# Patient Record
Sex: Female | Born: 1955
Health system: Southern US, Community
[De-identification: ages and names within clinical notes are randomized; demographics above are authoritative.]

## PROBLEM LIST (undated history)

## (undated) DIAGNOSIS — E785 Hyperlipidemia, unspecified: Secondary | ICD-10-CM

## (undated) DIAGNOSIS — I10 Essential (primary) hypertension: Secondary | ICD-10-CM

## (undated) HISTORY — DX: Hyperlipidemia, unspecified: E78.5

---

## 2000-07-03 ENCOUNTER — Emergency Department (HOSPITAL_COMMUNITY): Admission: EM | Admit: 2000-07-03 | Discharge: 2000-07-03 | Payer: Self-pay | Admitting: Emergency Medicine

## 2000-07-03 ENCOUNTER — Encounter: Payer: Self-pay | Admitting: Emergency Medicine

## 2011-08-28 ENCOUNTER — Encounter (HOSPITAL_COMMUNITY): Payer: Self-pay | Admitting: *Deleted

## 2011-08-28 ENCOUNTER — Inpatient Hospital Stay (HOSPITAL_COMMUNITY)
Admission: EM | Admit: 2011-08-28 | Discharge: 2011-08-31 | DRG: 641 | Disposition: A | Discharge status: Home / Self Care | Payer: MEDICAID | Attending: Internal Medicine | Admitting: Internal Medicine

## 2011-08-28 ENCOUNTER — Emergency Department (HOSPITAL_COMMUNITY): Payer: Self-pay

## 2011-08-28 DIAGNOSIS — R64 Cachexia: Secondary | ICD-10-CM | POA: Diagnosis present

## 2011-08-28 DIAGNOSIS — R000 Tachycardia, unspecified: Secondary | ICD-10-CM | POA: Diagnosis present

## 2011-08-28 DIAGNOSIS — F17200 Nicotine dependence, unspecified, uncomplicated: Secondary | ICD-10-CM | POA: Diagnosis present

## 2011-08-28 DIAGNOSIS — Z880 Allergy status to penicillin: Secondary | ICD-10-CM

## 2011-08-28 DIAGNOSIS — R55 Syncope and collapse: Secondary | ICD-10-CM | POA: Diagnosis present

## 2011-08-28 DIAGNOSIS — E876 Hypokalemia: Principal | ICD-10-CM

## 2011-08-28 DIAGNOSIS — I10 Essential (primary) hypertension: Secondary | ICD-10-CM | POA: Diagnosis present

## 2011-08-28 DIAGNOSIS — W19XXXA Unspecified fall, initial encounter: Secondary | ICD-10-CM | POA: Diagnosis present

## 2011-08-28 HISTORY — DX: Essential (primary) hypertension: I10

## 2011-08-28 LAB — CBC WITH DIFFERENTIAL/PLATELET
Basophils Absolute: 0 10*3/uL (ref 0.0–0.1)
Eosinophils Relative: 1 % (ref 0–5)
HCT: 36.6 % (ref 36.0–46.0)
Hemoglobin: 12.5 g/dL (ref 12.0–15.0)
Lymphocytes Relative: 32 % (ref 12–46)
MCV: 85.5 fL (ref 78.0–100.0)
Monocytes Absolute: 0.5 10*3/uL (ref 0.1–1.0)
Monocytes Relative: 8 % (ref 3–12)
RDW: 13.6 % (ref 11.5–15.5)
WBC: 6.4 10*3/uL (ref 4.0–10.5)

## 2011-08-28 LAB — HEPATIC FUNCTION PANEL
ALT: 17 U/L (ref 0–35)
AST: 37 U/L (ref 0–37)
Albumin: 3.4 g/dL — ABNORMAL LOW (ref 3.5–5.2)
Alkaline Phosphatase: 78 U/L (ref 39–117)
Bilirubin, Direct: 0.1 mg/dL (ref 0.0–0.3)
Total Bilirubin: 0.6 mg/dL (ref 0.3–1.2)

## 2011-08-28 LAB — RAPID URINE DRUG SCREEN, HOSP PERFORMED
Benzodiazepines: NOT DETECTED
Cocaine: NOT DETECTED

## 2011-08-28 LAB — URINALYSIS, ROUTINE W REFLEX MICROSCOPIC
Bilirubin Urine: NEGATIVE
Glucose, UA: NEGATIVE mg/dL
Hgb urine dipstick: NEGATIVE
Ketones, ur: NEGATIVE mg/dL
Nitrite: NEGATIVE
pH: 5.5 (ref 5.0–8.0)

## 2011-08-28 LAB — BASIC METABOLIC PANEL
BUN: 23 mg/dL (ref 6–23)
CO2: 23 mEq/L (ref 19–32)
Chloride: 102 mEq/L (ref 96–112)
Glucose, Bld: 84 mg/dL (ref 70–99)
Potassium: 2.8 mEq/L — ABNORMAL LOW (ref 3.5–5.1)
Sodium: 137 mEq/L (ref 135–145)

## 2011-08-28 MED ORDER — SODIUM CHLORIDE 0.9 % IV SOLN
Freq: Once | INTRAVENOUS | Status: AC
  Administered 2011-08-28: via INTRAVENOUS

## 2011-08-28 MED ORDER — POTASSIUM CHLORIDE CRYS ER 20 MEQ PO TBCR
40.0000 meq | EXTENDED_RELEASE_TABLET | Freq: Once | ORAL | Status: DC
  Filled 2011-08-28: qty 2

## 2011-08-28 MED ORDER — SODIUM CHLORIDE 0.9 % IV BOLUS (SEPSIS)
1000.0000 mL | Freq: Once | INTRAVENOUS | Status: AC
  Administered 2011-08-28: 1000 mL via INTRAVENOUS

## 2011-08-28 MED ORDER — POTASSIUM CHLORIDE 10 MEQ/100ML IV SOLN
10.0000 meq | Freq: Once | INTRAVENOUS | Status: AC
  Administered 2011-08-28: 10 meq via INTRAVENOUS
  Filled 2011-08-28: qty 100

## 2011-08-28 NOTE — ED Notes (Signed)
Pt receiving bolus, unable to urinate at this time,  Will in and out cath after bolus received if pt unable to urinate then

## 2011-08-28 NOTE — ED Notes (Signed)
Pt not able to urinate and is very lethargic.

## 2011-08-28 NOTE — ED Notes (Signed)
Report received from [facility],  Pt came in from home,  She was found passed out by her housekeeper,  Pt is alert and oriented x 4,  But very slow speech and unable to keep eyes open,

## 2011-08-28 NOTE — ED Notes (Signed)
Pt reports feeling generally unwell. States she had a syncopal episode. Patient sleeping intermittently and complains of right leg pain. Pt states leg seems to give out on her. Pt denies taking any medications for the pain.

## 2011-08-28 NOTE — ED Notes (Signed)
UJW:[id number]<YN> Expected date:08/28/11<BR> Expected time: 6:52 PM<BR> Means of arrival:<BR> Comments:<BR> Hold for pt in Res A

## 2011-08-28 NOTE — ED Notes (Signed)
MD is aware pt in room and ready to be seen

## 2011-08-28 NOTE — ED Notes (Signed)
Awaiting CT head results before orthostatics attempted,  Pt is very sleepy but arousable

## 2011-08-29 ENCOUNTER — Encounter (HOSPITAL_COMMUNITY): Payer: Self-pay | Admitting: *Deleted

## 2011-08-29 DIAGNOSIS — E876 Hypokalemia: Principal | ICD-10-CM

## 2011-08-29 DIAGNOSIS — R55 Syncope and collapse: Secondary | ICD-10-CM

## 2011-08-29 LAB — CBC
Hemoglobin: 10.7 g/dL — ABNORMAL LOW (ref 12.0–15.0)
MCH: 29.1 pg (ref 26.0–34.0)
MCHC: 33.2 g/dL (ref 30.0–36.0)
RDW: 13.8 % (ref 11.5–15.5)

## 2011-08-29 LAB — TSH: TSH: 0.364 u[IU]/mL (ref 0.350–4.500)

## 2011-08-29 LAB — CARDIAC PANEL(CRET KIN+CKTOT+MB+TROPI)
CK, MB: 7.9 ng/mL (ref 0.3–4.0)
CK, MB: 4.5 ng/mL — ABNORMAL HIGH (ref 0.3–4.0)
Relative Index: 0.9 (ref 0.0–2.5)
Relative Index: 0.9 (ref 0.0–2.5)
Total CK: 831 U/L — ABNORMAL HIGH (ref 7–177)
Total CK: 678 U/L — ABNORMAL HIGH (ref 7–177)
Total CK: 516 U/L — ABNORMAL HIGH (ref 7–177)
Troponin I: <0.3 ng/mL (ref <0.30)

## 2011-08-29 LAB — BASIC METABOLIC PANEL
BUN: 15 mg/dL (ref 6–23)
Creatinine, Ser: 0.77 mg/dL (ref 0.50–1.10)
GFR calc Af Amer: >90 mL/min (ref >90)
GFR calc non Af Amer: >90 mL/min (ref >90)
Glucose, Bld: 106 mg/dL — ABNORMAL HIGH (ref 70–99)
Potassium: 4 mEq/L (ref 3.5–5.1)

## 2011-08-29 LAB — HEMOGLOBIN A1C: Hgb A1c MFr Bld: 5.9 % — ABNORMAL HIGH (ref <5.7)

## 2011-08-29 LAB — PHOSPHORUS: Phosphorus: 3 mg/dL (ref 2.3–4.6)

## 2011-08-29 LAB — GLUCOSE, CAPILLARY: Glucose-Capillary: 96 mg/dL (ref 70–99)

## 2011-08-29 LAB — MAGNESIUM: Magnesium: 2 mg/dL (ref 1.5–2.5)

## 2011-08-29 MED ORDER — SODIUM CHLORIDE 0.9 % IV SOLN: INTRAVENOUS | Status: DC

## 2011-08-29 MED ORDER — SODIUM CHLORIDE 0.9 % IJ SOLN
3.0000 mL | Freq: Two times a day (BID) | INTRAMUSCULAR | Status: DC
  Administered 2011-08-29 – 2011-08-31 (×4): 3 mL via INTRAVENOUS

## 2011-08-29 MED ORDER — POTASSIUM CHLORIDE 20 MEQ/15ML (10%) PO LIQD
60.0000 meq | Freq: Once | ORAL | Status: AC
  Administered 2011-08-29: 60 meq via ORAL
  Filled 2011-08-29: qty 45

## 2011-08-29 MED ORDER — HYDROCODONE-ACETAMINOPHEN 5-325 MG PO TABS: 1.0000 | ORAL_TABLET | ORAL | Status: DC | PRN

## 2011-08-29 MED ORDER — ENOXAPARIN SODIUM 30 MG/0.3ML ~~LOC~~ SOLN
30.0000 mg | SUBCUTANEOUS | Status: DC
  Administered 2011-08-29 – 2011-08-31 (×3): 30 mg via SUBCUTANEOUS
  Filled 2011-08-29 (×3): qty 0.3

## 2011-08-29 MED ORDER — POTASSIUM CHLORIDE CRYS ER 20 MEQ PO TBCR
60.0000 meq | EXTENDED_RELEASE_TABLET | Freq: Once | ORAL | Status: DC
  Filled 2011-08-29: qty 3

## 2011-08-29 MED ORDER — ONDANSETRON HCL 4 MG PO TABS: 4.0000 mg | ORAL_TABLET | Freq: Four times a day (QID) | ORAL | Status: DC | PRN

## 2011-08-29 MED ORDER — MORPHINE SULFATE 2 MG/ML IJ SOLN: 1.0000 mg | INTRAMUSCULAR | Status: DC | PRN

## 2011-08-29 MED ORDER — ONDANSETRON HCL 4 MG/2ML IJ SOLN: 4.0000 mg | Freq: Four times a day (QID) | INTRAMUSCULAR | Status: DC | PRN

## 2011-08-29 NOTE — Progress Notes (Signed)
VASCULAR LAB PRELIMINARY  PRELIMINARY  PRELIMINARY  PRELIMINARY  Carotid Dopplers completed.    Preliminary report:  There is no ICA stenosis.  Vertebral artery flow is antegrade.  Dorothy Polhemus, 08/29/2011, 12:59 PM

## 2011-08-29 NOTE — ED Notes (Signed)
Lab called to report CKMB 7.7

## 2011-08-29 NOTE — ED Provider Notes (Addendum)
History     CSN: [id number]  Arrival date & time 08/28/11  1843   First MD Initiated Contact with Patient 08/28/11 1926      Chief Complaint  Patient presents with  . Loss of Consciousness  . Leg Pain  . Fatigue    (Consider location/radiation/quality/duration/timing/severity/associated sxs/prior treatment) HPI Comments: Patient with no known medical hx comes in with cc of syncope. Pt reports to being generally weak over the past few days. She has no n/v/f/c. She has no hx of thyroid disease. She has no abd pain, diarrhea - but does have loss of appetite. Pt has no chest pain, sob, blood loss. Earlier today, patient had an un witnessed syncopal episode. She was trying to get up, and just passed out. She had no chest pain, palpitations, sob prior to or after the syncope no hx ofd seizures, and there was no incontinence. Pt has no hx of PE, DVt, and no risk factors for the same.  The history is provided by the patient and a relative.    Past Medical History  Diagnosis Date  . Hypertension     History reviewed. No pertinent past surgical history.  No family history on file.  History  Substance Use Topics  . Smoking status: Current Everyday Smoker  . Smokeless tobacco: Not on file  . Alcohol Use: No    OB History    Grav Para Term Preterm Abortions TAB SAB Ect Mult Living                  Review of Systems  Constitutional: Positive for appetite change and fatigue. Negative for fever, diaphoresis, activity change and unexpected weight change.  HENT: Negative for facial swelling and neck pain.   Respiratory: Negative for cough, shortness of breath and wheezing.   Cardiovascular: Negative for chest pain.  Gastrointestinal: Negative for nausea, vomiting, abdominal pain, diarrhea, constipation, blood in stool and abdominal distention.  Genitourinary: Negative for dysuria, hematuria and difficulty urinating.  Musculoskeletal: Negative for arthralgias.  Skin: Negative for  color change.  Neurological: Positive for syncope, weakness and light-headedness. Negative for speech difficulty.  Hematological: Does not bruise/bleed easily.  Psychiatric/Behavioral: Negative for confusion.    Allergies  Penicillins  Home Medications  No current outpatient prescriptions on file.  BP 132/91  Pulse 92  Temp 99.8 F (37.7 C) (Oral)  Resp 19  SpO2 100%  Physical Exam  Constitutional: She is oriented to person, place, and time. She appears well-developed.  HENT:  Head: Normocephalic and atraumatic.  Eyes: Conjunctivae and EOM are normal. Pupils are equal, round, and reactive to light.  Neck: Normal range of motion. Neck supple.  Cardiovascular: Normal rate, regular rhythm and normal heart sounds.   Pulmonary/Chest: Effort normal and breath sounds normal. No respiratory distress.  Abdominal: Soft. Bowel sounds are normal. She exhibits no distension. There is no tenderness. There is no rebound and no guarding.  Neurological: She is alert and oriented to person, place, and time.  Skin: Skin is warm and dry.    ED Course  Procedures (including critical care time)  Labs Reviewed  URINE RAPID DRUG SCREEN (HOSP PERFORMED) - Abnormal; Notable for the following:    Tetrahydrocannabinol POSITIVE (*)     All other components within normal limits  BASIC METABOLIC PANEL - Abnormal; Notable for the following:    Potassium 2.8 (*)     GFR calc non Af Amer 58 (*)     GFR calc Af Amer 67 (*)  All other components within normal limits  HEPATIC FUNCTION PANEL - Abnormal; Notable for the following:    Albumin 3.4 (*)     All other components within normal limits  SALICYLATE LEVEL - Abnormal; Notable for the following:    Salicylate Lvl <2.0 (*)     All other components within normal limits  CBC WITH DIFFERENTIAL  URINALYSIS, ROUTINE W REFLEX MICROSCOPIC  ACETAMINOPHEN LEVEL  TROPONIN I  D-DIMER, QUANTITATIVE   Dg Chest 2 View  08/28/2011  *RADIOLOGY REPORT*   Clinical Data: Chest pain.  CHEST - 2 VIEW  Comparison: None.  Findings: Mild cardiomegaly.  Clear lungs.  Normal vascularity.  No pneumothorax and no pleural effusion.  IMPRESSION: Cardiomegaly without edema.  Original Report Authenticated By: Donavan Burnet, M.D.   Ct Head Wo Contrast  08/28/2011  *RADIOLOGY REPORT*  Clinical Data: 56 year old female with loss of consciousness, syncope and speech disturbance.  CT HEAD WITHOUT CONTRAST  Technique:  Contiguous axial images were obtained from the base of the skull through the vertex without contrast.  Comparison: None  Findings: No intracranial abnormalities are identified, including mass lesion or mass effect, hydrocephalus, extra-axial fluid collection, midline shift, hemorrhage, or acute infarction.  The visualized bony calvarium is unremarkable.  IMPRESSION: Unremarkable noncontrast head CT  Original Report Authenticated By: Rosendo Gros, M.D.     No diagnosis found.    MDM   Date: 08/29/2011  Rate: 95  Rhythm: normal sinus rhythm  QRS Axis: normal  Intervals: normal  ST/T Wave abnormalities: normal  Conduction Disutrbances:none  Narrative Interpretation:   Old EKG Reviewed: none available  Patient with no medical hx comes in with cc of syncope. Pt had a syncopal episode earlier today, un witnessed. EKG shows LBH, and some non specific T wave changes, but nothing acute. Pt appears to be cachectic, and labile. She states that she was doing well until just recently. We will get basic labs. Wewill get d-dimer as well for PE. Will consider admission based on the findings.   1:19 AM Patient's potassium is 2.8. She has no other lab abnormality. EKG - no u waves, but there might be some suttle deflection buried with t waves.... With syncope and hypokalemia, and general cachectic appearance - we will try to admit.         Derwood Kaplan, MD 08/29/11 1610  Derwood Kaplan, MD 08/29/11 9604

## 2011-08-29 NOTE — Progress Notes (Signed)
Attempted to returns sister's call. Permission to speak with her from pt. No answer.

## 2011-08-29 NOTE — ED Notes (Signed)
Patient given Potassium mixed in [place] Juice. Patient took one sip and stated "I can't drank that."  Encouraged patient to try to drink mixture to facilitate potassium replacement. Patient became tearful stating that she just cannot drink it.

## 2011-08-29 NOTE — H&P (Signed)
[facility] History and Physical  Raven Buckley MVH:[id number] DOB: [birth year] DOA: 08/28/2011  Referring physician: ED physician PCP: No primary provider on file.   Chief Complaint: Fall  HPI:  Pt is 56 yo female with no known significant past medical history who presents today to [facility] with main concern of one episode of "passing out" earlier today. She denies similar episodes in the past but family reports she has had similar things happen to her but they are unsure of the details. Pt denies any known provoking symptoms, no chest pain or shortness of breath, no dizziness, no headaches, no focal neurologic weakness. Pt denies recent sicknesses or hospitalizations, no other systemic symptoms of weight loss, night sweats.  Assessment and Plan: Active Problems:  Syncope - unclear etiology at this time - will admit the pt to telemetry floor for further evaluation and management - will obtain CE's, TSH, orthostatic vitals, monitor on tele - provide supportive care with IVF, analgesia as needed for symptom control - monitor vitals per floor protocol - PT evaluation   Hypokalemia - unclear etiology at this time - pt denies any diarrhea and is not taking any medications - will check Mg level - supplemented in ED - BMP in AM  Code Status: Full Family Communication: Pt at bedside Disposition Plan: PT evaluation    Review of Systems:  Constitutional: Negative for fever, chills and malaise/fatigue. Negative for diaphoresis.  HENT: Negative for hearing loss, ear pain, nosebleeds, congestion, sore throat, neck pain, tinnitus and ear discharge.   Eyes: Negative for blurred vision, double vision, photophobia, pain, discharge and redness.  Respiratory: Negative for cough, hemoptysis, sputum production, shortness of breath, wheezing and stridor.   Cardiovascular: Negative for chest pain, palpitations, orthopnea, claudication and leg swelling.  Gastrointestinal: Negative for nausea,  vomiting and abdominal pain. Negative for heartburn, constipation, blood in stool and melena.  Genitourinary: Negative for dysuria, urgency, frequency, hematuria and flank pain.  Musculoskeletal: Negative for myalgias, back pain, joint pain, positive for fall.  Skin: Negative for itching and rash.  Neurological: Negative for dizziness and weakness. Negative for tingling, tremors, sensory change, speech change, focal weakness, loss of consciousness and headaches.  Endo/Heme/Allergies: Negative for environmental allergies and polydipsia. Does not bruise/bleed easily.  Psychiatric/Behavioral: Negative for suicidal ideas. The patient is not nervous/anxious.      Past Medical History  Diagnosis Date  . Hypertension     History reviewed. No pertinent past surgical history.  Social History:  reports that she has been smoking.  She does not have any smokeless tobacco history on file. She reports that she uses illicit drugs (Marijuana). She reports that she does not drink alcohol.  Allergies  Allergen Reactions  . Penicillins Rash    No history of heart or lung problems, no cancers in family   Prior to Admission medications   Not on File    Physical Exam: Filed Vitals:   08/28/11 1853 08/28/11 2131 08/28/11 2132  BP: 125/66 129/83 132/91  Pulse: 103 87 92  Temp: 99.5 F (37.5 C) 99.8 F (37.7 C)   TempSrc: Oral Oral   Resp: 37 19   SpO2: 99% 100%     Physical Exam  Constitutional: Appears cachectic. No distress.  HENT: Normocephalic. External right and left ear normal. Oropharynx is clear and moist.  Eyes: Conjunctivae and EOM are normal. PERRLA, no scleral icterus.  Neck: Normal ROM. Neck supple. No JVD. No tracheal deviation. No thyromegaly.  CVS: Regular rhythm, tachycardic, S1/S2 +, no  murmurs, no gallops, no carotid bruit.  Pulmonary: Effort and breath sounds normal, no stridor, rhonchi, wheezes, rales.  Abdominal: Soft. BS +,  no distension, tenderness, rebound or  guarding.  Musculoskeletal: Normal range of motion. No edema and no tenderness.  Lymphadenopathy: No lymphadenopathy noted, cervical, inguinal. Neuro: Alert. Normal reflexes, muscle tone coordination. No cranial nerve deficit. Skin: Skin is warm and dry. No rash noted. Not diaphoretic. No erythema. No pallor.  Psychiatric: Normal mood and affect. Behavior, judgment, thought content normal.   Labs on Admission:  Basic Metabolic Panel:  Lab 08/28/11 4098  NA 137  K 2.8*  CL 102  CO2 23  GLUCOSE 84  BUN 23  CREATININE 1.06  CALCIUM 8.8  MG --  PHOS --   Liver Function Tests:  Lab 08/28/11 2126  AST 37  ALT 17  ALKPHOS 78  BILITOT 0.6  PROT 6.4  ALBUMIN 3.4*   CBC:  Lab 08/28/11 1857  WBC 6.4  NEUTROABS 3.8  HGB 12.5  HCT 36.6  MCV 85.5  PLT 354   Cardiac Enzymes:  Lab 08/28/11 2126  CKTOTAL --  CKMB --  CKMBINDEX --  TROPONINI <0.30    Radiological Exams on Admission: Dg Chest 2 View  08/28/2011  *RADIOLOGY REPORT*  Clinical Data: Chest pain.  CHEST - 2 VIEW  Comparison: None.  Findings: Mild cardiomegaly.  Clear lungs.  Normal vascularity.  No pneumothorax and no pleural effusion.  IMPRESSION: Cardiomegaly without edema.  Original Report Authenticated By: Donavan Burnet, M.D.   Ct Head Wo Contrast  08/28/2011  *RADIOLOGY REPORT*  Clinical Data: 56 year old female with loss of consciousness, syncope and speech disturbance.  CT HEAD WITHOUT CONTRAST  Technique:  Contiguous axial images were obtained from the base of the skull through the vertex without contrast.  Comparison: None  Findings: No intracranial abnormalities are identified, including mass lesion or mass effect, hydrocephalus, extra-axial fluid collection, midline shift, hemorrhage, or acute infarction.  The visualized bony calvarium is unremarkable.  IMPRESSION: Unremarkable noncontrast head CT  Original Report Authenticated By: Rosendo Gros, M.D.    EKG: Normal sinus rhythm, no ST/T wave  changes  Debbora Presto, MD  [facility] Pager [phone]  If 7PM-7AM, please contact night-coverage [email or web address] Password [email or web address] 08/29/2011, 2:01 AM

## 2011-08-29 NOTE — Progress Notes (Signed)
I have seen and examined patient admitted this a.m. per Dr. Izola Price with no known significant past medical history who presented with complaints of syncopal episode/fall. She denies any dizziness or chest pain at this time. Will follow cardiac enzymes, also obtain carotid Dopplers and 2-D echocardiogram follow and further manage accordingly.  Roanna Epley [facility] [phone]

## 2011-08-30 NOTE — Evaluation (Signed)
Physical Therapy One Time Evaluation and D/C from acute PT Patient Details Name: Raven Buckley MRN: [id number] DOB: 1955/12/19 Today's Date: 08/30/2011 Time: 0950-1007 PT Time Calculation (min): 17 min  PT Assessment / Plan / Recommendation Clinical Impression  Pt admitted for syncope.  Pt denied dizziness and lightheadedness with all transfers and mobility.  Orthostatics taken and entered in doc flowsheets (BP did not drop).  Pt appears to be at baseline and no further PT needs identified at this time.    PT Assessment  Patent does not need any further PT services    Follow Up Recommendations  No PT follow up    Barriers to Discharge        Equipment Recommendations  None recommended by PT    Recommendations for Other Services     Frequency      Precautions / Restrictions Precautions Precautions: Fall (syncope)   Pertinent Vitals/Pain No pain Orthostatics: Supine: 131/72 mmHg HR 80 Sitting: 133/84 mmHg HR 79 Standing: 144/80 mmHg HR 90      Mobility  Bed Mobility Bed Mobility: Sit to Supine;Supine to Sit Supine to Sit: 7: Independent Sit to Supine: 7: Independent Transfers Transfers: Sit to Stand;Stand to Sit Sit to Stand: 6: Modified independent (Device/Increase time) Stand to Sit: 6: Modified independent (Device/Increase time) Ambulation/Gait Ambulation/Gait Assistance: 6: Modified independent (Device/Increase time) Ambulation Distance (Feet): 250 Feet Assistive device: None Ambulation/Gait Assistance Details: pt denies dizziness and SOB, SaO2 100% room air upon returning to room Gait Pattern: Within Functional Limits    Exercises     PT Diagnosis:    PT Problem List:   PT Treatment Interventions:     PT Goals    Visit Information  Last PT Received On: 08/30/11 Assistance Needed: +1    Subjective Data  Subjective: 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   Prior Functioning  Home Living Lives With: Alone Type of Home: House Home Access: Stairs to  enter Secretary/administrator of Steps: 4 Entrance Stairs-Rails: Right Home Layout: One level Home Adaptive Equipment: None Prior Function Level of Independence: Independent Communication Communication: No difficulties    Cognition  Overall Cognitive Status: Appears within functional limits for tasks assessed/performed Arousal/Alertness: Awake/alert Orientation Level: Appears intact for tasks assessed Behavior During Session: [facility] for tasks performed    Extremity/Trunk Assessment Right Upper Extremity Assessment RUE ROM/Strength/Tone: [facility] for tasks assessed Left Upper Extremity Assessment LUE ROM/Strength/Tone: [facility] for tasks assessed Right Lower Extremity Assessment RLE ROM/Strength/Tone: [facility] for tasks assessed Left Lower Extremity Assessment LLE ROM/Strength/Tone: [facility] for tasks assessed   Balance    End of Session PT - End of Session Activity Tolerance: Patient tolerated treatment well Patient left: in bed;with call bell/phone within reach;with bed alarm set  GP     Raven Buckley,Raven Buckley 08/30/2011, 10:20 AM Pager: [phone]

## 2011-08-30 NOTE — Progress Notes (Signed)
Clinical Social Work Department BRIEF PSYCHOSOCIAL ASSESSMENT 08/30/2011  Patient:  Raven Buckley, Raven Buckley     Account Number:  [id number]     Admit date:  08/28/2011  Clinical Social Worker:  Leron Croak, CLINICAL SOCIAL WORKER  Date/Time:  08/30/2011 04:58 PM  Referred by:  Physician  Date Referred:  08/30/2011 Referred for  Other - See comment   Other Referral:   Financial resources   Interview type:  Patient Other interview type:    PSYCHOSOCIAL DATA Living Status:  ALONE Admitted from facility:   Level of care:   Primary support name:  Raven Buckley Primary support relationship to patient:  NONE Degree of support available:    CURRENT CONCERNS Current Concerns  Financial Resources   Other Concerns:    SOCIAL WORK ASSESSMENT / PLAN CSW met with Pt to discuss financial resources. Pt accepted information however did not want to discuss it at the time. F/U may be needed.   Assessment/plan status:  Information/Referral to [facility] Other assessment/ plan:   Information/referral to community resources:   CSW provided IT trainer.    PATIENT'S/FAMILY'S RESPONSE TO PLAN OF CARE: Pt was appreciative for assistance and will address any further concerns with weekday CSW.      Leron Croak, LCSWA [employer] [phone]

## 2011-08-30 NOTE — Progress Notes (Signed)
INITIAL ADULT NUTRITION ASSESSMENT Date: 08/30/2011   Time: 9:47 AM  Reason for Assessment: Nutrition risk, appears malnourished  INTERVENTION: 1. Encourage adequate intake, patient declines supplements at this time.  2. Patient may benefit from social work consult to assist with financial resources after discharge. 3. RD to follow plan of care  ASSESSMENT: Female 56 y.o.  Dx: Syncope  Hx:  Past Medical History  Diagnosis Date  . Hypertension     Related Meds:     . enoxaparin (LOVENOX) injection  30 mg Subcutaneous Q24H  . potassium chloride  40 mEq Oral Once  . sodium chloride  3 mL Intravenous Q12H    Ht: 4\' 11"  (149.9 cm)  Wt: 105 lb 2.6 oz (47.7 kg)  Ideal Wt: 43.2 kg  % Ideal Wt: 110%  Usual Wt: Unknown % Usual Wt: Unknown  Body mass index is 21.24 kg/(m^2).  Food/Nutrition Related Hx: Patient reports a poor appetite PTA for the last few months. She does report weight loss, but is unsure of UBW.   Labs:  CMP     Component Value Date/Time   NA 138 08/29/2011 0639   K 4.0 08/29/2011 0639   CL 108 08/29/2011 0639   CO2 22 08/29/2011 0639   GLUCOSE 106* 08/29/2011 0639   BUN 15 08/29/2011 0639   CREATININE 0.77 08/29/2011 0639   CALCIUM 8.6 08/29/2011 0639   PROT 6.4 08/28/2011 2126   ALBUMIN 3.4* 08/28/2011 2126   AST 37 08/28/2011 2126   ALT 17 08/28/2011 2126   ALKPHOS 78 08/28/2011 2126   BILITOT 0.6 08/28/2011 2126   GFRNONAA >90 08/29/2011 0639   GFRAA >90 08/29/2011 0639    Intake/Output Summary (Last 24 hours) at 08/30/11 0950 Last data filed at 08/29/11 2300  Gross per 24 hour  Intake    393 ml  Output      0 ml  Net    393 ml    Diet Order: Regular, 75% completion  Supplements/Tube Feeding: None  IVF:    sodium chloride Last Rate: Stopped (08/29/11 2030)    Estimated Nutritional Needs:   Kcal: 1300-1400 kcal Protein: 55-70 g Fluid: 1.7 L  Patient reports a poor appetite for several months PTA, with unspecified weight loss. Her  appetite has improved since admission with at least 75% meal completion. She reports being unemployed for the last 6 months, and requires assistance. Suspect some degree of malnutrition due to patient report of poor PO intake and weight loss. She declines supplements at this time.   NUTRITION DIAGNOSIS: -Inadequate oral intake (NI-2.1).  Status: Ongoing  RELATED TO: decreased appetite  AS EVIDENCE BY: unspecified weight loss over 3 months  MONITORING/EVALUATION(Goals): Patient will meet 90-100% of estimated nutrition needs.  Monitor: PO intake, weight, labs  EDUCATION NEEDS: -No education needs identified at this time     DOCUMENTATION CODES Per approved criteria  -Not Applicable    Raven Buckley 08/30/2011, 9:47 AM

## 2011-08-30 NOTE — Progress Notes (Signed)
[facility] PROGRESS NOTE  Raven Buckley NGE:[id number] DOB: 1955/11/19 DOA: 08/28/2011 PCP: No primary provider on file.  Assessment/Plan: Active Problems:  Syncope -pt is not orthostatic, and CEs neg, TSH wnl. -carotid dopplers neg, will await echo   Hypokalemia -resolved   Code Status: full Family Communication:  Disposition Plan: home when medically stable   Brief narrative: Pt is 56 yo female with no known significant past medical history who presents today to [facility] with main concern of one episode of "passing out" earlier today. She denies similar episodes in the past but family reports she has had similar things happen to her but they are unsure of the details. Pt denies any known provoking symptoms, no chest pain or shortness of breath, no dizziness, no headaches, no focal neurologic weakness.She was admitted for further eval. And management   Consultants:  none  Procedures:   Carotid Dopplers.  Preliminary report: There is no ICA stenosis. Vertebral artery flow is antegrade  -Echo-pending  Antibiotics:  none  HPI/Subjective: Pt denies any c/o today, ambulated with PT w/o difficulty  Objective: Filed Vitals:   08/30/11 0532 08/30/11 0950 08/30/11 0951 08/30/11 0952  BP: 116/76 131/72 133/84 144/80  Pulse: 76 80 79 90  Temp: 98.1 F (36.7 C)     TempSrc: Oral     Resp: 18     Height:      Weight:    47.5 kg (104 lb 11.5 oz)  SpO2: 100%   96%    Intake/Output Summary (Last 24 hours) at 08/30/11 1202 Last data filed at 08/29/11 2300  Gross per 24 hour  Intake    393 ml  Output      0 ml  Net    393 ml    Exam:   General:  Looks older than stated age, in NAD  Cardiovascular: RRR, nl S1S2  Respiratory: clear  Abdomen: soft,+BS, NT/ND  EXT: no cyanosis, no edema  Data Reviewed: Basic Metabolic Panel:  Lab 08/29/11 4010 08/29/11 0214 08/28/11 2126  NA 138 -- 137  K 4.0 -- 2.8*  CL 108 -- 102  CO2 22 -- 23  GLUCOSE 106* -- 84    BUN 15 -- 23  CREATININE 0.77 -- 1.06  CALCIUM 8.6 -- 8.8  MG -- 2.0 --  PHOS -- 3.0 --   Liver Function Tests:  Lab 08/28/11 2126  AST 37  ALT 17  ALKPHOS 78  BILITOT 0.6  PROT 6.4  ALBUMIN 3.4*   No results found for this basename: LIPASE:5,AMYLASE:5 in the last 168 hours No results found for this basename: AMMONIA:5 in the last 168 hours CBC:  Lab 08/29/11 0639 08/28/11 1857  WBC 4.6 6.4  NEUTROABS -- 3.8  HGB 10.7* 12.5  HCT 32.2* 36.6  MCV 87.5 85.5  PLT 266 354   Cardiac Enzymes:  Lab 08/29/11 1804 08/29/11 0943 08/29/11 0215 08/28/11 2126  CKTOTAL 516* 678* 831* --  CKMB 4.5* 5.9* 7.9* --  CKMBINDEX -- -- -- --  TROPONINI <0.30 <0.30 <0.30 <0.30   BNP (last 3 results) No results found for this basename: PROBNP:3 in the last 8760 hours CBG:  Lab 08/29/11 0800  GLUCAP 96    No results found for this or any previous visit (from the past 240 hour(s)).   Studies: Dg Chest 2 View  08/28/2011  *RADIOLOGY REPORT*  Clinical Data: Chest pain.  CHEST - 2 VIEW  Comparison: None.  Findings: Mild cardiomegaly.  Clear lungs.  Normal vascularity.  No pneumothorax and no pleural effusion.  IMPRESSION: Cardiomegaly without edema.  Original Report Authenticated By: Donavan Burnet, M.D.   Ct Head Wo Contrast  08/28/2011  *RADIOLOGY REPORT*  Clinical Data: 56 year old female with loss of consciousness, syncope and speech disturbance.  CT HEAD WITHOUT CONTRAST  Technique:  Contiguous axial images were obtained from the base of the skull through the vertex without contrast.  Comparison: None  Findings: No intracranial abnormalities are identified, including mass lesion or mass effect, hydrocephalus, extra-axial fluid collection, midline shift, hemorrhage, or acute infarction.  The visualized bony calvarium is unremarkable.  IMPRESSION: Unremarkable noncontrast head CT  Original Report Authenticated By: Rosendo Gros, M.D.    Scheduled Meds:   . enoxaparin (LOVENOX) injection   30 mg Subcutaneous Q24H  . potassium chloride  40 mEq Oral Once  . sodium chloride  3 mL Intravenous Q12H   Continuous Infusions:   . sodium chloride Stopped (08/29/11 2030)    Active Problems:  Syncope  Hypokalemia    Time spent: 25    [facility] C  [facility] Pager [phone] If 8PM-8AM, please contact night-coverage at [email or web address], password [facility] 08/30/2011, 12:02 PM  LOS: 2 days

## 2011-08-31 LAB — CBC
HCT: 33.3 % — ABNORMAL LOW (ref 36.0–46.0)
Hemoglobin: 11 g/dL — ABNORMAL LOW (ref 12.0–15.0)
MCH: 29.3 pg (ref 26.0–34.0)
MCHC: 33 g/dL (ref 30.0–36.0)
MCV: 88.8 fL (ref 78.0–100.0)
RBC: 3.75 MIL/uL — ABNORMAL LOW (ref 3.87–5.11)

## 2011-08-31 LAB — GLUCOSE, CAPILLARY: Glucose-Capillary: 118 mg/dL — ABNORMAL HIGH (ref 70–99)

## 2011-08-31 MED ORDER — ENOXAPARIN SODIUM 40 MG/0.4ML ~~LOC~~ SOLN: 40.0000 mg | Freq: Every day | SUBCUTANEOUS | Status: DC

## 2011-08-31 NOTE — Progress Notes (Signed)
CSW met with patient. Patient denies need for further CSW services. Patient states that she will talk with financial counselor regarding the bill and has already spoken to her landlord about her housing concerns. Neelah Mannings C. [place] Fant MSW, LCSW [phone]

## 2011-08-31 NOTE — Care Management Note (Unsigned)
    Page 1 of 1   08/31/2011     11:37:13 AM   CARE MANAGEMENT NOTE 08/31/2011  Patient:  Raven Buckley, Raven Buckley   Account Number:  [id number]  Date Initiated:  08/31/2011  Documentation initiated by:  Lanier Clam  Subjective/Objective Assessment:   ADMITTED W/SYNCOPE.     Action/Plan:   FROM HOME ALONE   Anticipated DC Date:  09/04/2011   Anticipated DC Plan:  HOME/SELF CARE  In-house referral  Clinical Social Worker  Artist      DC Planning Services  CM consult      Choice offered to / List presented to:             Status of service:  In process, will continue to follow Medicare Important Message given?   (If response is "NO", the following Medicare IM given date fields will be blank) Date Medicare IM given:   Date Additional Medicare IM given:    Discharge Disposition:    Per UR Regulation:  Reviewed for med. necessity/level of care/duration of stay  If discussed at Long Length of Stay Meetings, dates discussed:    Comments:  08/31/11 Azelie Noguera RN,BSN NCM [phone] PATIENT STATES SHE HAS BEEN UNEMPLOYED OVER PAST 6 MONTHS,& HER UNEMPLOYMENT BENEFITS HAVE ENDED.INFORMED HER TO CONTACT DEPT OF SOCIAL SERVICES.ALSO CONTACTED FINANCIAL COUNSELOR WHO TALK TO HER ABOUT FINANCIAL RESOURCES.PROVIDED HER W/PCP LISTING FOR [facility]-SHE MUST CALL THEM ONCE D/C TO SET UP ELIGIBILITY APPT/NEW PATIENT REFERRAL,FEE$20.PROVIDED HER W/$[employer] MED LIST.QUALIFIES FOR INDIGENT FUNDS.INFORMED OF PHARMACY POLICY 1XUSE/3DAY SUPPLY/NO NARCOTICS.SHE SAYS SHE IS IN NEED OF FOOD STAMPS,& HER HOME IS NOT SAFE.CSW INFORMED.

## 2011-08-31 NOTE — Progress Notes (Signed)
*  PRELIMINARY RESULTS* Echocardiogram 2D Echocardiogram has been performed.  Raven Buckley 08/31/2011, 3:35 PM

## 2011-08-31 NOTE — Discharge Summary (Signed)
Physician Discharge Summary  Raven Buckley ZOX:[id number] DOB: 09-Jul-1955 DOA: 08/28/2011  PCP: No primary provider on file.  Admit date: 08/28/2011 Discharge date: 08/31/2011  Recommendations for Outpatient Follow-up:  Follow-up Information    Please follow up. [facility], call [phone] for appointment upon discharge)         Discharge Diagnoses:  Active Problems:  Syncope  Hypokalemia   Discharge Condition: improved/stable  Diet recommendation: regular  Wt Readings from Last 3 Encounters:  08/30/11 47.5 kg (104 lb 11.5 oz)    History of present illness:  Pt is 56 yo female with no known significant past medical history who presents today to [facility] with main concern of one episode of "passing out" earlier today. She denies similar episodes in the past but family reports she has had similar things happen to her but they are unsure of the details. Pt denies any known provoking symptoms, no chest pain or shortness of breath, no dizziness, no headaches, no focal neurologic weakness.She was admitted for further eval. And management.   Hospital Course by hospital course:  Syncope As discussed above, upon admission the patient had cardiac enzymes cycled and they came back negative. Orthostatic vitals were done and she was not orthostatic. Carotid Doppler ultrasound was done and came back negative, a 2-D echocardiogram was done to further evaluate and showed an ejection fraction of 60-65% with normal wall motion. Patient had also had a CT scan of the head done on admission which was unremarkable-no acute infarction. PT OT was consulted to see patient and she was ambulating without difficulty and old skilled PT needs identified. The impression was that her syncope was possibly vasovagal. She remained asymptomatic during her hospital stay. A TSH was also done and came back within normal limits. She was seen by case management and and given information [facility] to followup for PCP. She was also seen by social worker during this hospital stay, and  indicated that she had sorted out her housing issues with her landlord. Hypokalemia  Her potassium was replaced during this hospital stay    Consultants:  none Procedures:  Carotid Dopplers.  Preliminary report: There is no ICA stenosis. Vertebral artery flow is antegrade  -Echo- Study Conclusions  - Left ventricle: The cavity size was normal. Systolic function was normal. The estimated ejection fraction was in the range of 60% to 65%. Wall motion was normal; there were no regional wall motion abnormalities. - Mitral valve: Systolic bowing without clear prolapse. Mild regurgitation. - Tricuspid valve: Mild prolapse. Transthoracic echocardiography.   Discharge Exam: Filed Vitals:   08/31/11 0509  BP: 137/82  Pulse: 60  Temp: 98.2 F (36.8 C)  Resp: 16   Filed Vitals:   08/30/11 0952 08/30/11 1428 08/30/11 2036 08/31/11 0509  BP: 144/80 122/79 149/76 137/82  Pulse: 90 84 70 60  Temp:  98.8 F (37.1 C) 98.8 F (37.1 C) 98.2 F (36.8 C)  TempSrc:  Oral Oral Oral  Resp:  18 16 16   Height:      Weight: 47.5 kg (104 lb 11.5 oz)     SpO2: 96%  100% 100%   General: Looks older than stated age, in NAD  Cardiovascular: RRR, nl S1S2  Respiratory: clear  Abdomen: soft,+BS, NT/ND  EXT: no cyanosis, no edema  Discharge Instructions  Discharge Orders    Future Orders Please Complete By Expires   Diet general      Increase activity slowly  Medication List    Notice       You have not been prescribed any medications.            Follow-up Information    Please follow up. [facility], call [phone] for appointment upon discharge)           The results of significant diagnostics from this hospitalization (including imaging, microbiology, ancillary and laboratory) are listed below for reference.    Significant  Diagnostic Studies: Dg Chest 2 View  08/28/2011  *RADIOLOGY REPORT*  Clinical Data: Chest pain.  CHEST - 2 VIEW  Comparison: None.  Findings: Mild cardiomegaly.  Clear lungs.  Normal vascularity.  No pneumothorax and no pleural effusion.  IMPRESSION: Cardiomegaly without edema.  Original Report Authenticated By: Raven Buckley, M.D.   Ct Head Wo Contrast  08/28/2011  *RADIOLOGY REPORT*  Clinical Data: 57 year old female with loss of consciousness, syncope and speech disturbance.  CT HEAD WITHOUT CONTRAST  Technique:  Contiguous axial images were obtained from the base of the skull through the vertex without contrast.  Comparison: None  Findings: No intracranial abnormalities are identified, including mass lesion or mass effect, hydrocephalus, extra-axial fluid collection, midline shift, hemorrhage, or acute infarction.  The visualized bony calvarium is unremarkable.  IMPRESSION: Unremarkable noncontrast head CT  Original Report Authenticated By: Raven Buckley, M.D.    Microbiology: No results found for this or any previous visit (from the past 240 hour(s)).   Labs: Basic Metabolic Panel:  Lab 08/29/11 7253 08/29/11 0214 08/28/11 2126  NA 138 -- 137  K 4.0 -- 2.8*  CL 108 -- 102  CO2 22 -- 23  GLUCOSE 106* -- 84  BUN 15 -- 23  CREATININE 0.77 -- 1.06  CALCIUM 8.6 -- 8.8  MG -- 2.0 --  PHOS -- 3.0 --   Liver Function Tests:  Lab 08/28/11 2126  AST 37  ALT 17  ALKPHOS 78  BILITOT 0.6  PROT 6.4  ALBUMIN 3.4*   No results found for this basename: LIPASE:5,AMYLASE:5 in the last 168 hours No results found for this basename: AMMONIA:5 in the last 168 hours CBC:  Lab 08/31/11 0413 08/29/11 0639 08/28/11 1857  WBC 5.0 4.6 6.4  NEUTROABS -- -- 3.8  HGB 11.0* 10.7* 12.5  HCT 33.3* 32.2* 36.6  MCV 88.8 87.5 85.5  PLT 283 266 354   Cardiac Enzymes:  Lab 08/29/11 1804 08/29/11 0943 08/29/11 0215 08/28/11 2126  CKTOTAL 516* 678* 831* --  CKMB 4.5* 5.9* 7.9* --  CKMBINDEX -- -- --  --  TROPONINI <0.30 <0.30 <0.30 <0.30   BNP: BNP (last 3 results) No results found for this basename: PROBNP:3 in the last 8760 hours CBG:  Lab 08/31/11 0857 08/29/11 0800  GLUCAP 118* 96    Time coordinating discharge: <80mins minutes  Signed:  Chandria Rookstool C  [facility] 08/31/2011, 5:31 PM

## 2011-08-31 NOTE — Progress Notes (Signed)
Pt verbalizes understanding of d/c instructions.  Awaiting brother for pick up. dph

## 2011-08-31 NOTE — Progress Notes (Signed)
MD paged re: slight T Wave elevation compared to 0845 monitor strip.  Pt is asymptomatic.  dph

## 2012-09-09 ENCOUNTER — Other Ambulatory Visit (HOSPITAL_COMMUNITY): Payer: Self-pay | Admitting: Nurse Practitioner

## 2012-09-09 DIAGNOSIS — Z1231 Encounter for screening mammogram for malignant neoplasm of breast: Secondary | ICD-10-CM

## 2012-09-12 ENCOUNTER — Ambulatory Visit (HOSPITAL_COMMUNITY): Payer: Self-pay | Attending: Nurse Practitioner

## 2016-01-07 ENCOUNTER — Encounter (HOSPITAL_COMMUNITY): Payer: Self-pay

## 2016-01-07 ENCOUNTER — Emergency Department (HOSPITAL_COMMUNITY): Payer: 59

## 2016-01-07 ENCOUNTER — Emergency Department (HOSPITAL_COMMUNITY)
Admission: EM | Admit: 2016-01-07 | Discharge: 2016-01-07 | Disposition: A | Discharge status: Home / Self Care | Payer: 59 | Attending: Emergency Medicine | Admitting: Emergency Medicine

## 2016-01-07 DIAGNOSIS — R55 Syncope and collapse: Secondary | ICD-10-CM | POA: Insufficient documentation

## 2016-01-07 DIAGNOSIS — F17200 Nicotine dependence, unspecified, uncomplicated: Secondary | ICD-10-CM | POA: Insufficient documentation

## 2016-01-07 DIAGNOSIS — I493 Ventricular premature depolarization: Secondary | ICD-10-CM | POA: Diagnosis not present

## 2016-01-07 DIAGNOSIS — I10 Essential (primary) hypertension: Secondary | ICD-10-CM | POA: Diagnosis not present

## 2016-01-07 DIAGNOSIS — I499 Cardiac arrhythmia, unspecified: Secondary | ICD-10-CM | POA: Diagnosis present

## 2016-01-07 LAB — CBC WITH DIFFERENTIAL/PLATELET
Basophils Absolute: 0 10*3/uL (ref 0.0–0.1)
Basophils Relative: 0 %
Eosinophils Absolute: 0.1 10*3/uL (ref 0.0–0.7)
Eosinophils Relative: 1 %
HEMATOCRIT: 41.1 % (ref 36.0–46.0)
Hemoglobin: 13.5 g/dL (ref 12.0–15.0)
LYMPHS PCT: 27 %
Lymphs Abs: 2.1 10*3/uL (ref 0.7–4.0)
MCH: 29.1 pg (ref 26.0–34.0)
MCHC: 32.8 g/dL (ref 30.0–36.0)
MCV: 88.6 fL (ref 78.0–100.0)
MONO ABS: 0.5 10*3/uL (ref 0.1–1.0)
MONOS PCT: 6 %
NEUTROS ABS: 5.1 10*3/uL (ref 1.7–7.7)
Neutrophils Relative %: 66 %
Platelets: 385 10*3/uL (ref 150–400)
RBC: 4.64 MIL/uL (ref 3.87–5.11)
RDW: 13.4 % (ref 11.5–15.5)
WBC: 7.8 10*3/uL (ref 4.0–10.5)

## 2016-01-07 LAB — BASIC METABOLIC PANEL
ANION GAP: 12 (ref 5–15)
BUN: 8 mg/dL (ref 6–20)
CALCIUM: 9.7 mg/dL (ref 8.9–10.3)
CHLORIDE: 105 mmol/L (ref 101–111)
CO2: 25 mmol/L (ref 22–32)
Creatinine, Ser: 0.74 mg/dL (ref 0.44–1.00)
GFR calc Af Amer: >60 mL/min (ref >60)
GFR calc non Af Amer: >60 mL/min (ref >60)
GLUCOSE: 95 mg/dL (ref 65–99)
Potassium: 3.5 mmol/L (ref 3.5–5.1)
Sodium: 142 mmol/L (ref 135–145)

## 2016-01-07 LAB — CBG MONITORING, ED: Glucose-Capillary: 98 mg/dL (ref 65–99)

## 2016-01-07 MED ORDER — SODIUM CHLORIDE 0.9 % IV BOLUS (SEPSIS)
1000.0000 mL | Freq: Once | INTRAVENOUS | Status: AC
  Administered 2016-01-07: 1000 mL via INTRAVENOUS

## 2016-01-07 MED ORDER — SODIUM CHLORIDE 0.9 % IV BOLUS (SEPSIS): 1000.0000 mL | Freq: Once | INTRAVENOUS | Status: DC

## 2016-01-07 MED ORDER — SODIUM CHLORIDE 0.9 % IV SOLN: INTRAVENOUS | Status: DC

## 2016-01-07 NOTE — Discharge Instructions (Signed)
Follow-up with a primary care doctor to have your blood pressure rechecked next week, return as needed for worsening symptoms and episodes

## 2016-01-07 NOTE — ED Provider Notes (Signed)
[facility]-EMERGENCY DEPT Provider Note   CSN: [id number] Arrival date & time: 01/07/16  1659     History   Chief Complaint Chief Complaint  Patient presents with  . Dizziness  . Irregular Heart Beat    HPI Raven Buckley is a 60 y.o. female.  HPI Pt was on the bus on her way to work.  She started feeling hot and lightheaded.  She had a brief syncopal episode.  She does not think she completely lost consciousness.  That felling lasted for 10-15 minutes.  EMS was called.  No headache.  NO focal numbness or weakness.  EMS was called and she was sent to the ED.   She is feeling a little better now.  Pt had a similar episode years ago.  She was told it was related to her not eating well and her potassium being low.  She denies any medical problems but has not seen a doctor in a while.  Past Medical History:  Diagnosis Date  . Hypertension     Patient Active Problem List   Diagnosis Date Noted  . Syncope 08/29/2011  . Hypokalemia 08/29/2011    History reviewed. No pertinent surgical history.  OB History    No data available       Home Medications    Prior to Admission medications   Not on File    Family History History reviewed. No pertinent family history.  Social History Social History  Substance Use Topics  . Smoking status: Current Every Day Smoker    Packs/day: 0.50  . Smokeless tobacco: Never Used  . Alcohol use No     Allergies   Penicillins   Review of Systems Review of Systems  All other systems reviewed and are negative.    Physical Exam Updated Vital Signs BP 168/91 (BP Location: Left Arm)   Pulse 88   Resp 18   Ht 4\' 11"  (1.499 m)   Wt 47.2 kg   SpO2 99%   BMI 21.01 kg/m   Physical Exam  Constitutional: She appears well-developed and well-nourished. No distress.  HENT:  Head: Normocephalic and atraumatic.  Right Ear: External ear normal.  Left Ear: External ear normal.  Eyes: Conjunctivae are normal. Right eye exhibits no  discharge. Left eye exhibits no discharge. No scleral icterus.  Neck: Neck supple. No tracheal deviation present.  Cardiovascular: Normal rate, regular rhythm and intact distal pulses.   Pulmonary/Chest: Effort normal and breath sounds normal. No stridor. No respiratory distress. She has no wheezes. She has no rales.  Abdominal: Soft. Bowel sounds are normal. She exhibits no distension. There is no tenderness. There is no rebound and no guarding.  Musculoskeletal: She exhibits no edema or tenderness.  Neurological: She is alert. She has normal strength. No cranial nerve deficit (no facial droop, extraocular movements intact, no slurred speech) or sensory deficit. She exhibits normal muscle tone. She displays no seizure activity. Coordination normal.  Skin: Skin is warm and dry. No rash noted.  Psychiatric: She has a normal mood and affect.  Nursing note and vitals reviewed.    ED Treatments / Results  Labs (all labs ordered are listed, but only abnormal results are displayed) Labs Reviewed  CBC WITH DIFFERENTIAL/PLATELET  BASIC METABOLIC PANEL  POCT CBG (FASTING - GLUCOSE)-MANUAL ENTRY  CBG MONITORING, ED    EKG  EKG Interpretation  Date/Time:  Tuesday January 07 2016 17:15:34 EST Ventricular Rate:  75 PR Interval:    QRS Duration: 93 QT Interval:  447 QTC Calculation: 500 R Axis:   25 Text Interpretation:  Sinus rhythm Multiple ventricular premature complexes Right atrial enlargement Left ventricular hypertrophy Borderline prolonged QT interval No significant change since last tracing  except PVCs are new Confirmed by Emari Demmer  MD-J, Calea Hribar ([id number]) on 01/07/2016 5:28:16 PM       Radiology Dg Chest 2 View  Result Date: 01/07/2016 CLINICAL DATA:  Syncope with dry cough for 1 week. EXAM: CHEST  2 VIEW COMPARISON:  08/28/2011 FINDINGS: The cardio pericardial silhouette is enlarged. The lungs are clear wiithout focal pneumonia, edema, pneumothorax or pleural effusion. The visualized  bony structures of the thorax are intact. IMPRESSION: Enlargement of the cardiopericardial silhouette, stable. No acute cardiopulmonary findings. Electronically Signed   By: Kennith CenterEric  Mansell M.D.   On: 01/07/2016 18:23    Procedures Procedures (including critical care time)  Medications Ordered in ED Medications  sodium chloride 0.9 % bolus 1,000 mL (1,000 mLs Intravenous New Bag/Given 01/07/16 1740)    And  sodium chloride 0.9 % bolus 1,000 mL (not administered)    And  0.9 %  sodium chloride infusion (not administered)     Initial Impression / Assessment and Plan / ED Course  I have reviewed the triage vital signs and the nursing notes.  Pertinent labs & imaging results that were available during my care of the patient were reviewed by me and considered in my medical decision making (see chart for details).  Clinical Course    Pt presented to the ED with near syncope.  EKG showed PVCs.  Pt does have hypertension but I doubt that is causing her any symptoms.  Stable for dc.  Dc home with follow up with a PCP regarding her HTN  Final Clinical Impressions(s) / ED Diagnoses   Final diagnoses:  PVC's (premature ventricular contractions)  Essential hypertension    New Prescriptions New Prescriptions   No medications on file     Linwood DibblesJon Bralyn Folkert, MD 01/07/16 2011

## 2016-01-07 NOTE — ED Triage Notes (Signed)
Pt arrived via [facility] c/o dizziness while riding the bus.  EMS captured bigeminy and trigeminy on EKG.  Denies any pain.  CBG 144.

## 2016-01-07 NOTE — ED Notes (Signed)
cbg was 98 

## 2016-01-14 ENCOUNTER — Inpatient Hospital Stay: Payer: 59

## 2016-01-16 ENCOUNTER — Ambulatory Visit: Payer: 59 | Attending: Internal Medicine | Admitting: Physician Assistant

## 2016-01-16 VITALS — BP 180/105 | HR 101 | Temp 98.3°F | Resp 16 | Wt 121.6 lb

## 2016-01-16 DIAGNOSIS — R55 Syncope and collapse: Secondary | ICD-10-CM | POA: Insufficient documentation

## 2016-01-16 DIAGNOSIS — Z880 Allergy status to penicillin: Secondary | ICD-10-CM | POA: Insufficient documentation

## 2016-01-16 DIAGNOSIS — F17200 Nicotine dependence, unspecified, uncomplicated: Secondary | ICD-10-CM

## 2016-01-16 DIAGNOSIS — Z720 Tobacco use: Secondary | ICD-10-CM | POA: Insufficient documentation

## 2016-01-16 DIAGNOSIS — I10 Essential (primary) hypertension: Secondary | ICD-10-CM

## 2016-01-16 MED ORDER — HYDROCHLOROTHIAZIDE 25 MG PO TABS: 25.0000 mg | ORAL_TABLET | Freq: Every day | ORAL | 3 refills | Status: DC

## 2016-01-16 MED FILL — HYDROCHLOROTHIAZIDE 25 MG T: 25 | 30 days supply | Qty: 30 | Fill #0

## 2016-01-16 NOTE — Patient Instructions (Signed)
Start Blood pressure medication.  Stop smoking.  Take a baby aspirin(81mg ) daily.  Check Blood pressure at least 2 times each week and record the numbers.  Bring this to your next visit.

## 2016-01-16 NOTE — Progress Notes (Signed)
Raven Buckley, is a 60 y.o. female  WUJ:[id number]  NFA:[id number]  DOB - 07/29/1955  Subjective:  Chief Complaint and HPI: Raven Buckley is a 60 y.o. female here today to establish care and for a follow up visit after being seen and treated in the ED after a syncopal episode.  EKG showed PVC.  Patient was hydrated, stabilized and discharged.  She denies h/o htn but has not had regular health car in a long time.  She denies HA or CP.  She had a prior syncopal episode in 2013 and was hospitalized.  At that time, her K+ was 2.8 and the episode was believed to be vasovagal. Cardiac work-up was negative.   Today she has no complaints.  She is feeling good.Marland Kitchen.    ED/Hospital notes reviewed.     ROS:   Constitutional:  No f/c, No night sweats, No unexplained weight loss. EENT:  No vision changes, No blurry vision, No hearing changes. No mouth, throat, or ear problems.  Respiratory: No cough, No SOB Cardiac: No CP, no palpitations GI:  No abd pain, No N/V/D. GU: No Urinary s/sx Musculoskeletal: No joint pain Neuro: No headache, no dizziness, no motor weakness.  Skin: No rash Endocrine:  No polydipsia. No polyuria.  Psych: Denies SI/HI  Problem  Hypokalemia (Resolved)    ALLERGIES: Allergies  Allergen Reactions  . Penicillins Rash    PAST MEDICAL HISTORY: Past Medical History:  Diagnosis Date  . Hypertension     MEDICATIONS AT HOME: Prior to Admission medications   Medication Sig Start Date End Date Taking? Authorizing Provider  hydrochlorothiazide (HYDRODIURIL) 25 MG tablet Take 1 tablet (25 mg total) by mouth daily. 01/16/16   Anders SimmondsAngela M Aarna Mihalko, PA-C     Objective:  EXAM:   Vitals:   01/16/16 1027  BP: (!) 180/105  Pulse: (!) 101  Resp: 16  Temp: 98.3 F (36.8 C)  TempSrc: Oral  SpO2: 98%  Weight: 121 lb 9.6 oz (55.2 kg)    General appearance : A&OX3. NAD. Non-toxic-appearing HEENT: Atraumatic and Normocephalic.  PERRLA. EOM intact.  TM clear B. Mouth-MMM,  post pharynx WNL w/o erythema, No PND. Neck: supple, no JVD. No cervical lymphadenopathy. No thyromegaly Chest/Lungs:  Breathing-non-labored, Good air entry bilaterally, breath sounds normal without rales, rhonchi, or wheezing  CVS: S1 S2 regular, no murmurs, gallops, rubs.  No ectopic beats Abdomen: Bowel sounds present, Non tender and not distended with no gaurding, rigidity or rebound. Extremities: Bilateral Lower Ext shows no edema, both legs are warm to touch with = pulse throughout Neurology:  CN II-XII grossly intact, Non focal.   Psych:  TP linear. J/I WNL. Normal speech. Appropriate eye contact and affect.  Skin:  No Rash  Data Review Lab Results  Component Value Date   HGBA1C 5.9 (H) 08/29/2011     Assessment & Plan   1. Essential hypertension - hydrochlorothiazide (HYDRODIURIL) 25 MG tablet; Take 1 tablet (25 mg total) by mouth daily.  Dispense: 90 tablet; Refill: 3 Start baby aspirin daily  2. Smoker Smoking cessation advised  3. Resolved syncopal episode She previously had an extensive cardiac work-up when this happened a few years ago that was overall unremarkable.  At this time she is feeling good and wishes to defer any further work-up.  We can defer further work-up for now.  Patient have been counseled extensively about nutrition and exercise  Return in about 3 weeks (around 02/06/2016) for establish care and f/up htn. and labs  The patient  was given clear instructions to go to ER or return to medical center if symptoms don't improve, worsen or new problems develop. The patient verbalized understanding. The patient was told to call to get lab results if they haven't heard anything in the next week.   Raven CoAngela Leidi Astle, PA-C [facility] [place], [place] [phone]   01/16/2016, 10:49 AMPatient ID: Raven Buckley, female   DOB: 1955/04/21, 60 y.o.   MRN: [id number]

## 2016-02-17 DIAGNOSIS — H6122 Impacted cerumen, left ear: Secondary | ICD-10-CM | POA: Diagnosis not present

## 2016-02-17 DIAGNOSIS — H903 Sensorineural hearing loss, bilateral: Secondary | ICD-10-CM | POA: Diagnosis not present

## 2016-02-17 DIAGNOSIS — H6063 Unspecified chronic otitis externa, bilateral: Secondary | ICD-10-CM | POA: Diagnosis not present

## 2016-02-17 MED FILL — NEO/POLYMYXIN/HC EAR SUSP: 3.5-10000-1 | 17 days supply | Qty: 10 | Fill #0

## 2016-02-24 ENCOUNTER — Ambulatory Visit: Payer: 59 | Admitting: Family Medicine

## 2016-03-13 ENCOUNTER — Ambulatory Visit: Payer: 59 | Attending: Family Medicine | Admitting: Family Medicine

## 2016-03-13 ENCOUNTER — Encounter: Payer: Self-pay | Admitting: Family Medicine

## 2016-03-13 VITALS — BP 134/70 | HR 79 | Temp 98.2°F | Resp 18 | Ht 59.0 in | Wt 115.2 lb

## 2016-03-13 DIAGNOSIS — Z0000 Encounter for general adult medical examination without abnormal findings: Secondary | ICD-10-CM | POA: Diagnosis not present

## 2016-03-13 DIAGNOSIS — Z79899 Other long term (current) drug therapy: Secondary | ICD-10-CM | POA: Insufficient documentation

## 2016-03-13 DIAGNOSIS — I10 Essential (primary) hypertension: Secondary | ICD-10-CM | POA: Insufficient documentation

## 2016-03-13 MED ORDER — HYDROCHLOROTHIAZIDE 50 MG PO TABS: 25.0000 mg | ORAL_TABLET | Freq: Every day | ORAL | 2 refills | Status: DC

## 2016-03-13 MED FILL — HYDROCHLOROTHIAZIDE 25 MG T: 25 | 30 days supply | Qty: 30 | Fill #1

## 2016-03-13 NOTE — Patient Instructions (Addendum)
Start keeping a log of your blood pressures. Check BP 2 to 3 times per week. Schedule appointment for physical.    Hypertension Hypertension is another name for high blood pressure. High blood pressure forces your heart to work harder to pump blood. A blood pressure reading has two numbers, which includes a higher number over a lower number (example: 110/72). Follow these instructions at home:  Have your blood pressure rechecked by your doctor.  Only take medicine as told by your doctor. Follow the directions carefully. The medicine does not work as well if you skip doses. Skipping doses also puts you at risk for problems.  Do not smoke.  Monitor your blood pressure at home as told by your doctor. Contact a doctor if:  You think you are having a reaction to the medicine you are taking.  You have repeat headaches or feel dizzy.  You have puffiness (swelling) in your ankles.  You have trouble with your vision. Get help right away if:  You get a very bad headache and are confused.  You feel weak, numb, or faint.  You get chest or belly (abdominal) pain.  You throw up (vomit).  You cannot breathe very well. This information is not intended to replace advice given to you by your health care provider. Make sure you discuss any questions you have with your health care provider. Document Released: 07/08/2007 Document Revised: 06/27/2015 Document Reviewed: 11/11/2012 Elsevier Interactive Patient Education  2017 [employer]

## 2016-03-13 NOTE — Progress Notes (Signed)
Patient is here for HTN  Patient denies pain today  Patient declined the flu shot today  Patient ran out of meds like 2 days ago   Patient already went to the pharmacy for a refill somebody from pharmacy told her that it will be ready on Monday

## 2016-03-13 NOTE — Progress Notes (Signed)
   Subjective:  Patient ID: Raven Buckley, female    DOB: 19-Aug-1955  Age: 61 y.o. MRN: [id number]  CC: Hypertension   HPI Raven Buckley presents for hypertension follow-up. She denies any chest pain, shortness of breath, palpitations or swelling of the bilateral lower extremities. She did not have a blood pressure log to bring to this visit.    Outpatient Medications Prior to Visit  Medication Sig Dispense Refill  . hydrochlorothiazide (HYDRODIURIL) 25 MG tablet Take 1 tablet (25 mg total) by mouth daily. 90 tablet 3   No facility-administered medications prior to visit.     ROS Review of Systems  Eyes: Negative.   Respiratory: Negative.   Cardiovascular: Negative.   Gastrointestinal: Negative.   Skin: Negative.   Neurological: Negative.      Objective:  BP 134/70 (BP Location: Left Arm, Patient Position: Sitting, Cuff Size: Normal)   Pulse 79   Temp 98.2 F (36.8 C) (Oral)   Resp 18   Ht 4\' 11"  (1.499 m)   Wt 115 lb 3.2 oz (52.3 kg)   SpO2 99%   BMI 23.27 kg/m   BP/Weight 03/13/2016 01/16/2016 01/07/2016  Systolic BP 134 180 179  Diastolic BP 70 105 76  Wt. (Lbs) 115.2 121.6 104  BMI 23.27 24.56 21.01     Physical Exam  Neck: No JVD present.  Cardiovascular: Normal rate, regular rhythm, normal heart sounds and intact distal pulses.   Pulmonary/Chest: Effort normal and breath sounds normal.  Abdominal: Soft. Bowel sounds are normal.  Skin: Skin is warm and dry.  Nursing note and vitals reviewed.    Assessment & Plan:   Problem List Items Addressed This Visit      Cardiovascular and Mediastinum   Hypertension - Primary   Relevant Medications   hydrochlorothiazide (HYDRODIURIL) 50 MG tablet   Other Relevant Orders   Microalbumin/Creatinine Ratio, Urine    Other Visit Diagnoses    Healthcare maintenance       Relevant Orders   MM DIGITAL SCREENING BILATERAL      Meds ordered this encounter  Medications  . hydrochlorothiazide (HYDRODIURIL) 50 MG  tablet    Sig: Take 0.5 tablets (25 mg total) by mouth daily.    Dispense:  30 tablet    Refill:  2    Order Specific Question:   Supervising Provider    Answer:   Quentin AngstJEGEDE, OLUGBEMIGA E [[id number]]    Follow-up: Return for Schedule appointment for physical. .   Raven BarkMandesia R Gerhard Rappaport FNP

## 2019-03-18 ENCOUNTER — Other Ambulatory Visit: Payer: Self-pay

## 2019-03-18 ENCOUNTER — Encounter (HOSPITAL_COMMUNITY): Payer: Self-pay | Admitting: Internal Medicine

## 2019-03-18 ENCOUNTER — Emergency Department (HOSPITAL_COMMUNITY): Payer: 59

## 2019-03-18 ENCOUNTER — Observation Stay (HOSPITAL_COMMUNITY)
Admission: EM | Admit: 2019-03-18 | Discharge: 2019-03-19 | Disposition: A | Discharge status: Home / Self Care | Payer: 59 | Attending: Internal Medicine | Admitting: Internal Medicine

## 2019-03-18 DIAGNOSIS — N3001 Acute cystitis with hematuria: Secondary | ICD-10-CM

## 2019-03-18 DIAGNOSIS — R55 Syncope and collapse: Principal | ICD-10-CM | POA: Insufficient documentation

## 2019-03-18 DIAGNOSIS — I10 Essential (primary) hypertension: Secondary | ICD-10-CM

## 2019-03-18 DIAGNOSIS — Z8249 Family history of ischemic heart disease and other diseases of the circulatory system: Secondary | ICD-10-CM | POA: Insufficient documentation

## 2019-03-18 DIAGNOSIS — I119 Hypertensive heart disease without heart failure: Secondary | ICD-10-CM | POA: Insufficient documentation

## 2019-03-18 DIAGNOSIS — Z20822 Contact with and (suspected) exposure to covid-19: Secondary | ICD-10-CM | POA: Diagnosis not present

## 2019-03-18 DIAGNOSIS — R9431 Abnormal electrocardiogram [ECG] [EKG]: Secondary | ICD-10-CM

## 2019-03-18 DIAGNOSIS — N390 Urinary tract infection, site not specified: Secondary | ICD-10-CM

## 2019-03-18 DIAGNOSIS — F419 Anxiety disorder, unspecified: Secondary | ICD-10-CM | POA: Diagnosis not present

## 2019-03-18 DIAGNOSIS — F17210 Nicotine dependence, cigarettes, uncomplicated: Secondary | ICD-10-CM | POA: Insufficient documentation

## 2019-03-18 DIAGNOSIS — Z720 Tobacco use: Secondary | ICD-10-CM

## 2019-03-18 LAB — URINALYSIS, ROUTINE W REFLEX MICROSCOPIC
Bilirubin Urine: NEGATIVE
Glucose, UA: NEGATIVE mg/dL
Hgb urine dipstick: NEGATIVE
Ketones, ur: 5 mg/dL — AB
Nitrite: NEGATIVE
Protein, ur: 30 mg/dL — AB
Specific Gravity, Urine: 1.018 (ref 1.005–1.030)
pH: 6 (ref 5.0–8.0)

## 2019-03-18 LAB — CBC
HCT: 46 % (ref 36.0–46.0)
HCT: 38 % (ref 36.0–46.0)
Hemoglobin: 14.7 g/dL (ref 12.0–15.0)
Hemoglobin: 12.2 g/dL (ref 12.0–15.0)
MCH: 29.8 pg (ref 26.0–34.0)
MCH: 29.6 pg (ref 26.0–34.0)
MCHC: 32 g/dL (ref 30.0–36.0)
MCHC: 32.1 g/dL (ref 30.0–36.0)
MCV: 93.1 fL (ref 80.0–100.0)
MCV: 92.2 fL (ref 80.0–100.0)
Platelets: 370 10*3/uL (ref 150–400)
Platelets: 341 10*3/uL (ref 150–400)
RBC: 4.94 MIL/uL (ref 3.87–5.11)
RBC: 4.12 MIL/uL (ref 3.87–5.11)
RDW: 13.4 % (ref 11.5–15.5)
RDW: 13.2 % (ref 11.5–15.5)
WBC: 8.7 10*3/uL (ref 4.0–10.5)
WBC: 6.6 10*3/uL (ref 4.0–10.5)
nRBC: 0 % (ref 0.0–0.2)
nRBC: 0 % (ref 0.0–0.2)

## 2019-03-18 LAB — LIPID PANEL
Cholesterol: 200 mg/dL (ref 0–200)
HDL: 47 mg/dL (ref >40)
LDL Cholesterol: 141 mg/dL — ABNORMAL HIGH (ref 0–99)
Total CHOL/HDL Ratio: 4.3 RATIO
Triglycerides: 60 mg/dL (ref <150)
VLDL: 12 mg/dL (ref 0–40)

## 2019-03-18 LAB — BASIC METABOLIC PANEL
Anion gap: 12 (ref 5–15)
BUN: 10 mg/dL (ref 8–23)
CO2: 22 mmol/L (ref 22–32)
Calcium: 9.6 mg/dL (ref 8.9–10.3)
Chloride: 107 mmol/L (ref 98–111)
Creatinine, Ser: 0.65 mg/dL (ref 0.44–1.00)
GFR calc Af Amer: >60 mL/min (ref >60)
GFR calc non Af Amer: >60 mL/min (ref >60)
Glucose, Bld: 94 mg/dL (ref 70–99)
Potassium: 3.8 mmol/L (ref 3.5–5.1)
Sodium: 141 mmol/L (ref 135–145)

## 2019-03-18 LAB — RAPID URINE DRUG SCREEN, HOSP PERFORMED
Amphetamines: NOT DETECTED
Barbiturates: NOT DETECTED
Benzodiazepines: NOT DETECTED
Cocaine: NOT DETECTED
Opiates: NOT DETECTED
Tetrahydrocannabinol: POSITIVE — AB

## 2019-03-18 LAB — SARS CORONAVIRUS 2 (TAT 6-24 HRS): SARS Coronavirus 2: NEGATIVE

## 2019-03-18 LAB — CREATININE, SERUM
Creatinine, Ser: 0.63 mg/dL (ref 0.44–1.00)
GFR calc Af Amer: >60 mL/min (ref >60)
GFR calc non Af Amer: >60 mL/min (ref >60)

## 2019-03-18 LAB — TROPONIN I (HIGH SENSITIVITY)
Troponin I (High Sensitivity): 22 ng/L — ABNORMAL HIGH (ref <18)
Troponin I (High Sensitivity): 20 ng/L — ABNORMAL HIGH (ref <18)

## 2019-03-18 LAB — MAGNESIUM: Magnesium: 2.1 mg/dL (ref 1.7–2.4)

## 2019-03-18 LAB — CBG MONITORING, ED: Glucose-Capillary: 138 mg/dL — ABNORMAL HIGH (ref 70–99)

## 2019-03-18 MED ORDER — ACETAMINOPHEN 325 MG PO TABS: 650.0000 mg | ORAL_TABLET | ORAL | Status: DC | PRN

## 2019-03-18 MED ORDER — SODIUM CHLORIDE 0.9 % IV BOLUS
1000.0000 mL | Freq: Once | INTRAVENOUS | Status: AC
  Administered 2019-03-18: 1000 mL via INTRAVENOUS

## 2019-03-18 MED ORDER — ENOXAPARIN SODIUM 40 MG/0.4ML ~~LOC~~ SOLN
40.0000 mg | SUBCUTANEOUS | Status: DC
  Administered 2019-03-18: 40 mg via SUBCUTANEOUS
  Filled 2019-03-18: qty 0.4

## 2019-03-18 MED ORDER — FOSFOMYCIN TROMETHAMINE 3 G PO PACK
3.0000 g | PACK | Freq: Once | ORAL | Status: AC
  Administered 2019-03-18: 15:00:00 3 g via ORAL
  Filled 2019-03-18: qty 3

## 2019-03-18 MED ORDER — ONDANSETRON HCL 4 MG/2ML IJ SOLN: 4.0000 mg | Freq: Four times a day (QID) | INTRAMUSCULAR | Status: DC | PRN

## 2019-03-18 MED ORDER — CARVEDILOL 6.25 MG PO TABS
6.2500 mg | ORAL_TABLET | Freq: Two times a day (BID) | ORAL | Status: DC
  Administered 2019-03-19: 6.25 mg via ORAL
  Filled 2019-03-18: qty 1

## 2019-03-18 MED ORDER — SODIUM CHLORIDE 0.9 % IV SOLN: 1.0000 g | Freq: Once | INTRAVENOUS | Status: DC

## 2019-03-18 NOTE — ED Notes (Signed)
Two unsuccessful attempts to draw blood for repeat troponin.  Margo PA aware.  Hospital phlebotomy called to come and attempt.

## 2019-03-18 NOTE — ED Provider Notes (Signed)
[facility]-EMERGENCY DEPT Provider Note   CSN: [id number] Arrival date & time: 03/18/19  0813     History Chief Complaint  Patient presents with  . Near Syncope    Raven Buckley is a 64 y.o. female with PMHx HTN who presents to the ED today via EMS for near syncopal episode that [place] the [employer] [place] earlier this morning. Pt reports she was on her way to work at [employer] - she felt "a mild headache" that lasted a couple of seconds with some blurry vision bilaterally. She reports after that she felt like she could pass out. Pt states she is back to her baseline now and feels improved. She reports similar episode occurring years ago however cannot recall what the cause was. Pt denies any other precipitating symptoms including chest pain, SOB, dizziness/lightheadedness, unilateral weakness or numbness, neck pain. She does state she has had some rhinorrhea as of late however denies any other symptoms including cough, SOB, fevers, chills, sore throat. No recent COVID 19 positive exposure.   The history is provided by the patient and the EMS personnel.       Past Medical History:  Diagnosis Date  . Hypertension     Patient Active Problem List   Diagnosis Date Noted  . Hypertension 03/13/2016  . Syncope 08/29/2011    No past surgical history on file.   OB History   No obstetric history on file.     No family history on file.  Social History   Tobacco Use  . Smoking status: Current Every Day Smoker    Packs/day: 0.50  . Smokeless tobacco: Never Used  Substance Use Topics  . Alcohol use: No  . Drug use: Yes    Types: Marijuana    Home Medications Prior to Admission medications   Medication Sig Start Date End Date Taking? Authorizing Provider  hydrochlorothiazide (HYDRODIURIL) 50 MG tablet Take 0.5 tablets (25 mg total) by mouth daily. Patient not taking: Reported on 03/18/2019 03/13/16   Lizbeth Bark, FNP    Allergies      Penicillins  Review of Systems   Review of Systems  Constitutional: Negative for chills and fever.  HENT: Positive for rhinorrhea. Negative for congestion and sore throat.   Eyes: Positive for visual disturbance (blurry vision, resolved).  Respiratory: Negative for shortness of breath.   Cardiovascular: Negative for chest pain.  Gastrointestinal: Negative for abdominal pain, diarrhea, nausea and vomiting.  Musculoskeletal: Negative for myalgias.  Neurological: Positive for headaches (resolved). Negative for dizziness, syncope, speech difficulty, weakness, light-headedness and numbness.  All other systems reviewed and are negative.   Physical Exam Updated Vital Signs BP (!) 154/73 (BP Location: Right Arm)   Pulse 61   Temp 97.6 F (36.4 C) (Oral)   Resp (!) 25   SpO2 100%   Physical Exam Vitals and nursing note reviewed.  Constitutional:      Appearance: She is not ill-appearing or diaphoretic.  HENT:     Head: Normocephalic and atraumatic.  Eyes:     Conjunctiva/sclera: Conjunctivae normal.  Cardiovascular:     Rate and Rhythm: Normal rate and regular rhythm.     Pulses: Normal pulses.  Pulmonary:     Effort: Pulmonary effort is normal.     Breath sounds: Normal breath sounds. No wheezing, rhonchi or rales.  Abdominal:     Palpations: Abdomen is soft.     Tenderness: There is no abdominal tenderness. There is no guarding or rebound.  Musculoskeletal:     Cervical back: Neck supple.     Right lower leg: No edema.     Left lower leg: No edema.  Skin:    General: Skin is warm and dry.  Neurological:     Mental Status: She is alert.     Comments: CN 3-12 grossly intact A&O x4 GCS 15 Sensation and strength intact Gait nonataxic including with tandem walking Coordination with finger-to-nose WNL Neg romberg, neg pronator drift     ED Results / Procedures / Treatments   Labs (all labs ordered are listed, but only abnormal results are displayed) Labs Reviewed   URINALYSIS, ROUTINE W REFLEX MICROSCOPIC - Abnormal; Notable for the following components:      Result Value   APPearance HAZY (*)    Ketones, ur 5 (*)    Protein, ur 30 (*)    Leukocytes,Ua MODERATE (*)    Bacteria, UA RARE (*)    All other components within normal limits  RAPID URINE DRUG SCREEN, HOSP PERFORMED - Abnormal; Notable for the following components:   Tetrahydrocannabinol POSITIVE (*)    All other components within normal limits  CBG MONITORING, ED - Abnormal; Notable for the following components:   Glucose-Capillary 138 (*)    All other components within normal limits  TROPONIN I (HIGH SENSITIVITY) - Abnormal; Notable for the following components:   Troponin I (High Sensitivity) 22 (*)    All other components within normal limits  TROPONIN I (HIGH SENSITIVITY) - Abnormal; Notable for the following components:   Troponin I (High Sensitivity) 20 (*)    All other components within normal limits  URINE CULTURE  SARS CORONAVIRUS 2 (TAT 6-24 HRS)  BASIC METABOLIC PANEL  CBC  MAGNESIUM    EKG EKG Interpretation  Date/Time:  Saturday March 18 2019 08:48:23 EST Ventricular Rate:  61 PR Interval:    QRS Duration: 90 QT Interval:  524 QTC Calculation: 528 R Axis:   30 Text Interpretation: Sinus rhythm Left ventricular hypertrophy Abnormal T, consider ischemia, diffuse leads Prolonged QT interval No STEMI Confirmed by Nanda Quinton [phone]) on 03/18/2019 8:53:05 AM   Radiology CT Head Wo Contrast  Result Date: 03/18/2019 CLINICAL DATA:  Near syncopal episode. EXAM: CT HEAD WITHOUT CONTRAST TECHNIQUE: Contiguous axial images were obtained from the base of the skull through the vertex without intravenous contrast. COMPARISON:  August 28, 2011 FINDINGS: Brain: No evidence of acute infarction, hemorrhage, hydrocephalus, extra-axial collection or mass lesion/mass effect. Vascular: No hyperdense vessel is noted. Skull: Normal. Negative for fracture or focal lesion.  Sinuses/Orbits: No acute finding. Other: None. IMPRESSION: No focal acute intracranial abnormality identified. Electronically Signed   By: Abelardo Diesel M.D.   On: 03/18/2019 09:54    Procedures Procedures (including critical care time)  Medications Ordered in ED Medications  fosfomycin (MONUROL) packet 3 g (has no administration in time range)  sodium chloride 0.9 % bolus 1,000 mL (0 mLs Intravenous Stopped 03/18/19 1447)    ED Course  I have reviewed the triage vital signs and the nursing notes.  Pertinent labs & imaging results that were available during my care of the patient were reviewed by me and considered in my medical decision making (see chart for details).  Clinical Course as of Mar 17 1456  Sat Mar 18, 2019  1214 Tetrahydrocannabinol(!): POSITIVE [MV]    Clinical Course User Index [MV] Eustaquio Maize, [place]   64 year old female who presents to the ED today via EMS for presyncopal episode that occurred  while on the bus today going to work.  On arrival to the ED patient is afebrile, nontachycardic and nontachypneic.  She states that she feels back to her baseline now.  Patient is however poor historian and does not give much information. She states she had a mild headache earlier but has now resolved.  She has no focal neuro deficits on exam today.  She is alert and oriented x3.  Work-up for presyncope at this time, CT head also added given complaint of headache prior to presyncopal feeling however again no focal neuro deficits. Orthostatics also ordered.   EKG with signs of ischemia with abnormal T wave inversions diffusely.  Denies any chest pain or shortness of breath however again poor historian, will add on troponins.   CT Head negative.  CBC without leukocytosis. Hgb stable.  BMP without electrolyte abnormalities. Creatinine stable at 0.65.  Mag within normal limits 2.1.  Initial troponin 22; will repeat  UDS positive for THC U/A with moderate leuks, 6-10 RBCs per  HPF, 6-10 WBCs per HPF. Pt does report frequent urination - will treat with abx. Will send for culture.   Repeat trop of 20. Given EKG changes will consult cardiology at this time for further reccs. Pts HEART score of 3 - recommends admission.   Discussed case with Dr. Eden Emms with cardiology who recommends medicine admission given EKG changes.   Discussed case with Dr. Dairl Ponder with [facility] who agrees to accept patient for admission for cardiac work up  MDM Rules/Calculators/A&P                       Final Clinical Impression(s) / ED Diagnoses Final diagnoses:  Near syncope  EKG abnormalities  Acute cystitis with hematuria    Rx / DC Orders ED Discharge Orders    None       Tanda Rockers, PA-C 03/18/19 1458    Long, Arlyss Repress, MD 03/19/19 [phone]

## 2019-03-18 NOTE — ED Notes (Signed)
Pt ambulated to the restroom with a steady gait.

## 2019-03-18 NOTE — ED Triage Notes (Signed)
Pt presents via [facility] and states that she had a near syncopal [place] [employer] [place]. Alert and oriented. 20g L hand via EMS.

## 2019-03-18 NOTE — H&P (Addendum)
History and Physical        Hospital Admission Note Date: 03/18/2019  Patient name: Raven Buckley Medical record number: [id number] Date of birth: 07/06/1955 Age: 64 y.o. Gender: female  PCP: Lizbeth Bark, FNP  Patient coming from: Home Lives with: Alone At baseline, ambulates: Independently  Chief Complaint    Near Syncope   HPI:   This is a 64 year old female with a history of hypertension, tobacco use who presented to the ED on 03/18/19 via EMS for presyncopal episode that occurred while patient was on the bus this morning while seated. Initially had mild headache for a few seconds then with some bilateral blurry vision. Felt like she could pass out at the time.  This only lasted a few seconds to minutes.  She is back to baseline in ED however in ED patient was found to have prolonged QTc (528 ms) as well as new T wave inversions in V3-V6 and II, III, aVF. Cardiology was notified by ED PA who recommended medicine admission and cardio eval.   Has a family history of a brother who died in May 29, 2006 of cardiac issues.  Mother and father passed away as well due to cancer.  No other known family history.  Also reportedly had urinary frequency and mildly positive UA.  Notable ED labs: CBC and BMP unremarkable, HS Troponin 22->20, Mg 2.1, UDS +THC, UA hazy with rare bacteria and 6-10 WBC. ED treatments: NS bolus, Fosfomycin x 1 ED imaging: CT head without contrast: No acute findings ED vitals:  Vitals:   03/18/19 1030 03/18/19 1300  BP: 124/75 134/67  Pulse: 67 69  Resp: 20 (!) 23  Temp:    SpO2: 98% 99%     Review of Systems:  Review of Systems  Constitutional: Negative for chills and fever.  HENT: Negative for congestion.   Eyes: Negative for blurred vision and double vision.  Respiratory: Negative for cough and shortness of breath.   Cardiovascular: Negative for  chest pain, palpitations and orthopnea.  Gastrointestinal: Negative for nausea and vomiting.  Genitourinary: Positive for frequency. Negative for flank pain and hematuria.  Musculoskeletal: Negative for falls and myalgias.  Neurological: Negative.  Negative for weakness.  Psychiatric/Behavioral:       Denies substance use  All other systems reviewed and are negative.   Medical/Social/Family History   Past Medical History: Past Medical History:  Diagnosis Date  . Hypertension     No past surgical history on file.  Medications: Prior to Admission medications   Medication Sig Start Date End Date Taking? Authorizing Provider  hydrochlorothiazide (HYDRODIURIL) 50 MG tablet Take 0.5 tablets (25 mg total) by mouth daily. Patient not taking: Reported on 03/18/2019 03/13/16   Lizbeth Bark, FNP    Allergies:   Allergies  Allergen Reactions  . Penicillins Other (See Comments)    pt stated: "I don't know it just bothers me but I did not have a rash or swelling"      Social History:  reports that she has been smoking. She has been smoking about 0.50 packs per day. She has never used smokeless tobacco. She reports current drug use. Drug: Marijuana. She reports that she does not drink  alcohol.  Family History: No family history on file.   Objective   Physical Exam: Blood pressure 134/67, pulse 69, temperature 97.6 F (36.4 C), temperature source Oral, resp. rate (!) 23, SpO2 99 %.  Physical Exam Vitals and nursing note reviewed.  Constitutional:      Appearance: Normal appearance.  HENT:     Head: Normocephalic and atraumatic.  Eyes:     Conjunctiva/sclera: Conjunctivae normal.  Cardiovascular:     Rate and Rhythm: Normal rate and regular rhythm.     Comments: Hypertensive at bedside Pulmonary:     Effort: Pulmonary effort is normal.     Breath sounds: Normal breath sounds.  Abdominal:     General: Abdomen is flat.     Palpations: Abdomen is soft.    Musculoskeletal:        General: No swelling or tenderness.  Skin:    Coloration: Skin is not jaundiced or pale.  Neurological:     Mental Status: She is alert. Mental status is at baseline.  Psychiatric:        Mood and Affect: Mood normal.        Behavior: Behavior normal.     LABS on Admission: I have personally reviewed all the labs and imaging below    Basic Metabolic Panel: Recent Labs  Lab 03/18/19 0823 03/18/19 0853  NA 141  --   K 3.8  --   CL 107  --   CO2 22  --   GLUCOSE 94  --   BUN 10  --   CREATININE 0.65  --   CALCIUM 9.6  --   MG  --  2.1   Liver Function Tests: No results for input(s): AST, ALT, ALKPHOS, BILITOT, PROT, ALBUMIN in the last 168 hours. No results for input(s): LIPASE, AMYLASE in the last 168 hours. No results for input(s): AMMONIA in the last 168 hours. CBC: Recent Labs  Lab 03/18/19 0823  WBC 8.7  HGB 14.7  HCT 46.0  MCV 93.1  PLT 370   Cardiac Enzymes: No results for input(s): CKTOTAL, CKMB, CKMBINDEX, TROPONINI in the last 168 hours. BNP: Invalid input(s): POCBNP CBG: Recent Labs  Lab 03/18/19 0851  GLUCAP 138*    Radiological Exams on Admission:  CT Head Wo Contrast  Result Date: 03/18/2019 CLINICAL DATA:  Near syncopal episode. EXAM: CT HEAD WITHOUT CONTRAST TECHNIQUE: Contiguous axial images were obtained from the base of the skull through the vertex without intravenous contrast. COMPARISON:  August 28, 2011 FINDINGS: Brain: No evidence of acute infarction, hemorrhage, hydrocephalus, extra-axial collection or mass lesion/mass effect. Vascular: No hyperdense vessel is noted. Skull: Normal. Negative for fracture or focal lesion. Sinuses/Orbits: No acute finding. Other: None. IMPRESSION: No focal acute intracranial abnormality identified. Electronically Signed   By: Abelardo Diesel M.D.   On: 03/18/2019 09:54      EKG: Independently reviewed.  Normal axis, normal sinus rhythm, T wave inversions in V3 to V6 and II, III,  aVF   A & P   Active Problems:   * No active hospital problems. *   1. Presyncopal episode, concern for cardiac etiology a. EKG: QTc prolongation, T wave inversions b. Check orthostatic vital signs c. Telemetry d. Echo e. Lipid panel f. HA1C 2. Abnormal EKG a. Avoid QT prolonging agents b. Telemetry c. Echo d. Beta-blocker e. Cardiology eval (consulted by ED physician) 3. Hypertension a. Change HCTZ to carvedilol 4. Symptomatic UTI a. Urinary frequency on presentation b. Received fosfomycin x1 in ED  5. Tobacco use a. Reported she smokes 5 cigarettes a day b. Advised on cessation and stated that she would cut back 6. Positive UDS for THC a. Advised on cessation of illicit drugs   DVT prophylaxis: SCDs   Code Status: Prior  Diet: Heart healthy Family Communication: Admission, patients condition and plan of care including tests being ordered have been discussed with the patient who indicates understanding and agrees with the plan and Code Status. Patient reported she would update family Disposition Plan: The appropriate patient status for this patient is OBSERVATION. Observation status is judged to be reasonable and necessary in order to provide the required intensity of service to ensure the patient's safety. The patient's presenting symptoms, physical exam findings, and initial radiographic and laboratory data in the context of their medical condition is felt to place them at decreased risk for further clinical deterioration. Furthermore, it is anticipated that the patient will be medically stable for discharge from the hospital within 2 midnights of admission. The following factors support the patient status of observation.   " The patient's presenting symptoms include presyncope. " The physical exam findings include AAOx3, comfortable, telemetry with T wave inversions and abnormal EKG, hypertension. " The initial radiographic and laboratory data are unremarkable.       Consultants  . Cardiology  Procedures  . None  Time Spent on Admission: 55 minutes    Jae Dire, DO [facility] Pager [phone] 03/18/2019, 2:50 PM

## 2019-03-18 NOTE — Plan of Care (Signed)
Plan of care discussed.   

## 2019-03-19 ENCOUNTER — Observation Stay (HOSPITAL_BASED_OUTPATIENT_CLINIC_OR_DEPARTMENT_OTHER): Payer: 59

## 2019-03-19 DIAGNOSIS — Z20822 Contact with and (suspected) exposure to covid-19: Secondary | ICD-10-CM | POA: Diagnosis not present

## 2019-03-19 DIAGNOSIS — R9431 Abnormal electrocardiogram [ECG] [EKG]: Secondary | ICD-10-CM | POA: Diagnosis not present

## 2019-03-19 DIAGNOSIS — I361 Nonrheumatic tricuspid (valve) insufficiency: Secondary | ICD-10-CM

## 2019-03-19 DIAGNOSIS — R55 Syncope and collapse: Secondary | ICD-10-CM | POA: Diagnosis not present

## 2019-03-19 DIAGNOSIS — I119 Hypertensive heart disease without heart failure: Secondary | ICD-10-CM | POA: Diagnosis not present

## 2019-03-19 DIAGNOSIS — N3001 Acute cystitis with hematuria: Secondary | ICD-10-CM | POA: Diagnosis not present

## 2019-03-19 LAB — URINE CULTURE

## 2019-03-19 LAB — COMPREHENSIVE METABOLIC PANEL
ALT: 12 U/L (ref 0–44)
AST: 14 U/L — ABNORMAL LOW (ref 15–41)
Albumin: 3.7 g/dL (ref 3.5–5.0)
Alkaline Phosphatase: 77 U/L (ref 38–126)
Anion gap: 11 (ref 5–15)
BUN: 13 mg/dL (ref 8–23)
CO2: 22 mmol/L (ref 22–32)
Calcium: 9 mg/dL (ref 8.9–10.3)
Chloride: 107 mmol/L (ref 98–111)
Creatinine, Ser: 0.67 mg/dL (ref 0.44–1.00)
GFR calc Af Amer: >60 mL/min (ref >60)
GFR calc non Af Amer: >60 mL/min (ref >60)
Glucose, Bld: 100 mg/dL — ABNORMAL HIGH (ref 70–99)
Potassium: 3.9 mmol/L (ref 3.5–5.1)
Sodium: 140 mmol/L (ref 135–145)
Total Bilirubin: 0.9 mg/dL (ref 0.3–1.2)
Total Protein: 6.9 g/dL (ref 6.5–8.1)

## 2019-03-19 LAB — CBC
HCT: 39.2 % (ref 36.0–46.0)
Hemoglobin: 12.4 g/dL (ref 12.0–15.0)
MCH: 29.7 pg (ref 26.0–34.0)
MCHC: 31.6 g/dL (ref 30.0–36.0)
MCV: 94 fL (ref 80.0–100.0)
Platelets: 340 10*3/uL (ref 150–400)
RBC: 4.17 MIL/uL (ref 3.87–5.11)
RDW: 13.4 % (ref 11.5–15.5)
WBC: 5.4 10*3/uL (ref 4.0–10.5)
nRBC: 0 % (ref 0.0–0.2)

## 2019-03-19 LAB — HEMOGLOBIN A1C
Hgb A1c MFr Bld: 6 % — ABNORMAL HIGH (ref 4.8–5.6)
Mean Plasma Glucose: 125.5 mg/dL

## 2019-03-19 LAB — ECHOCARDIOGRAM COMPLETE
Height: 59 in
Weight: 1780.8 oz

## 2019-03-19 LAB — HIV ANTIBODY (ROUTINE TESTING W REFLEX): HIV Screen 4th Generation wRfx: NONREACTIVE

## 2019-03-19 MED ORDER — CARVEDILOL 6.25 MG PO TABS: 6.2500 mg | ORAL_TABLET | Freq: Two times a day (BID) | ORAL | 0 refills | Status: DC

## 2019-03-19 MED ORDER — NICOTINE 14 MG/24HR TD PT24: 14.0000 mg | MEDICATED_PATCH | Freq: Every day | TRANSDERMAL | 0 refills | Status: DC

## 2019-03-19 MED ORDER — NICOTINE 14 MG/24HR TD PT24
14.0000 mg | MEDICATED_PATCH | Freq: Every day | TRANSDERMAL | Status: DC
  Administered 2019-03-19: 14 mg via TRANSDERMAL
  Filled 2019-03-19: qty 1

## 2019-03-19 MED ORDER — LISINOPRIL 5 MG PO TABS: 5.0000 mg | ORAL_TABLET | Freq: Every day | ORAL | 0 refills | Status: DC

## 2019-03-19 MED ORDER — LISINOPRIL 5 MG PO TABS
5.0000 mg | ORAL_TABLET | Freq: Every day | ORAL | Status: DC
  Administered 2019-03-19: 12:00:00 5 mg via ORAL
  Filled 2019-03-19: qty 1

## 2019-03-19 MED ORDER — ATORVASTATIN CALCIUM 40 MG PO TABS: 40.0000 mg | ORAL_TABLET | Freq: Every day | ORAL | 0 refills | Status: DC

## 2019-03-19 NOTE — Plan of Care (Signed)
  Problem: Education: Goal: Knowledge of General Education information will improve Description: Including pain rating scale, medication(s)/side effects and non-pharmacologic comfort measures 03/19/2019 1355 by Elbert Ewings, RN Outcome: Adequate for Discharge 03/19/2019 1116 by Elbert Ewings, RN Outcome: Progressing   Problem: Health Behavior/Discharge Planning: Goal: Ability to manage health-related needs will improve 03/19/2019 1355 by Elbert Ewings, RN Outcome: Adequate for Discharge 03/19/2019 1116 by Elbert Ewings, RN Outcome: Progressing   Problem: Clinical Measurements: Goal: Ability to maintain clinical measurements within normal limits will improve 03/19/2019 1355 by Elbert Ewings, RN Outcome: Adequate for Discharge 03/19/2019 1116 by Elbert Ewings, RN Outcome: Progressing Goal: Will remain free from infection 03/19/2019 1355 by Elbert Ewings, RN Outcome: Adequate for Discharge 03/19/2019 1116 by Elbert Ewings, RN Outcome: Progressing Goal: Diagnostic test results will improve 03/19/2019 1355 by Elbert Ewings, RN Outcome: Adequate for Discharge 03/19/2019 1116 by Elbert Ewings, RN Outcome: Progressing Goal: Respiratory complications will improve 03/19/2019 1355 by Elbert Ewings, RN Outcome: Adequate for Discharge 03/19/2019 1116 by Elbert Ewings, RN Outcome: Progressing Goal: Cardiovascular complication will be avoided 03/19/2019 1355 by Elbert Ewings, RN Outcome: Adequate for Discharge 03/19/2019 1116 by Elbert Ewings, RN Outcome: Progressing   Problem: Activity: Goal: Risk for activity intolerance will decrease 03/19/2019 1355 by Elbert Ewings, RN Outcome: Adequate for Discharge 03/19/2019 1116 by Elbert Ewings, RN Outcome: Progressing   Problem: Nutrition: Goal: Adequate nutrition will be maintained 03/19/2019 1355 by Elbert Ewings, RN Outcome: Adequate for Discharge 03/19/2019 1116 by Elbert Ewings, RN Outcome:  Progressing   Problem: Coping: Goal: Level of anxiety will decrease 03/19/2019 1355 by Elbert Ewings, RN Outcome: Adequate for Discharge 03/19/2019 1116 by Elbert Ewings, RN Outcome: Progressing   Problem: Elimination: Goal: Will not experience complications related to bowel motility 03/19/2019 1355 by Elbert Ewings, RN Outcome: Adequate for Discharge 03/19/2019 1116 by Elbert Ewings, RN Outcome: Progressing Goal: Will not experience complications related to urinary retention 03/19/2019 1355 by Elbert Ewings, RN Outcome: Adequate for Discharge 03/19/2019 1116 by Elbert Ewings, RN Outcome: Progressing   Problem: Pain Managment: Goal: General experience of comfort will improve 03/19/2019 1355 by Elbert Ewings, RN Outcome: Adequate for Discharge 03/19/2019 1116 by Elbert Ewings, RN Outcome: Progressing   Problem: Safety: Goal: Ability to remain free from injury will improve 03/19/2019 1355 by Elbert Ewings, RN Outcome: Adequate for Discharge 03/19/2019 1116 by Elbert Ewings, RN Outcome: Progressing   Problem: Skin Integrity: Goal: Risk for impaired skin integrity will decrease 03/19/2019 1355 by Elbert Ewings, RN Outcome: Adequate for Discharge 03/19/2019 1116 by Elbert Ewings, RN Outcome: Progressing

## 2019-03-19 NOTE — Progress Notes (Signed)
  Echocardiogram 2D Echocardiogram has been performed.  Janalyn Harder 03/19/2019, 9:50 AM

## 2019-03-19 NOTE — Discharge Summary (Signed)
Physician Discharge Summary  SHERISA GILVIN ZOX:[id number] DOB: 1956/01/23 DOA: 03/18/2019  PCP: Lizbeth Bark, FNP  Admit date: 03/18/2019 Discharge date: 03/19/2019  Admitted From: Home Disposition:  Home  Recommendations for Outpatient Follow-up:  1. Follow up with PCP in 1-2 weeks  Discharge Condition:Stable CODE STATUS:Full Diet recommendation: Heart healthy   Brief/Interim Summary: 64 year old female with a history of hypertension, tobacco use who presented to the ED on 03/18/19 via EMS for presyncopal episode that occurred while patient was on the bus this morning while seated. Initially had mild headache for a few seconds then with some bilateral blurry vision. Felt like she could pass out at the time.  This only lasted a few seconds to minutes.  She is back to baseline in ED however in ED patient was found to have prolonged QTc (528 ms) as well as new T wave inversions in V3-V6 and II, III, aVF. Cardiology was notified by ED PA who recommended medicine admission and cardio eval.   Has a family history of a brother who died in 06-13-2006 of cardiac issues.  Mother and father passed away as well due to cancer.  No other known family history.  Also reportedly had urinary frequency and mildly positive UA.  Discharge Diagnoses:  Active Problems:   Near syncope   1. Presyncopal episode with severe LVH a. EKG: QTc prolongation, T wave inversions noted on EKG b. Orthostatic vitals are neg c. Echo reviewed, findings notable for severe LVH, normal LVEF with no wall motion d. Lipid panel notable for LDL of 141 e. HA1C 6.0 f. Discussed with Cardiology. Findings are likely reflective of severe LVH in setting of untreated HTN g. On coreg. Started low dose lisinopril. Pt made aware of chance of allergic reaction and to notify provider if signs or symptoms of angioedema related to ACEI h. Pt has remained stable 2. Abnormal EKG a. Beta-blocker started b. QTc improved c. See above.  Findings likely reflection of severe LVH. Started Lisinopril 3. Hypertension a. Now on coreg and lisinopril 4. Symptomatic UTI a. Urinary frequency on presentation b. Received fosfomycin x1 in ED 5. Tobacco use a. Reported she smokes 5 cigarettes a day b. Advised on cessation and stated that she would cut back c. Prescribed nioctine patch per pt's wishes 6. Positive UDS for THC a. Advised on cessation of illicit drugs 7. Anxiety a. Pt clearly anxious and at times tearful, acknowledging much stress in her life b. Pt denies suicidal ideations c. Given elevated QTc, would hold off on SSRI for now d. Pt advised to seek counseling after discharge  Discharge Instructions   Allergies as of 03/19/2019      Reactions   Penicillins Other (See Comments)   pt stated: "I don't know it just bothers me but I did not have a rash or swelling"      Medication List    STOP taking these medications   hydrochlorothiazide 50 MG tablet Commonly known as: HYDRODIURIL     TAKE these medications   carvedilol 6.25 MG tablet Commonly known as: COREG Take 1 tablet (6.25 mg total) by mouth 2 (two) times daily with a meal.   lisinopril 5 MG tablet Commonly known as: ZESTRIL Take 1 tablet (5 mg total) by mouth daily. Start taking on: March 20, 2019   nicotine 14 mg/24hr patch Commonly known as: NICODERM CQ - dosed in mg/24 hours Place 1 patch (14 mg total) onto the skin daily. Start taking on: March 20, 2019  Follow-up Information    Follow up with PCP in1-2 weeks. Schedule an appointment as soon as possible for a visit.          Allergies  Allergen Reactions  . Penicillins Other (See Comments)    pt stated: "I don't know it just bothers me but I did not have a rash or swelling"      Consultations:  Discussed case with Cardiology  Procedures/Studies: CT Head Wo Contrast  Result Date: 03/18/2019 CLINICAL DATA:  Near syncopal episode. EXAM: CT HEAD WITHOUT CONTRAST  TECHNIQUE: Contiguous axial images were obtained from the base of the skull through the vertex without intravenous contrast. COMPARISON:  August 28, 2011 FINDINGS: Brain: No evidence of acute infarction, hemorrhage, hydrocephalus, extra-axial collection or mass lesion/mass effect. Vascular: No hyperdense vessel is noted. Skull: Normal. Negative for fracture or focal lesion. Sinuses/Orbits: No acute finding. Other: None. IMPRESSION: No focal acute intracranial abnormality identified. Electronically Signed   By: Sherian Rein M.D.   On: 03/18/2019 09:54   ECHOCARDIOGRAM COMPLETE  Result Date: 03/19/2019    ECHOCARDIOGRAM REPORT   Patient Name:   DANETRA COLLVER Date of Exam: 03/19/2019 Medical Rec #:  [id number]     Height:       59.0 in Accession #:    [id number]    Weight:       111.3 lb Date of Birth:  09-12-1955    BSA:          1.44 m Patient Age:    63 years      BP:           157/68 mmHg Patient Gender: F             HR:           61 bpm. Exam Location:  Inpatient Procedure: 2D Echo, Cardiac Doppler and Color Doppler Indications:    R94.31 Abnormal EKG  History:        Patient has prior history of Echocardiogram examinations, most                 recent 08/31/2011. Abnormal ECG, Signs/Symptoms:Syncope; Risk                 Factors:Hypertension.  Sonographer:    Sheralyn Boatman RDCS Referring Phys: [id number] JARED E SEGAL IMPRESSIONS  1. Left ventricular ejection fraction, by estimation, is 60 to 65%. The left ventricle has normal function. The left ventricle has no regional wall motion abnormalities. The left ventricular internal cavity size was mildly dilated. There is severely increased left ventricular hypertrophy. Left ventricular diastolic parameters were normal.  2. Right ventricular systolic function is normal. The right ventricular size is normal. There is normal pulmonary artery systolic pressure.  3. Not well visualized cannot r/o PFO.  4. The mitral valve is normal in structure and function. Trivial mitral  valve regurgitation. No evidence of mitral stenosis.  5. The aortic valve is tricuspid. Aortic valve regurgitation is not visualized. No aortic stenosis is present.  6. The inferior vena cava is normal in size with greater than 50% respiratory variability, suggesting right atrial pressure of 3 mmHg. FINDINGS  Left Ventricle: Left ventricular ejection fraction, by estimation, is 60 to 65%. The left ventricle has normal function. The left ventricle has no regional wall motion abnormalities. The left ventricular internal cavity size was mildly dilated. There is  severely increased left ventricular hypertrophy. Left ventricular diastolic parameters were normal. Right Ventricle: The right ventricular size is normal. No increase  in right ventricular wall thickness. Right ventricular systolic function is normal. There is normal pulmonary artery systolic pressure. The tricuspid regurgitant velocity is 2.43 m/s, and  with an assumed right atrial pressure of 3 mmHg, the estimated right ventricular systolic pressure is 26.6 mmHg. Left Atrium: Left atrial size was normal in size. Right Atrium: Right atrial size was normal in size. Pericardium: There is no evidence of pericardial effusion. Mitral Valve: The mitral valve is normal in structure and function. There is mild thickening of the mitral valve leaflet(s). Normal mobility of the mitral valve leaflets. Trivial mitral valve regurgitation. No evidence of mitral valve stenosis. Tricuspid Valve: The tricuspid valve is normal in structure. Tricuspid valve regurgitation is mild . No evidence of tricuspid stenosis. Aortic Valve: The aortic valve is tricuspid. Aortic valve regurgitation is not visualized. No aortic stenosis is present. Pulmonic Valve: The pulmonic valve was normal in structure. Pulmonic valve regurgitation is not visualized. No evidence of pulmonic stenosis. Aorta: The aortic root is normal in size and structure. Venous: The inferior vena cava is normal in size  with greater than 50% respiratory variability, suggesting right atrial pressure of 3 mmHg. IAS/Shunts: The interatrial septum was not well visualized.  LEFT VENTRICLE PLAX 2D LVIDd:         3.35 cm     Diastology LVIDs:         2.30 cm     LV e' lateral:   8.38 cm/s LV PW:         1.72 cm     LV E/e' lateral: 6.9 LV IVS:        1.98 cm     LV e' medial:    5.87 cm/s LVOT diam:     2.10 cm     LV E/e' medial:  9.9 LV SV:         65.46 ml LV SV Index:   19.09 LVOT Area:     3.46 cm  LV Volumes (MOD) LV vol d, MOD A2C: 54.9 ml LV vol d, MOD A4C: 82.0 ml LV vol s, MOD A2C: 20.0 ml LV vol s, MOD A4C: 37.5 ml LV SV MOD A2C:     34.9 ml LV SV MOD A4C:     82.0 ml LV SV MOD BP:      37.7 ml RIGHT VENTRICLE RV S prime:     12.70 cm/s TAPSE (M-mode): 1.6 cm LEFT ATRIUM             Index       RIGHT ATRIUM           Index LA diam:        2.40 cm 1.67 cm/m  RA Area:     13.70 cm LA Vol (A2C):   31.9 ml 22.19 ml/m RA Volume:   36.90 ml  25.67 ml/m LA Vol (A4C):   21.6 ml 15.02 ml/m LA Biplane Vol: 26.9 ml 18.71 ml/m  AORTIC VALVE             PULMONIC VALVE LVOT Vmax:   112.00 cm/s PR End Diast Vel: 1.27 msec LVOT Vmean:  78.000 cm/s LVOT VTI:    0.189 m  AORTA Ao Root diam: 3.20 cm Ao Asc diam:  3.00 cm MITRAL VALVE               TRICUSPID VALVE MV Area (PHT): 3.60 cm    TR Peak grad:   23.6 mmHg MV Decel Time: 211 msec    TR  Vmax:        243.00 cm/s MV E velocity: 58.20 cm/s MV A velocity: 60.60 cm/s  SHUNTS MV E/A ratio:  0.96        Systemic VTI:  0.19 m                            Systemic Diam: 2.10 cm Jenkins Rouge MD Electronically signed by Jenkins Rouge MD Signature Date/Time: 03/19/2019/10:58:22 AM    Final      Subjective: Reports much stress in her life, primarily with her job, working at [employer] daily for the past 6 years and never missing a day  Discharge Exam: Vitals:   03/19/19 0955 03/19/19 1207  BP: 138/64 (!) 138/95  Pulse: 71 67  Resp:    Temp:    SpO2:     Vitals:   03/19/19  0435 03/19/19 0549 03/19/19 0955 03/19/19 1207  BP: (!) 154/67  138/64 (!) 138/95  Pulse: 78  71 67  Resp: 20     Temp: 98.9 F (37.2 C)     TempSrc: Oral     SpO2: 100%     Weight:  50.5 kg    Height:        General: Pt is alert, awake, not in acute distress Cardiovascular: RRR, S1/S2 +, no rubs, no gallops Respiratory: CTA bilaterally, no wheezing, no rhonchi Abdominal: Soft, NT, ND, bowel sounds + Extremities: no edema, no cyanosis   The results of significant diagnostics from this hospitalization (including imaging, microbiology, ancillary and laboratory) are listed below for reference.     Microbiology: Recent Results (from the past 240 hour(s))  SARS CORONAVIRUS 2 (TAT 6-24 HRS) Nasopharyngeal Nasopharyngeal Swab     Status: None   Collection Time: 03/18/19  2:00 PM   Specimen: Nasopharyngeal Swab  Result Value Ref Range Status   SARS Coronavirus 2 NEGATIVE NEGATIVE Final    Comment: (NOTE) SARS-CoV-2 target nucleic acids are NOT DETECTED. The SARS-CoV-2 RNA is generally detectable in upper and lower respiratory specimens during the acute phase of infection. Negative results do not preclude SARS-CoV-2 infection, do not rule out co-infections with other pathogens, and should not be used as the sole basis for treatment or other patient management decisions. Negative results must be combined with clinical observations, patient history, and epidemiological information. The expected result is Negative. Fact Sheet for Patients: SugarRoll.be Fact Sheet for Healthcare Providers: https://www.woods-mathews.com/ This test is not yet approved or cleared by the [place] FDA and  has been authorized for detection and/or diagnosis of SARS-CoV-2 by FDA under an Emergency Use Authorization (EUA). This EUA will remain  in effect (meaning this test can be used) for the duration of the COVID-19 declaration under Section 56 4(b)(1) of the  Act, 21 U.S.C. section 360bbb-3(b)(1), unless the authorization is terminated or revoked sooner. Performed at [facility], [place] [address], [place], [place] [address]      Labs: BNP (last 3 results) No results for input(s): BNP in the last 8760 hours. Basic Metabolic Panel: Recent Labs  Lab 03/18/19 0823 03/18/19 0853 03/18/19 1857 03/19/19 0503  NA 141  --   --  140  K 3.8  --   --  3.9  CL 107  --   --  107  CO2 22  --   --  22  GLUCOSE 94  --   --  100*  BUN 10  --   --  13  CREATININE 0.65  --  0.63 0.67  CALCIUM 9.6  --   --  9.0  MG  --  2.1  --   --    Liver Function Tests: Recent Labs  Lab 03/19/19 0503  AST 14*  ALT 12  ALKPHOS 77  BILITOT 0.9  PROT 6.9  ALBUMIN 3.7   No results for input(s): LIPASE, AMYLASE in the last 168 hours. No results for input(s): AMMONIA in the last 168 hours. CBC: Recent Labs  Lab 03/18/19 0823 03/18/19 1857 03/19/19 0503  WBC 8.7 6.6 5.4  HGB 14.7 12.2 12.4  HCT 46.0 38.0 39.2  MCV 93.1 92.2 94.0  PLT 370 341 340   Cardiac Enzymes: No results for input(s): CKTOTAL, CKMB, CKMBINDEX, TROPONINI in the last 168 hours. BNP: Invalid input(s): POCBNP CBG: Recent Labs  Lab 03/18/19 0851  GLUCAP 138*   D-Dimer No results for input(s): DDIMER in the last 72 hours. Hgb A1c Recent Labs    03/18/19 1857  HGBA1C 6.0*   Lipid Profile Recent Labs    03/18/19 1857  CHOL 200  HDL 47  LDLCALC 141*  TRIG 60  CHOLHDL 4.3   Thyroid function studies No results for input(s): TSH, T4TOTAL, T3FREE, THYROIDAB in the last 72 hours.  Invalid input(s): FREET3 Anemia work up No results for input(s): VITAMINB12, FOLATE, FERRITIN, TIBC, IRON, RETICCTPCT in the last 72 hours. Urinalysis    Component Value Date/Time   COLORURINE YELLOW 03/18/2019 1138   APPEARANCEUR HAZY (A) 03/18/2019 1138   LABSPEC 1.018 03/18/2019 1138   PHURINE 6.0 03/18/2019 1138   GLUCOSEU NEGATIVE 03/18/2019 1138   HGBUR NEGATIVE 03/18/2019  1138   BILIRUBINUR NEGATIVE 03/18/2019 1138   KETONESUR 5 (A) 03/18/2019 1138   PROTEINUR 30 (A) 03/18/2019 1138   UROBILINOGEN 0.2 08/28/2011 2143   NITRITE NEGATIVE 03/18/2019 1138   LEUKOCYTESUR MODERATE (A) 03/18/2019 1138   Sepsis Labs Invalid input(s): PROCALCITONIN,  WBC,  LACTICIDVEN Microbiology Recent Results (from the past 240 hour(s))  SARS CORONAVIRUS 2 (TAT 6-24 HRS) Nasopharyngeal Nasopharyngeal Swab     Status: None   Collection Time: 03/18/19  2:00 PM   Specimen: Nasopharyngeal Swab  Result Value Ref Range Status   SARS Coronavirus 2 NEGATIVE NEGATIVE Final    Comment: (NOTE) SARS-CoV-2 target nucleic acids are NOT DETECTED. The SARS-CoV-2 RNA is generally detectable in upper and lower respiratory specimens during the acute phase of infection. Negative results do not preclude SARS-CoV-2 infection, do not rule out co-infections with other pathogens, and should not be used as the sole basis for treatment or other patient management decisions. Negative results must be combined with clinical observations, patient history, and epidemiological information. The expected result is Negative. Fact Sheet for Patients: HairSlick.no Fact Sheet for Healthcare Providers: quierodirigir.com This test is not yet approved or cleared by the [place] FDA and  has been authorized for detection and/or diagnosis of SARS-CoV-2 by FDA under an Emergency Use Authorization (EUA). This EUA will remain  in effect (meaning this test can be used) for the duration of the COVID-19 declaration under Section 56 4(b)(1) of the Act, 21 U.S.C. section 360bbb-3(b)(1), unless the authorization is terminated or revoked sooner. Performed at [facility], [address] [place], [place] [address]    Time spent: 30 min  SIGNED:   Rickey Barbara, MD  [facility] 03/19/2019, 1:32 PM  If 7PM-7AM, please contact  night-coverage

## 2019-03-19 NOTE — Plan of Care (Signed)

## 2019-03-19 NOTE — TOC Initial Note (Signed)
Transition of Care [facility]) - Initial/Assessment Note    Patient Details  Name: Raven Buckley MRN: [id number] Date of Birth: 10/25/1955  Transition of Care [facility]) CM/SW Contact:    Armanda Heritage, RN Phone Number: 03/19/2019, 2:30 PM  Clinical Narrative:      CM spoke with patient who reports she does not have a pcp but has used the [facility] in the past.  CM discussed that patient can continue to see a provider at [facility] because they provide pcp services or patient can call her insurance provider for a list of in-network providers and establish care with one of them.  CM also spoke with patient regarding her prescriptions and explained that patient will have to provider her insurance card to the pharmacy so they can bill her insurance for the medication and that will reduce her out of pocket costs.  Should patient decide not to provide her insurance card, she can have her prescriptions transferred to the [facility] pharmacy, where the majority of her prescriptions will cost 4$.              Expected Discharge Plan: Home/Self Care Barriers to Discharge: No Barriers Identified   Patient Goals and CMS Choice Patient states their goals for this hospitalization and ongoing recovery are:: to go home      Expected Discharge Plan and Services Expected Discharge Plan: Home/Self Care   Discharge Planning Services: CM Consult   Living arrangements for the past 2 months: Apartment Expected Discharge Date: 03/19/19               DME Arranged: N/A DME Agency: NA       HH Arranged: NA HH Agency: NA        Prior Living Arrangements/Services Living arrangements for the past 2 months: Apartment   Patient language and need for interpreter reviewed:: Yes Do you feel safe going back to the place where you live?: Yes      Need for Family Participation in Patient Care: Yes (Comment) Care giver support system in place?: Yes (comment)   Criminal Activity/Legal Involvement Pertinent to Current  Situation/Hospitalization: No - Comment as needed  Activities of Daily Living Home Assistive Devices/Equipment: None ADL Screening (condition at time of admission) Patient's cognitive ability adequate to safely complete daily activities?: Yes Is the patient deaf or have difficulty hearing?: No Does the patient have difficulty seeing, even when wearing glasses/contacts?: No Does the patient have difficulty concentrating, remembering, or making decisions?: No Patient able to express need for assistance with ADLs?: Yes Does the patient have difficulty dressing or bathing?: No Independently performs ADLs?: Yes (appropriate for developmental age) Does the patient have difficulty walking or climbing stairs?: No Weakness of Legs: None Weakness of Arms/Hands: None  Permission Sought/Granted                  Emotional Assessment   Attitude/Demeanor/Rapport: Engaged Affect (typically observed): Accepting Orientation: : Oriented to Self, Oriented to Place, Oriented to  Time, Oriented to Situation   Psych Involvement: No (comment)  Admission diagnosis:  EKG abnormalities [R94.31] Acute cystitis with hematuria [N30.01] Near syncope [R55] Patient Active Problem List   Diagnosis Date Noted  . Near syncope 03/18/2019  . Hypertension 03/13/2016  . Syncope 08/29/2011   PCP:  Lizbeth Bark, FNP Pharmacy:   [facility] [phone], [place] - [phone] [facility] [address] [facility] [address] [place] [address] Phone: [phone] Fax: [phone]  [facility]  [place], [address] [place] [address] Phone: [phone] Fax: [phone]     Social Determinants of Health (SDOH) Interventions    Readmission Risk Interventions No flowsheet data found.

## 2019-04-17 ENCOUNTER — Encounter: Payer: Self-pay | Admitting: Family Medicine

## 2019-04-17 ENCOUNTER — Ambulatory Visit: Payer: 59 | Attending: Family Medicine | Admitting: Family Medicine

## 2019-04-17 ENCOUNTER — Other Ambulatory Visit: Payer: Self-pay

## 2019-04-17 VITALS — BP 192/98 | HR 74 | Ht 59.0 in | Wt 112.6 lb

## 2019-04-17 DIAGNOSIS — I10 Essential (primary) hypertension: Secondary | ICD-10-CM

## 2019-04-17 DIAGNOSIS — Z720 Tobacco use: Secondary | ICD-10-CM

## 2019-04-17 DIAGNOSIS — F17210 Nicotine dependence, cigarettes, uncomplicated: Secondary | ICD-10-CM | POA: Diagnosis not present

## 2019-04-17 DIAGNOSIS — R9431 Abnormal electrocardiogram [ECG] [EKG]: Secondary | ICD-10-CM | POA: Diagnosis not present

## 2019-04-17 DIAGNOSIS — R55 Syncope and collapse: Secondary | ICD-10-CM | POA: Diagnosis not present

## 2019-04-17 MED ORDER — LISINOPRIL 5 MG PO TABS: 5.0000 mg | ORAL_TABLET | Freq: Every day | ORAL | 3 refills | Status: DC

## 2019-04-17 MED ORDER — ATORVASTATIN CALCIUM 40 MG PO TABS: 40.0000 mg | ORAL_TABLET | Freq: Every day | ORAL | 3 refills | Status: DC

## 2019-04-17 MED ORDER — CLONIDINE HCL 0.1 MG PO TABS
0.1000 mg | ORAL_TABLET | Freq: Once | ORAL | Status: AC
  Administered 2019-04-17: 0.1 mg via ORAL

## 2019-04-17 MED ORDER — BUPROPION HCL ER (SR) 150 MG PO TB12: 150.0000 mg | ORAL_TABLET | Freq: Two times a day (BID) | ORAL | 3 refills | Status: DC

## 2019-04-17 MED ORDER — CARVEDILOL 12.5 MG PO TABS: 12.5000 mg | ORAL_TABLET | Freq: Two times a day (BID) | ORAL | 3 refills | Status: DC

## 2019-04-17 MED FILL — ATORVASTATIN CALCIUM 40 MG: 40 | 30 days supply | Qty: 30 | Fill #0

## 2019-04-17 MED FILL — BUPROPION SR 150 MG TABLET: 150 | 30 days supply | Qty: 60 | Fill #0

## 2019-04-17 MED FILL — LISINOPRIL 5 MG TABLET: 5 | 30 days supply | Qty: 30 | Fill #0

## 2019-04-17 MED FILL — CARVEDILOL 12.5 MG TABLET: 12.5 | 30 days supply | Qty: 60 | Fill #0

## 2019-04-17 NOTE — Progress Notes (Signed)
Subjective:  Patient ID: Raven Buckley, female    DOB: 09/23/1955  Age: 64 y.o. MRN: [id number]  CC: Hypertension   HPI Raven Buckley is a 64 year old female with a history of hypertension, tobacco abuse who presents today to establish care after hospitalization at [facility] from 03/18/2019 through 03/19/2019 for near syncope. Prolonged QTC of 528 ms noted on EKG along with LVH and strain pattern.  Urine drug screen was positive for cannabis.  Evaluated by cardiology and findings thought to be reflective of severe left ventricular hypertrophy in the setting of untreated hypertension. Echocardiogram revealed: IMPRESSIONS    1. Left ventricular ejection fraction, by estimation, is 60 to 65%. The  left ventricle has normal function. The left ventricle has no regional  wall motion abnormalities. The left ventricular internal cavity size was  mildly dilated. There is severely  increased left ventricular hypertrophy. Left ventricular diastolic  parameters were normal.  2. Right ventricular systolic function is normal. The right ventricular  size is normal. There is normal pulmonary artery systolic pressure.  3. Not well visualized cannot r/o PFO.  4. The mitral valve is normal in structure and function. Trivial mitral  valve regurgitation. No evidence of mitral stenosis.  5. The aortic valve is tricuspid. Aortic valve regurgitation is not  visualized. No aortic stenosis is present.  6. The inferior vena cava is normal in size with greater than 50%  respiratory variability, suggesting right atrial pressure of 3 mmHg.   She was commenced on antihypertensive and subsequently discharged.  She presents today accompanied by her brother and has been out of lisinopril but endorses taking carvedilol and atorvastatin. She has no additional concerns today. Past Medical History:  Diagnosis Date  . Hypertension     History reviewed. No pertinent surgical history.  History  reviewed. No pertinent family history.  Allergies  Allergen Reactions  . Penicillins Other (See Comments)    pt stated: "I don't know it just bothers me but I did not have a rash or swelling"      Outpatient Medications Prior to Visit  Medication Sig Dispense Refill  . nicotine (NICODERM CQ - DOSED IN MG/24 HOURS) 14 mg/24hr patch Place 1 patch (14 mg total) onto the skin daily. 28 patch 0  . atorvastatin (LIPITOR) 40 MG tablet Take 1 tablet (40 mg total) by mouth daily. 30 tablet 0  . carvedilol (COREG) 6.25 MG tablet Take 1 tablet (6.25 mg total) by mouth 2 (two) times daily with a meal. 60 tablet 0  . lisinopril (ZESTRIL) 5 MG tablet Take 1 tablet (5 mg total) by mouth daily. 30 tablet 0   No facility-administered medications prior to visit.     ROS Review of Systems  Constitutional: Negative for activity change, appetite change and fatigue.  HENT: Negative for congestion, sinus pressure and sore throat.   Eyes: Negative for visual disturbance.  Respiratory: Negative for cough, chest tightness, shortness of breath and wheezing.   Cardiovascular: Negative for chest pain and palpitations.  Gastrointestinal: Negative for abdominal distention, abdominal pain and constipation.  Endocrine: Negative for polydipsia.  Genitourinary: Negative for dysuria and frequency.  Musculoskeletal: Negative for arthralgias and back pain.  Skin: Negative for rash.  Neurological: Negative for tremors, light-headedness and numbness.  Hematological: Does not bruise/bleed easily.  Psychiatric/Behavioral: Negative for agitation and behavioral problems.    Objective:  BP (!) 214/92   Pulse 74   Ht 4\' 11"  (1.499 m)   Wt 112 lb 9.6  oz (51.1 kg)   SpO2 100%   BMI 22.74 kg/m   BP/Weight 04/17/2019 03/19/2019 03/13/2016  Systolic BP 214 138 134  Diastolic BP 92 95 70  Wt. (Lbs) 112.6 111.3 115.2  BMI 22.74 22.48 23.27      Physical Exam Constitutional:      Appearance: She is well-developed.   Neck:     Vascular: No JVD.  Cardiovascular:     Rate and Rhythm: Normal rate.     Heart sounds: Normal heart sounds. No murmur.  Pulmonary:     Effort: Pulmonary effort is normal.     Breath sounds: Normal breath sounds. No wheezing or rales.  Chest:     Chest wall: No tenderness.  Abdominal:     General: Bowel sounds are normal. There is no distension.     Palpations: Abdomen is soft. There is no mass.     Tenderness: There is no abdominal tenderness.  Musculoskeletal:        General: Normal range of motion.     Right lower leg: No edema.     Left lower leg: No edema.  Neurological:     Mental Status: She is alert and oriented to person, place, and time.  Psychiatric:        Mood and Affect: Mood normal.     CMP Latest Ref Rng & Units 03/19/2019 03/18/2019 03/18/2019  Glucose 70 - 99 mg/dL 841(L) - 94  BUN 8 - 23 mg/dL 13 - 10  Creatinine 2.44 - 1.00 mg/dL 0.10 2.72 5.36  Sodium 135 - 145 mmol/L 140 - 141  Potassium 3.5 - 5.1 mmol/L 3.9 - 3.8  Chloride 98 - 111 mmol/L 107 - 107  CO2 22 - 32 mmol/L 22 - 22  Calcium 8.9 - 10.3 mg/dL 9.0 - 9.6  Total Protein 6.5 - 8.1 g/dL 6.9 - -  Total Bilirubin 0.3 - 1.2 mg/dL 0.9 - -  Alkaline Phos 38 - 126 U/L 77 - -  AST 15 - 41 U/L 14(L) - -  ALT 0 - 44 U/L 12 - -    Lipid Panel     Component Value Date/Time   CHOL 200 03/18/2019 1857   TRIG 60 03/18/2019 1857   HDL 47 03/18/2019 1857   CHOLHDL 4.3 03/18/2019 1857   VLDL 12 03/18/2019 1857   LDLCALC 141 (H) 03/18/2019 1857    CBC    Component Value Date/Time   WBC 5.4 03/19/2019 0503   RBC 4.17 03/19/2019 0503   HGB 12.4 03/19/2019 0503   HCT 39.2 03/19/2019 0503   PLT 340 03/19/2019 0503   MCV 94.0 03/19/2019 0503   MCH 29.7 03/19/2019 0503   MCHC 31.6 03/19/2019 0503   RDW 13.4 03/19/2019 0503   LYMPHSABS 2.1 01/07/2016 1716   MONOABS 0.5 01/07/2016 1716   EOSABS 0.1 01/07/2016 1716   BASOSABS 0.0 01/07/2016 1716    Lab Results  Component Value Date    HGBA1C 6.0 (H) 03/18/2019    Assessment & Plan:  1. Accelerated hypertension Accelerated hypertension with high risk of progression to endorgan damage. Clonidine 0.2 mg administered in the clinic and patient observed for 30 minutes prior to repeating blood pressure Increased dose of carvedilol and lisinopril refill has been sent to the pharmacy Would love to place on calcium channel blocker however she has had challenges with regards to comprehension and I am having to repeat instructions over and over again to both the patient and her brother We will hold  off until next visit prior to initiating amlodipine if blood pressure is still not at goal Counseled on blood pressure goal of less than 130/80, low-sodium, DASH diet, medication compliance, 150 minutes of moderate intensity exercise per week. Discussed medication compliance, adverse effects. - carvedilol (COREG) 12.5 MG tablet; Take 1 tablet (12.5 mg total) by mouth 2 (two) times daily with a meal.  Dispense: 60 tablet; Refill: 3 - lisinopril (ZESTRIL) 5 MG tablet; Take 1 tablet (5 mg total) by mouth daily.  Dispense: 30 tablet; Refill: 3  2. Tobacco abuse Spent 3 minutes counseling on smoking cessation and she is willing to work on quitting - buPROPion (WELLBUTRIN SR) 150 MG 12 hr tablet; Take 1 tablet (150 mg total) by mouth 2 (two) times daily. For smoking cessation  Dispense: 60 tablet; Refill: 3  3. Near syncope Asymptomatic  4.  Prolonged QT Noticed on EKG from the ED Unknown inciting agent We will avoid QT prolonging agents    Meds ordered this encounter  Medications  . carvedilol (COREG) 12.5 MG tablet    Sig: Take 1 tablet (12.5 mg total) by mouth 2 (two) times daily with a meal.    Dispense:  60 tablet    Refill:  3    Dose change  . lisinopril (ZESTRIL) 5 MG tablet    Sig: Take 1 tablet (5 mg total) by mouth daily.    Dispense:  30 tablet    Refill:  3  . atorvastatin (LIPITOR) 40 MG tablet    Sig: Take 1 tablet  (40 mg total) by mouth daily.    Dispense:  30 tablet    Refill:  3  . buPROPion (WELLBUTRIN SR) 150 MG 12 hr tablet    Sig: Take 1 tablet (150 mg total) by mouth 2 (two) times daily. For smoking cessation    Dispense:  60 tablet    Refill:  3    Follow-up: Return in about 1 month (around 05/18/2019) for Hypertension.       Charlott Rakes, MD, FAAFP. [facility] and [facility], [place]   04/17/2019, 3:08 PM

## 2019-04-17 NOTE — Patient Instructions (Addendum)
Increase carvedilol to 12.5 mg twice daily Continue lisinopril 5 mg daily Continue atorvastatin 40 mg daily Start Wellbutrin for smoking cessation   Hypertension, Adult High blood pressure (hypertension) is when the force of blood pumping through the arteries is too strong. The arteries are the blood vessels that carry blood from the heart throughout the body. Hypertension forces the heart to work harder to pump blood and may cause arteries to become narrow or stiff. Untreated or uncontrolled hypertension can cause a heart attack, heart failure, a stroke, kidney disease, and other problems. A blood pressure reading consists of a higher number over a lower number. Ideally, your blood pressure should be below 120/80. The first ("top") number is called the systolic pressure. It is a measure of the pressure in your arteries as your heart beats. The second ("bottom") number is called the diastolic pressure. It is a measure of the pressure in your arteries as the heart relaxes. What are the causes? The exact cause of this condition is not known. There are some conditions that result in or are related to high blood pressure. What increases the risk? Some risk factors for high blood pressure are under your control. The following factors may make you more likely to develop this condition:  Smoking.  Having type 2 diabetes mellitus, high cholesterol, or both.  Not getting enough exercise or physical activity.  Being overweight.  Having too much fat, sugar, calories, or salt (sodium) in your diet.  Drinking too much alcohol. Some risk factors for high blood pressure may be difficult or impossible to change. Some of these factors include:  Having chronic kidney disease.  Having a family history of high blood pressure.  Age. Risk increases with age.  Race. You may be at higher risk if you are African American.  Gender. Men are at higher risk than women before age 82. After age 91, women are at  higher risk than men.  Having obstructive sleep apnea.  Stress. What are the signs or symptoms? High blood pressure may not cause symptoms. Very high blood pressure (hypertensive crisis) may cause:  Headache.  Anxiety.  Shortness of breath.  Nosebleed.  Nausea and vomiting.  Vision changes.  Severe chest pain.  Seizures. How is this diagnosed? This condition is diagnosed by measuring your blood pressure while you are seated, with your arm resting on a flat surface, your legs uncrossed, and your feet flat on the floor. The cuff of the blood pressure monitor will be placed directly against the skin of your upper arm at the level of your heart. It should be measured at least twice using the same arm. Certain conditions can cause a difference in blood pressure between your right and left arms. Certain factors can cause blood pressure readings to be lower or higher than normal for a short period of time:  When your blood pressure is higher when you are in a health care provider's office than when you are at home, this is called white coat hypertension. Most people with this condition do not need medicines.  When your blood pressure is higher at home than when you are in a health care provider's office, this is called masked hypertension. Most people with this condition may need medicines to control blood pressure. If you have a high blood pressure reading during one visit or you have normal blood pressure with other risk factors, you may be asked to:  Return on a different day to have your blood pressure checked again.  Monitor your blood pressure at home for 1 week or longer. If you are diagnosed with hypertension, you may have other blood or imaging tests to help your health care provider understand your overall risk for other conditions. How is this treated? This condition is treated by making healthy lifestyle changes, such as eating healthy foods, exercising more, and reducing  your alcohol intake. Your health care provider may prescribe medicine if lifestyle changes are not enough to get your blood pressure under control, and if:  Your systolic blood pressure is above 130.  Your diastolic blood pressure is above 80. Your personal target blood pressure may vary depending on your medical conditions, your age, and other factors. Follow these instructions at home: Eating and drinking   Eat a diet that is high in fiber and potassium, and low in sodium, added sugar, and fat. An example eating plan is called the DASH (Dietary Approaches to Stop Hypertension) diet. To eat this way: ? Eat plenty of fresh fruits and vegetables. Try to fill one half of your plate at each meal with fruits and vegetables. ? Eat whole grains, such as whole-wheat pasta, brown rice, or whole-grain bread. Fill about one fourth of your plate with whole grains. ? Eat or drink low-fat dairy products, such as skim milk or low-fat yogurt. ? Avoid fatty cuts of meat, processed or cured meats, and poultry with skin. Fill about one fourth of your plate with lean proteins, such as fish, chicken without skin, beans, eggs, or tofu. ? Avoid pre-made and processed foods. These tend to be higher in sodium, added sugar, and fat.  Reduce your daily sodium intake. Most people with hypertension should eat less than 1,500 mg of sodium a day.  Do not drink alcohol if: ? Your health care provider tells you not to drink. ? You are pregnant, may be pregnant, or are planning to become pregnant.  If you drink alcohol: ? Limit how much you use to:  0-1 drink a day for women.  0-2 drinks a day for men. ? Be aware of how much alcohol is in your drink. In the [place] one drink equals one 12 oz bottle of beer (355 mL), one 5 oz glass of wine (148 mL), or one 1 oz glass of hard liquor (44 mL). Lifestyle   Work with your health care provider to maintain a healthy body weight or to lose weight. Ask what an ideal weight is  for you.  Get at least 30 minutes of exercise most days of the week. Activities may include walking, swimming, or biking.  Include exercise to strengthen your muscles (resistance exercise), such as Pilates or lifting weights, as part of your weekly exercise routine. Try to do these types of exercises for 30 minutes at least 3 days a week.  Do not use any products that contain nicotine or tobacco, such as cigarettes, e-cigarettes, and chewing tobacco. If you need help quitting, ask your health care provider.  Monitor your blood pressure at home as told by your health care provider.  Keep all follow-up visits as told by your health care provider. This is important. Medicines  Take over-the-counter and prescription medicines only as told by your health care provider. Follow directions carefully. Blood pressure medicines must be taken as prescribed.  Do not skip doses of blood pressure medicine. Doing this puts you at risk for problems and can make the medicine less effective.  Ask your health care provider about side effects or reactions to medicines  that you should watch for. Contact a health care provider if you:  Think you are having a reaction to a medicine you are taking.  Have headaches that keep coming back (recurring).  Feel dizzy.  Have swelling in your ankles.  Have trouble with your vision. Get help right away if you:  Develop a severe headache or confusion.  Have unusual weakness or numbness.  Feel faint.  Have severe pain in your chest or abdomen.  Vomit repeatedly.  Have trouble breathing. Summary  Hypertension is when the force of blood pumping through your arteries is too strong. If this condition is not controlled, it may put you at risk for serious complications.  Your personal target blood pressure may vary depending on your medical conditions, your age, and other factors. For most people, a normal blood pressure is less than 120/80.  Hypertension is  treated with lifestyle changes, medicines, or a combination of both. Lifestyle changes include losing weight, eating a healthy, low-sodium diet, exercising more, and limiting alcohol. This information is not intended to replace advice given to you by your health care provider. Make sure you discuss any questions you have with your health care provider. Document Revised: 09/29/2017 Document Reviewed: 09/29/2017 Elsevier Patient Education  2020 [employer].

## 2019-05-22 ENCOUNTER — Ambulatory Visit: Payer: 59 | Attending: Family Medicine | Admitting: Family Medicine

## 2019-05-22 ENCOUNTER — Encounter: Payer: Self-pay | Admitting: Family Medicine

## 2019-05-22 ENCOUNTER — Other Ambulatory Visit: Payer: Self-pay

## 2019-05-22 VITALS — BP 220/98 | HR 63 | Ht 59.0 in | Wt 112.0 lb

## 2019-05-22 DIAGNOSIS — Z9114 Patient's other noncompliance with medication regimen: Secondary | ICD-10-CM

## 2019-05-22 DIAGNOSIS — I10 Essential (primary) hypertension: Secondary | ICD-10-CM

## 2019-05-22 MED FILL — BUPROPION SR 150 MG TABLET: 150 | 30 days supply | Qty: 60 | Fill #1

## 2019-05-22 MED FILL — CARVEDILOL 12.5 MG TABLET: 12.5 | 30 days supply | Qty: 60 | Fill #1

## 2019-05-22 MED FILL — ATORVASTATIN CALCIUM 40 MG: 40 | 30 days supply | Qty: 30 | Fill #1

## 2019-05-22 MED FILL — LISINOPRIL 5 MG TABLET: 5 | 30 days supply | Qty: 30 | Fill #1

## 2019-05-22 NOTE — Patient Instructions (Signed)

## 2019-05-22 NOTE — Progress Notes (Signed)
Needs refill on all medications. 

## 2019-05-22 NOTE — Progress Notes (Signed)
Subjective:  Patient ID: Raven Buckley, female    DOB: 1956-01-04  Age: 64 y.o. MRN: [id number]  CC: Hypertension   HPI Raven Buckley is a 64 year old female with a history of hypertension, tobacco abuse here today for follow-up visit. At her last visit she had presented with accelerated hypertension which was treated with clonidine 0.2 mg and her medications were refilled.  She presents today with an elevated blood pressure of 220/98 and heart rate of 63. She has her medication bottles with her lisinopril (which is empty) but she does have carvedilol tablets which she did not take today and has no reason for not complying with her medications. She does not have her Lipitor. She informs me she quit smoking on 04/12/2019 and is using nicotine patches but does not need Wellbutrin. She has no additional concerns today.  Past Medical History:  Diagnosis Date  . Hypertension     No past surgical history on file.  No family history on file.  Allergies  Allergen Reactions  . Penicillins Other (See Comments)    pt stated: "I don't know it just bothers me but I did not have a rash or swelling"      Outpatient Medications Prior to Visit  Medication Sig Dispense Refill  . atorvastatin (LIPITOR) 40 MG tablet Take 1 tablet (40 mg total) by mouth daily. 30 tablet 3  . buPROPion (WELLBUTRIN SR) 150 MG 12 hr tablet Take 1 tablet (150 mg total) by mouth 2 (two) times daily. For smoking cessation (Patient not taking: Reported on 05/22/2019) 60 tablet 3  . carvedilol (COREG) 12.5 MG tablet Take 1 tablet (12.5 mg total) by mouth 2 (two) times daily with a meal. 60 tablet 3  . lisinopril (ZESTRIL) 5 MG tablet Take 1 tablet (5 mg total) by mouth daily. 30 tablet 3  . nicotine (NICODERM CQ - DOSED IN MG/24 HOURS) 14 mg/24hr patch Place 1 patch (14 mg total) onto the skin daily. (Patient not taking: Reported on 05/22/2019) 28 patch 0   No facility-administered medications prior to visit.      ROS Review of Systems  Constitutional: Negative for activity change, appetite change and fatigue.  HENT: Negative for congestion, sinus pressure and sore throat.   Eyes: Negative for visual disturbance.  Respiratory: Negative for cough, chest tightness, shortness of breath and wheezing.   Cardiovascular: Negative for chest pain and palpitations.  Gastrointestinal: Negative for abdominal distention, abdominal pain and constipation.  Endocrine: Negative for polydipsia.  Genitourinary: Negative for dysuria and frequency.  Musculoskeletal: Negative for arthralgias and back pain.  Skin: Negative for rash.  Neurological: Negative for tremors, light-headedness and numbness.  Hematological: Does not bruise/bleed easily.  Psychiatric/Behavioral: Negative for agitation and behavioral problems.    Objective:  BP (!) 220/98   Pulse 63   Ht 4\' 11"  (1.499 m)   Wt 112 lb (50.8 kg)   SpO2 99%   BMI 22.62 kg/m   BP/Weight 05/22/2019 04/17/2019 03/19/2019  Systolic BP 220 192 138  Diastolic BP 98 98 95  Wt. (Lbs) 112 112.6 111.3  BMI 22.62 22.74 22.48      Physical Exam Constitutional:      Appearance: She is well-developed.  Neck:     Vascular: No JVD.  Cardiovascular:     Rate and Rhythm: Normal rate.     Heart sounds: Normal heart sounds. No murmur.  Pulmonary:     Effort: Pulmonary effort is normal.     Breath sounds: Normal  breath sounds. No wheezing or rales.  Chest:     Chest wall: No tenderness.  Abdominal:     General: Bowel sounds are normal. There is no distension.     Palpations: Abdomen is soft. There is no mass.     Tenderness: There is no abdominal tenderness.  Musculoskeletal:        General: Normal range of motion.     Right lower leg: No edema.     Left lower leg: No edema.  Neurological:     Mental Status: She is alert and oriented to person, place, and time.  Psychiatric:        Mood and Affect: Mood normal.     CMP Latest Ref Rng & Units  03/19/2019 03/18/2019 03/18/2019  Glucose 70 - 99 mg/dL 100(H) - 94  BUN 8 - 23 mg/dL 13 - 10  Creatinine 0.44 - 1.00 mg/dL 0.67 0.63 0.65  Sodium 135 - 145 mmol/L 140 - 141  Potassium 3.5 - 5.1 mmol/L 3.9 - 3.8  Chloride 98 - 111 mmol/L 107 - 107  CO2 22 - 32 mmol/L 22 - 22  Calcium 8.9 - 10.3 mg/dL 9.0 - 9.6  Total Protein 6.5 - 8.1 g/dL 6.9 - -  Total Bilirubin 0.3 - 1.2 mg/dL 0.9 - -  Alkaline Phos 38 - 126 U/L 77 - -  AST 15 - 41 U/L 14(L) - -  ALT 0 - 44 U/L 12 - -    Lipid Panel     Component Value Date/Time   CHOL 200 03/18/2019 1857   TRIG 60 03/18/2019 1857   HDL 47 03/18/2019 1857   CHOLHDL 4.3 03/18/2019 1857   VLDL 12 03/18/2019 1857   LDLCALC 141 (H) 03/18/2019 1857    CBC    Component Value Date/Time   WBC 5.4 03/19/2019 0503   RBC 4.17 03/19/2019 0503   HGB 12.4 03/19/2019 0503   HCT 39.2 03/19/2019 0503   PLT 340 03/19/2019 0503   MCV 94.0 03/19/2019 0503   MCH 29.7 03/19/2019 0503   MCHC 31.6 03/19/2019 0503   RDW 13.4 03/19/2019 0503   LYMPHSABS 2.1 01/07/2016 1716   MONOABS 0.5 01/07/2016 1716   EOSABS 0.1 01/07/2016 1716   BASOSABS 0.0 01/07/2016 1716    Lab Results  Component Value Date   HGBA1C 6.0 (H) 03/18/2019    Assessment & Plan:  1. Accelerated hypertension Uncontrolled due to noncompliance Unable to administer clonidine in the clinic due to low heart rate She has been directed to the pharmacy to pick up her medications Comprehension seems to be an issue. She will have a visit with the clinical pharmacist at which time if blood pressure remains elevated consider increasing lisinopril dose Counseled on blood pressure goal of less than 130/80, low-sodium, DASH diet, medication compliance, 150 minutes of moderate intensity exercise per week. Discussed medication compliance, adverse effects.  2. Non compliance w medication regimen Stressed importance of compliance Discussed complications uncontrolled hypertension  Return in about 2  weeks (around 06/05/2019) for Hypertension.      Charlott Rakes, MD, FAAFP. [facility] and [facility], [place]   05/22/2019, 11:51 AM

## 2019-06-05 ENCOUNTER — Other Ambulatory Visit: Payer: Self-pay

## 2019-06-05 ENCOUNTER — Ambulatory Visit: Payer: 59 | Attending: Family Medicine | Admitting: Pharmacist

## 2019-06-05 ENCOUNTER — Encounter: Payer: Self-pay | Admitting: Pharmacist

## 2019-06-05 VITALS — BP 176/89 | HR 67

## 2019-06-05 DIAGNOSIS — I10 Essential (primary) hypertension: Secondary | ICD-10-CM | POA: Diagnosis not present

## 2019-06-05 MED ORDER — LISINOPRIL 10 MG PO TABS: 10.0000 mg | ORAL_TABLET | Freq: Every day | ORAL | 2 refills | Status: DC

## 2019-06-05 MED FILL — LISINOPRIL 10 MG TABS: 10 | 30 days supply | Qty: 30 | Fill #0

## 2019-06-05 NOTE — Progress Notes (Signed)
   S:    PCP: Dr. Alvis Lemmings   Patient arrives in good spirits. Presents to the clinic for BP check.  Patient was referred and last seen by Primary Care Provider on 05/22/2019.    Medication adherence: confirms. Brings bottles of atorvastatin, lisinopril, and carvedilol.   Denies chest pain, dyspnea, HA or blurred vision. No LE edema.   Current BP Medications include:  Carvedilol 12.5 mg BID, lisinopril 5 mg daily   Dietary habits include: non-compliant with salt restriction; denies drinking caffeine  Exercise habits include: none  Family / Social history:  - Fhx: no pertinent positives listed - Tobacco: current 0.5 PPD smoker  - Alcohol: denies use  O:  Vitals:   06/05/19 1106  BP: (!) 176/89  Pulse: 67   Home BP readings: none   Last 3 Office BP readings: BP Readings from Last 3 Encounters:  06/05/19 (!) 176/89  05/22/19 (!) 220/98  04/17/19 (!) 192/98   BMET    Component Value Date/Time   NA 140 03/19/2019 0503   K 3.9 03/19/2019 0503   CL 107 03/19/2019 0503   CO2 22 03/19/2019 0503   GLUCOSE 100 (H) 03/19/2019 0503   BUN 13 03/19/2019 0503   CREATININE 0.67 03/19/2019 0503   CALCIUM 9.0 03/19/2019 0503   GFRNONAA >60 03/19/2019 0503   GFRAA >60 03/19/2019 0503    Renal function: CrCl cannot be calculated (Patient's most recent lab result is older than the maximum 21 days allowed.).  Clinical ASCVD: No  The 10-year ASCVD risk score Denman George DC Jr., et al., 2013) is: 34.2%   Values used to calculate the score:     Age: 89 years     Sex: Female     Is Non-Hispanic African American: Yes     Diabetic: No     Tobacco smoker: Yes     Systolic Blood Pressure: 176 mmHg     Is BP treated: Yes     HDL Cholesterol: 47 mg/dL     Total Cholesterol: 200 mg/dL  A/P: Hypertension longstanding currently uncontrolled on current medications. BP in clinic has improved with improved medication adherence. BP Goal = < 130/80 mmHg. Medication adherence: reports no missed doses  since last PCP visit.    I reviewed her chart and her PCP has expressed desire to add CCB but there are known challenges regarding adherence. I would like to add CCB today, but it was noted at patient's last office visit that we could consider increasing lisinopril dose vs adding another medication to minimize confusion. Pt would definitely benefit from amlodipine, but will increase lisinopril dose and hold off on adding amlodipine until she is seen by her PCP next month.   Of note, I accompanied patient to the pharmacy today. I made sure she picked up her higher dose of lisinopril. I have discarded the previous bottle to minimize confusion.   -Increased dose of lisinopril to 10 mg daily. -Continue carvedilol at current dose.   -Counseled on lifestyle modifications for blood pressure control including reduced dietary sodium, increased exercise, adequate sleep.  Results reviewed and written information provided.   Total time in face-to-face counseling 15 minutes.   F/U Clinic Visit next month with PCP.   Butch Penny, PharmD, CPP Clinical Pharmacist [facility] [phone]

## 2019-07-17 ENCOUNTER — Other Ambulatory Visit: Payer: Self-pay

## 2019-07-17 ENCOUNTER — Ambulatory Visit: Payer: 59 | Attending: Family Medicine | Admitting: Family Medicine

## 2019-07-17 ENCOUNTER — Encounter: Payer: Self-pay | Admitting: Family Medicine

## 2019-07-17 VITALS — BP 190/110 | HR 90 | Ht 59.0 in | Wt 109.4 lb

## 2019-07-17 DIAGNOSIS — Z1159 Encounter for screening for other viral diseases: Secondary | ICD-10-CM | POA: Diagnosis not present

## 2019-07-17 DIAGNOSIS — I10 Essential (primary) hypertension: Secondary | ICD-10-CM

## 2019-07-17 DIAGNOSIS — H6123 Impacted cerumen, bilateral: Secondary | ICD-10-CM | POA: Diagnosis not present

## 2019-07-17 MED ORDER — CARVEDILOL 12.5 MG PO TABS: 12.5000 mg | ORAL_TABLET | Freq: Two times a day (BID) | ORAL | 3 refills | Status: DC

## 2019-07-17 MED ORDER — CLONIDINE HCL 0.1 MG PO TABS
0.1000 mg | ORAL_TABLET | Freq: Once | ORAL | Status: AC
  Administered 2019-07-17: 0.1 mg via ORAL

## 2019-07-17 MED ORDER — ATORVASTATIN CALCIUM 40 MG PO TABS: 40.0000 mg | ORAL_TABLET | Freq: Every day | ORAL | 3 refills | Status: DC

## 2019-07-17 MED ORDER — LISINOPRIL 10 MG PO TABS: 10.0000 mg | ORAL_TABLET | Freq: Every day | ORAL | 3 refills | Status: DC

## 2019-07-17 MED ORDER — AMLODIPINE BESYLATE 10 MG PO TABS: 10.0000 mg | ORAL_TABLET | Freq: Every day | ORAL | 3 refills | Status: DC

## 2019-07-17 MED FILL — ATORVASTATIN CALCIUM 40 MG: 40 | 30 days supply | Qty: 30 | Fill #0

## 2019-07-17 MED FILL — LISINOPRIL 10 MG TABS: 10 | 30 days supply | Qty: 30 | Fill #0

## 2019-07-17 MED FILL — CARVEDILOL 12.5 MG TABLET: 12.5 | 30 days supply | Qty: 60 | Fill #0

## 2019-07-17 MED FILL — AMLODIPINE BESYLATE 10 MG T: 10 | 30 days supply | Qty: 30 | Fill #0

## 2019-07-17 NOTE — Progress Notes (Signed)
Subjective:  Patient ID: Raven Buckley, female    DOB: 1955-12-12  Age: 64 y.o. MRN: [id number]  CC: Hypertension   HPI Raven Buckley is a 64 year old female with a history of hypertension, tobacco abuse here today for follow-up visit. Her blood pressure is significantly elevated and she endorses compliance with her antihypertensive.  At her last visit with the clinical pharmacist last month her dose of lisinopril was increased from 5 mg to 10 mg.  She informs me she took her last dose of her antihypertensive this morning. She complains of hearing loss bilaterally and is wondering if her ears can be checked.  She denies presence of ear pain, upper respiratory symptoms.  Past Medical History:  Diagnosis Date  . Hypertension     History reviewed. No pertinent surgical history.  History reviewed. No pertinent family history.  Allergies  Allergen Reactions  . Penicillins Other (See Comments)    pt stated: "I don't know it just bothers me but I did not have a rash or swelling"      Outpatient Medications Prior to Visit  Medication Sig Dispense Refill  . lisinopril (ZESTRIL) 10 MG tablet Take 1 tablet (10 mg total) by mouth daily. 30 tablet 2  . buPROPion (WELLBUTRIN SR) 150 MG 12 hr tablet Take 1 tablet (150 mg total) by mouth 2 (two) times daily. For smoking cessation (Patient not taking: Reported on 05/22/2019) 60 tablet 3  . nicotine (NICODERM CQ - DOSED IN MG/24 HOURS) 14 mg/24hr patch Place 1 patch (14 mg total) onto the skin daily. (Patient not taking: Reported on 05/22/2019) 28 patch 0  . atorvastatin (LIPITOR) 40 MG tablet Take 1 tablet (40 mg total) by mouth daily. 30 tablet 3  . carvedilol (COREG) 12.5 MG tablet Take 1 tablet (12.5 mg total) by mouth 2 (two) times daily with a meal. 60 tablet 3   No facility-administered medications prior to visit.     ROS Review of Systems  Constitutional: Negative for activity change, appetite change and fatigue.  HENT: Negative  for congestion, sinus pressure and sore throat.   Eyes: Negative for visual disturbance.  Respiratory: Negative for cough, chest tightness, shortness of breath and wheezing.   Cardiovascular: Negative for chest pain and palpitations.  Gastrointestinal: Negative for abdominal distention, abdominal pain and constipation.  Endocrine: Negative for polydipsia.  Genitourinary: Negative for dysuria and frequency.  Musculoskeletal: Negative for arthralgias and back pain.  Skin: Negative for rash.  Neurological: Negative for tremors, light-headedness and numbness.  Hematological: Does not bruise/bleed easily.  Psychiatric/Behavioral: Negative for agitation and behavioral problems.    Objective:  BP (!) 190/110   Pulse 90   Ht 4\' 11"  (1.499 m)   Wt 109 lb 6.4 oz (49.6 kg)   SpO2 100%   BMI 22.10 kg/m   BP/Weight 07/17/2019 06/05/2019 05/22/2019  Systolic BP 190 176 220  Diastolic BP 110 89 98  Wt. (Lbs) 109.4 - 112  BMI 22.1 - 22.62      Physical Exam Constitutional:      Appearance: She is well-developed.  HENT:     Ears:     Comments: Cerumen obscuring b/l TM Neck:     Vascular: No JVD.  Cardiovascular:     Rate and Rhythm: Normal rate.     Heart sounds: Normal heart sounds. No murmur heard.   Pulmonary:     Effort: Pulmonary effort is normal.     Breath sounds: Normal breath sounds. No wheezing or rales.  Chest:     Chest wall: No tenderness.  Abdominal:     General: Bowel sounds are normal. There is no distension.     Palpations: Abdomen is soft. There is no mass.     Tenderness: There is no abdominal tenderness.  Musculoskeletal:        General: Normal range of motion.     Right lower leg: No edema.     Left lower leg: No edema.  Neurological:     Mental Status: She is alert and oriented to person, place, and time.  Psychiatric:        Mood and Affect: Mood normal.     CMP Latest Ref Rng & Units 03/19/2019 03/18/2019 03/18/2019  Glucose 70 - 99 mg/dL 122(Q) - 94    BUN 8 - 23 mg/dL 13 - 10  Creatinine 8.25 - 1.00 mg/dL 0.03 7.04 8.88  Sodium 135 - 145 mmol/L 140 - 141  Potassium 3.5 - 5.1 mmol/L 3.9 - 3.8  Chloride 98 - 111 mmol/L 107 - 107  CO2 22 - 32 mmol/L 22 - 22  Calcium 8.9 - 10.3 mg/dL 9.0 - 9.6  Total Protein 6.5 - 8.1 g/dL 6.9 - -  Total Bilirubin 0.3 - 1.2 mg/dL 0.9 - -  Alkaline Phos 38 - 126 U/L 77 - -  AST 15 - 41 U/L 14(L) - -  ALT 0 - 44 U/L 12 - -    Lipid Panel     Component Value Date/Time   CHOL 200 03/18/2019 1857   TRIG 60 03/18/2019 1857   HDL 47 03/18/2019 1857   CHOLHDL 4.3 03/18/2019 1857   VLDL 12 03/18/2019 1857   LDLCALC 141 (H) 03/18/2019 1857    CBC    Component Value Date/Time   WBC 5.4 03/19/2019 0503   RBC 4.17 03/19/2019 0503   HGB 12.4 03/19/2019 0503   HCT 39.2 03/19/2019 0503   PLT 340 03/19/2019 0503   MCV 94.0 03/19/2019 0503   MCH 29.7 03/19/2019 0503   MCHC 31.6 03/19/2019 0503   RDW 13.4 03/19/2019 0503   LYMPHSABS 2.1 01/07/2016 1716   MONOABS 0.5 01/07/2016 1716   EOSABS 0.1 01/07/2016 1716   BASOSABS 0.0 01/07/2016 1716    Lab Results  Component Value Date   HGBA1C 6.0 (H) 03/18/2019    Assessment & Plan:  1. Accelerated hypertension Clonidine 0.1 mg administered in the clinic We will add on amlodipine to her regimen - carvedilol (COREG) 12.5 MG tablet; Take 1 tablet (12.5 mg total) by mouth 2 (two) times daily with a meal.  Dispense: 60 tablet; Refill: 3 - lisinopril (ZESTRIL) 10 MG tablet; Take 1 tablet (10 mg total) by mouth daily.  Dispense: 30 tablet; Refill: 3 - Basic Metabolic Panel  2. Need for hepatitis C screening test - HCV RNA quant rflx ultra or genotyp([facility])  3. Bilateral impacted cerumen Bilateral ear irrigation performed   Meds ordered this encounter  Medications  . amLODipine (NORVASC) 10 MG tablet    Sig: Take 1 tablet (10 mg total) by mouth daily.    Dispense:  30 tablet    Refill:  3  . atorvastatin (LIPITOR) 40 MG tablet    Sig:  Take 1 tablet (40 mg total) by mouth daily.    Dispense:  30 tablet    Refill:  3  . carvedilol (COREG) 12.5 MG tablet    Sig: Take 1 tablet (12.5 mg total) by mouth 2 (two) times daily with a meal.  Dispense:  60 tablet    Refill:  3    Dose change  . lisinopril (ZESTRIL) 10 MG tablet    Sig: Take 1 tablet (10 mg total) by mouth daily.    Dispense:  30 tablet    Refill:  3    Follow-up: Return in about 1 month (around 08/16/2019) for Hypertension.       Charlott Rakes, MD, FAAFP. [facility] and [place], [place]   07/17/2019, 11:49 AM

## 2019-07-17 NOTE — Patient Instructions (Signed)
Earwax Buildup, Adult The ears produce a substance called earwax that helps keep bacteria out of the ear and protects the skin in the ear canal. Occasionally, earwax can build up in the ear and cause discomfort or hearing loss. What increases the risk? This condition is more likely to develop in people who:  Are female.  Are elderly.  Naturally produce more earwax.  Clean their ears often with cotton swabs.  Use earplugs often.  Use in-ear headphones often.  Wear hearing aids.  Have narrow ear canals.  Have earwax that is overly thick or sticky.  Have eczema.  Are dehydrated.  Have excess hair in the ear canal. What are the signs or symptoms? Symptoms of this condition include:  Reduced or muffled hearing.  A feeling of fullness in the ear or feeling that the ear is plugged.  Fluid coming from the ear.  Ear pain.  Ear itch.  Ringing in the ear.  Coughing.  An obvious piece of earwax that can be seen inside the ear canal. How is this diagnosed? This condition may be diagnosed based on:  Your symptoms.  Your medical history.  An ear exam. During the exam, your health care provider will look into your ear with an instrument called an otoscope. You may have tests, including a hearing test. How is this treated? This condition may be treated by:  Using ear drops to soften the earwax.  Having the earwax removed by a health care provider. The health care provider may: ? Flush the ear with water. ? Use an instrument that has a loop on the end (curette). ? Use a suction device.  Surgery to remove the wax buildup. This may be done in severe cases. Follow these instructions at home:   Take over-the-counter and prescription medicines only as told by your health care provider.  Do not put any objects, including cotton swabs, into your ear. You can clean the opening of your ear canal with a washcloth or facial tissue.  Follow instructions from your health care  provider about cleaning your ears. Do not over-clean your ears.  Drink enough fluid to keep your urine clear or pale yellow. This will help to thin the earwax.  Keep all follow-up visits as told by your health care provider. If earwax builds up in your ears often or if you use hearing aids, consider seeing your health care provider for routine, preventive ear cleanings. Ask your health care provider how often you should schedule your cleanings.  If you have hearing aids, clean them according to instructions from the manufacturer and your health care provider. Contact a health care provider if:  You have ear pain.  You develop a fever.  You have blood, pus, or other fluid coming from your ear.  You have hearing loss.  You have ringing in your ears that does not go away.  Your symptoms do not improve with treatment.  You feel like the room is spinning (vertigo). Summary  Earwax can build up in the ear and cause discomfort or hearing loss.  The most common symptoms of this condition include reduced or muffled hearing and a feeling of fullness in the ear or feeling that the ear is plugged.  This condition may be diagnosed based on your symptoms, your medical history, and an ear exam.  This condition may be treated by using ear drops to soften the earwax or by having the earwax removed by a health care provider.  Do not put any   objects, including cotton swabs, into your ear. You can clean the opening of your ear canal with a washcloth or facial tissue. This information is not intended to replace advice given to you by your health care provider. Make sure you discuss any questions you have with your health care provider. Document Revised: 01/01/2017 Document Reviewed: 04/01/2016 Elsevier Patient Education  2020 Elsevier Inc.  

## 2019-07-18 LAB — HCV RNA QUANT RFLX ULTRA OR GENOTYP: HCV Quant Baseline: NOT DETECTED IU/mL

## 2019-07-18 LAB — BASIC METABOLIC PANEL
BUN/Creatinine Ratio: 11 — ABNORMAL LOW (ref 12–28)
BUN: 7 mg/dL — ABNORMAL LOW (ref 8–27)
CO2: 25 mmol/L (ref 20–29)
Calcium: 9.7 mg/dL (ref 8.7–10.3)
Chloride: 104 mmol/L (ref 96–106)
Creatinine, Ser: 0.61 mg/dL (ref 0.57–1.00)
GFR calc Af Amer: 112 mL/min/{1.73_m2} (ref >59)
GFR calc non Af Amer: 97 mL/min/{1.73_m2} (ref >59)
Glucose: 96 mg/dL (ref 65–99)
Potassium: 4.1 mmol/L (ref 3.5–5.2)
Sodium: 142 mmol/L (ref 134–144)

## 2019-07-19 ENCOUNTER — Telehealth: Payer: Self-pay

## 2019-07-19 NOTE — Telephone Encounter (Signed)
Patient was called and a voicemail was left informing patient to return phone call for lab results.  A letter has been mailed out to patient to contact office. 

## 2019-07-19 NOTE — Telephone Encounter (Signed)
-----   Message from Hoy Register, MD sent at 07/19/2019 12:39 PM EDT ----- Please inform the patient that labs are normal. Thank you.

## 2019-08-21 ENCOUNTER — Encounter: Payer: Self-pay | Admitting: Family Medicine

## 2019-08-21 ENCOUNTER — Ambulatory Visit: Payer: 59 | Attending: Family Medicine | Admitting: Family Medicine

## 2019-08-21 ENCOUNTER — Other Ambulatory Visit: Payer: Self-pay

## 2019-08-21 DIAGNOSIS — I10 Essential (primary) hypertension: Secondary | ICD-10-CM | POA: Diagnosis not present

## 2019-08-21 MED ORDER — LISINOPRIL 20 MG PO TABS: 20.0000 mg | ORAL_TABLET | Freq: Every day | ORAL | 3 refills | Status: DC

## 2019-08-21 MED FILL — LISINOPRIL 20 MG TABLET: 20 | 30 days supply | Qty: 30 | Fill #0

## 2019-08-21 NOTE — Patient Instructions (Signed)
DASH Eating Plan DASH stands for "Dietary Approaches to Stop Hypertension." The DASH eating plan is a healthy eating plan that has been shown to reduce high blood pressure (hypertension). It may also reduce your risk for type 2 diabetes, heart disease, and stroke. The DASH eating plan may also help with weight loss. What are tips for following this plan?  General guidelines  Avoid eating more than 2,300 mg (milligrams) of salt (sodium) a day. If you have hypertension, you may need to reduce your sodium intake to 1,500 mg a day.  Limit alcohol intake to no more than 1 drink a day for nonpregnant women and 2 drinks a day for men. One drink equals 12 oz of beer, 5 oz of wine, or 1 oz of hard liquor.  Work with your health care provider to maintain a healthy body weight or to lose weight. Ask what an ideal weight is for you.  Get at least 30 minutes of exercise that causes your heart to beat faster (aerobic exercise) most days of the week. Activities may include walking, swimming, or biking.  Work with your health care provider or diet and nutrition specialist (dietitian) to adjust your eating plan to your individual calorie needs. Reading food labels   Check food labels for the amount of sodium per serving. Choose foods with less than 5 percent of the Daily Value of sodium. Generally, foods with less than 300 mg of sodium per serving fit into this eating plan.  To find whole grains, look for the word "whole" as the first word in the ingredient list. Shopping  Buy products labeled as "low-sodium" or "no salt added."  Buy fresh foods. Avoid canned foods and premade or frozen meals. Cooking  Avoid adding salt when cooking. Use salt-free seasonings or herbs instead of table salt or sea salt. Check with your health care provider or pharmacist before using salt substitutes.  Do not fry foods. Cook foods using healthy methods such as baking, boiling, grilling, and broiling instead.  Cook with  heart-healthy oils, such as olive, canola, soybean, or sunflower oil. Meal planning  Eat a balanced diet that includes: ? 5 or more servings of fruits and vegetables each day. At each meal, try to fill half of your plate with fruits and vegetables. ? Up to 6-8 servings of whole grains each day. ? Less than 6 oz of lean meat, poultry, or fish each day. A 3-oz serving of meat is about the same size as a deck of cards. One egg equals 1 oz. ? 2 servings of low-fat dairy each day. ? A serving of nuts, seeds, or beans 5 times each week. ? Heart-healthy fats. Healthy fats called Omega-3 fatty acids are found in foods such as flaxseeds and coldwater fish, like sardines, salmon, and mackerel.  Limit how much you eat of the following: ? Canned or prepackaged foods. ? Food that is high in trans fat, such as fried foods. ? Food that is high in saturated fat, such as fatty meat. ? Sweets, desserts, sugary drinks, and other foods with added sugar. ? Full-fat dairy products.  Do not salt foods before eating.  Try to eat at least 2 vegetarian meals each week.  Eat more home-cooked food and less restaurant, buffet, and fast food.  When eating at a restaurant, ask that your food be prepared with less salt or no salt, if possible. What foods are recommended? The items listed may not be a complete list. Talk with your dietitian about   what dietary choices are best for you. Grains Whole-grain or whole-wheat bread. Whole-grain or whole-wheat pasta. Brown rice. Oatmeal. Quinoa. Bulgur. Whole-grain and low-sodium cereals. Pita bread. Low-fat, low-sodium crackers. Whole-wheat flour tortillas. Vegetables Fresh or frozen vegetables (raw, steamed, roasted, or grilled). Low-sodium or reduced-sodium tomato and vegetable juice. Low-sodium or reduced-sodium tomato sauce and tomato paste. Low-sodium or reduced-sodium canned vegetables. Fruits All fresh, dried, or frozen fruit. Canned fruit in natural juice (without  added sugar). Meat and other protein foods Skinless chicken or turkey. Ground chicken or turkey. Pork with fat trimmed off. Fish and seafood. Egg whites. Dried beans, peas, or lentils. Unsalted nuts, nut butters, and seeds. Unsalted canned beans. Lean cuts of beef with fat trimmed off. Low-sodium, lean deli meat. Dairy Low-fat (1%) or fat-free (skim) milk. Fat-free, low-fat, or reduced-fat cheeses. Nonfat, low-sodium ricotta or cottage cheese. Low-fat or nonfat yogurt. Low-fat, low-sodium cheese. Fats and oils Soft margarine without trans fats. Vegetable oil. Low-fat, reduced-fat, or light mayonnaise and salad dressings (reduced-sodium). Canola, safflower, olive, soybean, and sunflower oils. Avocado. Seasoning and other foods Herbs. Spices. Seasoning mixes without salt. Unsalted popcorn and pretzels. Fat-free sweets. What foods are not recommended? The items listed may not be a complete list. Talk with your dietitian about what dietary choices are best for you. Grains Baked goods made with fat, such as croissants, muffins, or some breads. Dry pasta or rice meal packs. Vegetables Creamed or fried vegetables. Vegetables in a cheese sauce. Regular canned vegetables (not low-sodium or reduced-sodium). Regular canned tomato sauce and paste (not low-sodium or reduced-sodium). Regular tomato and vegetable juice (not low-sodium or reduced-sodium). Pickles. Olives. Fruits Canned fruit in a light or heavy syrup. Fried fruit. Fruit in cream or butter sauce. Meat and other protein foods Fatty cuts of meat. Ribs. Fried meat. Bacon. Sausage. Bologna and other processed lunch meats. Salami. Fatback. Hotdogs. Bratwurst. Salted nuts and seeds. Canned beans with added salt. Canned or smoked fish. Whole eggs or egg yolks. Chicken or turkey with skin. Dairy Whole or 2% milk, cream, and half-and-half. Whole or full-fat cream cheese. Whole-fat or sweetened yogurt. Full-fat cheese. Nondairy creamers. Whipped toppings.  Processed cheese and cheese spreads. Fats and oils Butter. Stick margarine. Lard. Shortening. Ghee. Bacon fat. Tropical oils, such as coconut, palm kernel, or palm oil. Seasoning and other foods Salted popcorn and pretzels. Onion salt, garlic salt, seasoned salt, table salt, and sea salt. Worcestershire sauce. Tartar sauce. Barbecue sauce. Teriyaki sauce. Soy sauce, including reduced-sodium. Steak sauce. Canned and packaged gravies. Fish sauce. Oyster sauce. Cocktail sauce. Horseradish that you find on the shelf. Ketchup. Mustard. Meat flavorings and tenderizers. Bouillon cubes. Hot sauce and Tabasco sauce. Premade or packaged marinades. Premade or packaged taco seasonings. Relishes. Regular salad dressings. Where to find more information:  National Heart, Lung, and Blood Institute: www.nhlbi.nih.gov  American Heart Association: www.heart.org Summary  The DASH eating plan is a healthy eating plan that has been shown to reduce high blood pressure (hypertension). It may also reduce your risk for type 2 diabetes, heart disease, and stroke.  With the DASH eating plan, you should limit salt (sodium) intake to 2,300 mg a day. If you have hypertension, you may need to reduce your sodium intake to 1,500 mg a day.  When on the DASH eating plan, aim to eat more fresh fruits and vegetables, whole grains, lean proteins, low-fat dairy, and heart-healthy fats.  Work with your health care provider or diet and nutrition specialist (dietitian) to adjust your eating plan to your   individual calorie needs. This information is not intended to replace advice given to you by your health care provider. Make sure you discuss any questions you have with your health care provider. Document Revised: 01/01/2017 Document Reviewed: 01/13/2016 Elsevier Patient Education  2020 Elsevier Inc.  

## 2019-08-21 NOTE — Progress Notes (Signed)
Subjective:  Patient ID: Raven Buckley, female    DOB: [birth year]  Age: 64 y.o. MRN: [id number]  CC: Hypertension   HPI DASHANAE LONGFIELD  is a 64 year old female with a history of hypertension, tobacco abusehere today for follow-up visit. Her blood pressure has improved compared to her last office visit at which time amlodipine has been added to her regimen.  She endorses compliance with her medications. She denies acute concerns today and denies presence of chest pain, dyspnea.  Past Medical History:  Diagnosis Date  . Hypertension     History reviewed. No pertinent surgical history.  History reviewed. No pertinent family history.  Allergies  Allergen Reactions  . Penicillins Other (See Comments)    pt stated: "I don't know it just bothers me but I did not have a rash or swelling"      Outpatient Medications Prior to Visit  Medication Sig Dispense Refill  . amLODipine (NORVASC) 10 MG tablet Take 1 tablet (10 mg total) by mouth daily. 30 tablet 3  . lisinopril (ZESTRIL) 10 MG tablet Take 1 tablet (10 mg total) by mouth daily. 30 tablet 3  . atorvastatin (LIPITOR) 40 MG tablet Take 1 tablet (40 mg total) by mouth daily. 30 tablet 3  . buPROPion (WELLBUTRIN SR) 150 MG 12 hr tablet Take 1 tablet (150 mg total) by mouth 2 (two) times daily. For smoking cessation (Patient not taking: Reported on 05/22/2019) 60 tablet 3  . carvedilol (COREG) 12.5 MG tablet Take 1 tablet (12.5 mg total) by mouth 2 (two) times daily with a meal. 60 tablet 3  . nicotine (NICODERM CQ - DOSED IN MG/24 HOURS) 14 mg/24hr patch Place 1 patch (14 mg total) onto the skin daily. (Patient not taking: Reported on 05/22/2019) 28 patch 0   No facility-administered medications prior to visit.     ROS Review of Systems  Constitutional: Negative for activity change, appetite change and fatigue.  HENT: Negative for congestion, sinus pressure and sore throat.   Eyes: Negative for visual disturbance.  Respiratory:  Negative for cough, chest tightness, shortness of breath and wheezing.   Cardiovascular: Negative for chest pain and palpitations.  Gastrointestinal: Negative for abdominal distention, abdominal pain and constipation.  Endocrine: Negative for polydipsia.  Genitourinary: Negative for dysuria and frequency.  Musculoskeletal: Negative for arthralgias and back pain.  Skin: Negative for rash.  Neurological: Negative for tremors, light-headedness and numbness.  Hematological: Does not bruise/bleed easily.  Psychiatric/Behavioral: Negative for agitation and behavioral problems.    Objective:  BP (!) 157/68   Pulse 67   Ht 4\' 11"  (1.499 m)   Wt 113 lb 3.2 oz (51.3 kg)   SpO2 100%   BMI 22.86 kg/m   BP/Weight 08/21/2019 07/17/2019 06/05/2019  Systolic BP 157 190 176  Diastolic BP 68 110 89  Wt. (Lbs) 113.2 109.4 -  BMI 22.86 22.1 -      Physical Exam Constitutional:      Appearance: She is well-developed.  Neck:     Vascular: No JVD.  Cardiovascular:     Rate and Rhythm: Normal rate.     Heart sounds: Normal heart sounds. No murmur heard.   Pulmonary:     Effort: Pulmonary effort is normal.     Breath sounds: Normal breath sounds. No wheezing or rales.  Chest:     Chest wall: No tenderness.  Abdominal:     General: Bowel sounds are normal. There is no distension.     Palpations: Abdomen  is soft. There is no mass.     Tenderness: There is no abdominal tenderness.  Musculoskeletal:        General: Normal range of motion.     Right lower leg: No edema.     Left lower leg: No edema.  Neurological:     Mental Status: She is alert and oriented to person, place, and time.  Psychiatric:        Mood and Affect: Mood normal.     CMP Latest Ref Rng & Units 07/17/2019 03/19/2019 03/18/2019  Glucose 65 - 99 mg/dL 96 294(T) -  BUN 8 - 27 mg/dL 7(L) 13 -  Creatinine 6.54 - 1.00 mg/dL 6.50 3.54 6.56  Sodium 134 - 144 mmol/L 142 140 -  Potassium 3.5 - 5.2 mmol/L 4.1 3.9 -  Chloride 96  - 106 mmol/L 104 107 -  CO2 20 - 29 mmol/L 25 22 -  Calcium 8.7 - 10.3 mg/dL 9.7 9.0 -  Total Protein 6.5 - 8.1 g/dL - 6.9 -  Total Bilirubin 0.3 - 1.2 mg/dL - 0.9 -  Alkaline Phos 38 - 126 U/L - 77 -  AST 15 - 41 U/L - 14(L) -  ALT 0 - 44 U/L - 12 -    Lipid Panel     Component Value Date/Time   CHOL 200 03/18/2019 1857   TRIG 60 03/18/2019 1857   HDL 47 03/18/2019 1857   CHOLHDL 4.3 03/18/2019 1857   VLDL 12 03/18/2019 1857   LDLCALC 141 (H) 03/18/2019 1857    CBC    Component Value Date/Time   WBC 5.4 03/19/2019 0503   RBC 4.17 03/19/2019 0503   HGB 12.4 03/19/2019 0503   HCT 39.2 03/19/2019 0503   PLT 340 03/19/2019 0503   MCV 94.0 03/19/2019 0503   MCH 29.7 03/19/2019 0503   MCHC 31.6 03/19/2019 0503   RDW 13.4 03/19/2019 0503   LYMPHSABS 2.1 01/07/2016 1716   MONOABS 0.5 01/07/2016 1716   EOSABS 0.1 01/07/2016 1716   BASOSABS 0.0 01/07/2016 1716    Lab Results  Component Value Date   HGBA1C 6.0 (H) 03/18/2019    Assessment & Plan:  1. Essential hypertension Increased dose of lisinopril We will check renal function at next visit Continue amlodipine, Coreg Counseled on blood pressure goal of less than 130/80, low-sodium, DASH diet, medication compliance, 150 minutes of moderate intensity exercise per week. Discussed medication compliance, adverse effects. - lisinopril (ZESTRIL) 20 MG tablet; Take 1 tablet (20 mg total) by mouth daily.  Dispense: 30 tablet; Refill: 3   Health Care Maintenance: At next office visit. Meds ordered this encounter  Medications  . lisinopril (ZESTRIL) 20 MG tablet    Sig: Take 1 tablet (20 mg total) by mouth daily.    Dispense:  30 tablet    Refill:  3    Dose increase    Follow-up: Return in about 6 weeks (around 10/02/2019) for complete physical exam.       Hoy Register, MD, FAAFP. [facility] [place], [place] [phone]   08/21/2019, 12:34 PM

## 2019-09-28 ENCOUNTER — Encounter: Payer: Self-pay | Admitting: Family Medicine

## 2019-09-28 ENCOUNTER — Ambulatory Visit: Payer: 59 | Attending: Family Medicine | Admitting: Family Medicine

## 2019-09-28 ENCOUNTER — Other Ambulatory Visit: Payer: Self-pay

## 2019-09-28 VITALS — BP 175/80 | HR 82 | Ht 59.0 in | Wt 113.6 lb

## 2019-09-28 DIAGNOSIS — Z1231 Encounter for screening mammogram for malignant neoplasm of breast: Secondary | ICD-10-CM

## 2019-09-28 DIAGNOSIS — I10 Essential (primary) hypertension: Secondary | ICD-10-CM | POA: Diagnosis not present

## 2019-09-28 DIAGNOSIS — Z1211 Encounter for screening for malignant neoplasm of colon: Secondary | ICD-10-CM

## 2019-09-28 DIAGNOSIS — Z1159 Encounter for screening for other viral diseases: Secondary | ICD-10-CM

## 2019-09-28 DIAGNOSIS — L603 Nail dystrophy: Secondary | ICD-10-CM

## 2019-09-28 DIAGNOSIS — Z0000 Encounter for general adult medical examination without abnormal findings: Secondary | ICD-10-CM

## 2019-09-28 MED ORDER — LISINOPRIL-HYDROCHLOROTHIAZIDE 20-25 MG PO TABS: 1.0000 | ORAL_TABLET | Freq: Every day | ORAL | 1 refills | Status: DC

## 2019-09-28 MED ORDER — CARVEDILOL 12.5 MG PO TABS: 12.5000 mg | ORAL_TABLET | Freq: Two times a day (BID) | ORAL | 1 refills | Status: DC

## 2019-09-28 MED FILL — CARVEDILOL 12.5 MG TABLET: 12.5 | 30 days supply | Qty: 60 | Fill #0

## 2019-09-28 MED FILL — LISINOPRIL-HYDROCHLOROTHIAZ: 20-25 | 30 days supply | Qty: 30 | Fill #0

## 2019-09-28 NOTE — Progress Notes (Signed)
Subjective:  Patient ID: Raven Buckley, female    DOB: 26-May-1955  Age: 64 y.o. MRN: [id number]  CC: Annual Exam and Gynecologic Exam   HPI Raven Buckley presents for a complete physical exam. She is due for mammogram and colonoscopy.  Status post hysterectomy in the 70s hence Pap smear not indicated. Her blood pressure is elevated and she attributes this to taking only one of her antihypertensive as she ran out of the rest.  Past Medical History:  Diagnosis Date  . Hypertension     History reviewed. No pertinent surgical history.  History reviewed. No pertinent family history.  Allergies  Allergen Reactions  . Penicillins Other (See Comments)    pt stated: "I don't know it just bothers me but I did not have a rash or swelling"      Outpatient Medications Prior to Visit  Medication Sig Dispense Refill  . amLODipine (NORVASC) 10 MG tablet Take 1 tablet (10 mg total) by mouth daily. 30 tablet 3  . lisinopril (ZESTRIL) 20 MG tablet Take 1 tablet (20 mg total) by mouth daily. 30 tablet 3  . atorvastatin (LIPITOR) 40 MG tablet Take 1 tablet (40 mg total) by mouth daily. 30 tablet 3  . buPROPion (WELLBUTRIN SR) 150 MG 12 hr tablet Take 1 tablet (150 mg total) by mouth 2 (two) times daily. For smoking cessation (Patient not taking: Reported on 05/22/2019) 60 tablet 3  . nicotine (NICODERM CQ - DOSED IN MG/24 HOURS) 14 mg/24hr patch Place 1 patch (14 mg total) onto the skin daily. (Patient not taking: Reported on 05/22/2019) 28 patch 0  . carvedilol (COREG) 12.5 MG tablet Take 1 tablet (12.5 mg total) by mouth 2 (two) times daily with a meal. 60 tablet 3   No facility-administered medications prior to visit.     ROS Review of Systems  Constitutional: Negative for activity change, appetite change and fatigue.  HENT: Negative for congestion, sinus pressure and sore throat.   Eyes: Negative for visual disturbance.  Respiratory: Negative for cough, chest tightness, shortness of  breath and wheezing.   Cardiovascular: Negative for chest pain and palpitations.  Gastrointestinal: Negative for abdominal distention, abdominal pain and constipation.  Endocrine: Negative for polydipsia.  Genitourinary: Negative for dysuria and frequency.  Musculoskeletal: Negative for arthralgias and back pain.  Skin: Negative for rash.  Neurological: Negative for tremors, light-headedness and numbness.  Hematological: Does not bruise/bleed easily.  Psychiatric/Behavioral: Negative for agitation and behavioral problems.    Objective:  BP (!) 175/80   Pulse 82   Ht 4\' 11"  (1.499 m)   Wt 113 lb 9.6 oz (51.5 kg)   SpO2 100%   BMI 22.94 kg/m   BP/Weight 09/28/2019 08/21/2019 07/17/2019  Systolic BP 175 157 190  Diastolic BP 80 68 110  Wt. (Lbs) 113.6 113.2 109.4  BMI 22.94 22.86 22.1      Physical Exam Constitutional:      General: She is not in acute distress.    Appearance: She is well-developed. She is not diaphoretic.  HENT:     Head: Normocephalic.     Right Ear: External ear normal.     Left Ear: External ear normal.     Nose: Nose normal.  Eyes:     Conjunctiva/sclera: Conjunctivae normal.     Pupils: Pupils are equal, round, and reactive to light.  Neck:     Vascular: No JVD.  Cardiovascular:     Rate and Rhythm: Normal rate and regular rhythm.  Heart sounds: Normal heart sounds. No murmur heard.  No gallop.   Pulmonary:     Effort: Pulmonary effort is normal. No respiratory distress.     Breath sounds: Normal breath sounds. No wheezing or rales.  Chest:     Chest wall: No tenderness.     Breasts:        Right: No mass or tenderness.        Left: No mass or tenderness.  Abdominal:     General: Bowel sounds are normal. There is no distension.     Palpations: Abdomen is soft. There is no mass.     Tenderness: There is no abdominal tenderness.  Musculoskeletal:        General: No tenderness. Normal range of motion.     Cervical back: Normal range of  motion.  Skin:    General: Skin is warm and dry.  Neurological:     Mental Status: She is alert and oriented to person, place, and time.     Deep Tendon Reflexes: Reflexes are normal and symmetric.     CMP Latest Ref Rng & Units 07/17/2019 03/19/2019 03/18/2019  Glucose 65 - 99 mg/dL 96 712(W) -  BUN 8 - 27 mg/dL 7(L) 13 -  Creatinine 5.80 - 1.00 mg/dL 9.98 3.38 2.50  Sodium 134 - 144 mmol/L 142 140 -  Potassium 3.5 - 5.2 mmol/L 4.1 3.9 -  Chloride 96 - 106 mmol/L 104 107 -  CO2 20 - 29 mmol/L 25 22 -  Calcium 8.7 - 10.3 mg/dL 9.7 9.0 -  Total Protein 6.5 - 8.1 g/dL - 6.9 -  Total Bilirubin 0.3 - 1.2 mg/dL - 0.9 -  Alkaline Phos 38 - 126 U/L - 77 -  AST 15 - 41 U/L - 14(L) -  ALT 0 - 44 U/L - 12 -    Lipid Panel     Component Value Date/Time   CHOL 200 03/18/2019 1857   TRIG 60 03/18/2019 1857   HDL 47 03/18/2019 1857   CHOLHDL 4.3 03/18/2019 1857   VLDL 12 03/18/2019 1857   LDLCALC 141 (H) 03/18/2019 1857    CBC    Component Value Date/Time   WBC 5.4 03/19/2019 0503   RBC 4.17 03/19/2019 0503   HGB 12.4 03/19/2019 0503   HCT 39.2 03/19/2019 0503   PLT 340 03/19/2019 0503   MCV 94.0 03/19/2019 0503   MCH 29.7 03/19/2019 0503   MCHC 31.6 03/19/2019 0503   RDW 13.4 03/19/2019 0503   LYMPHSABS 2.1 01/07/2016 1716   MONOABS 0.5 01/07/2016 1716   EOSABS 0.1 01/07/2016 1716   BASOSABS 0.0 01/07/2016 1716    Lab Results  Component Value Date   HGBA1C 6.0 (H) 03/18/2019    Assessment & Plan:  1. Annual physical exam Counseled on 150 minutes of exercise per week, healthy eating (including decreased daily intake of saturated fats, cholesterol, added sugars, sodium), routine healthcare maintenance.   2. Colon cancer screening - Cologuard - Ambulatory referral to Gastroenterology  3. Encounter for screening mammogram for malignant neoplasm of breast - MM DIGITAL SCREENING BILATERAL; Future  4. Accelerated hypertension - Basic Metabolic Panel - carvedilol  (COREG) 12.5 MG tablet; Take 1 tablet (12.5 mg total) by mouth 2 (two) times daily with a meal.  Dispense: 180 tablet; Refill: 1  5. Dystrophic nail - Ambulatory referral to Podiatry  6. Need for hepatitis C screening test - HCV RNA quant rflx ultra or genotyp([facility])   Meds ordered this encounter  Medications  . carvedilol (COREG) 12.5 MG tablet    Sig: Take 1 tablet (12.5 mg total) by mouth 2 (two) times daily with a meal.    Dispense:  180 tablet    Refill:  1  . lisinopril-hydrochlorothiazide (ZESTORETIC) 20-25 MG tablet    Sig: Take 1 tablet by mouth daily.    Dispense:  90 tablet    Refill:  1    Discontinue lisinopril    Follow-up: Return in about 3 months (around 12/29/2019) for Chronic disease management.       Hoy Register, MD, FAAFP. [facility] [place], [place] [phone]   09/28/2019, 1:49 PM

## 2019-09-28 NOTE — Patient Instructions (Signed)
Health Maintenance, Female Adopting a healthy lifestyle and getting preventive care are important in promoting health and wellness. Ask your health care provider about:  The right schedule for you to have regular tests and exams.  Things you can do on your own to prevent diseases and keep yourself healthy. What should I know about diet, weight, and exercise? Eat a healthy diet   Eat a diet that includes plenty of vegetables, fruits, low-fat dairy products, and lean protein.  Do not eat a lot of foods that are high in solid fats, added sugars, or sodium. Maintain a healthy weight Body mass index (BMI) is used to identify weight problems. It estimates body fat based on height and weight. Your health care provider can help determine your BMI and help you achieve or maintain a healthy weight. Get regular exercise Get regular exercise. This is one of the most important things you can do for your health. Most adults should:  Exercise for at least 150 minutes each week. The exercise should increase your heart rate and make you sweat (moderate-intensity exercise).  Do strengthening exercises at least twice a week. This is in addition to the moderate-intensity exercise.  Spend less time sitting. Even light physical activity can be beneficial. Watch cholesterol and blood lipids Have your blood tested for lipids and cholesterol at 64 years of age, then have this test every 5 years. Have your cholesterol levels checked more often if:  Your lipid or cholesterol levels are high.  You are older than 64 years of age.  You are at high risk for heart disease. What should I know about cancer screening? Depending on your health history and family history, you may need to have cancer screening at various ages. This may include screening for:  Breast cancer.  Cervical cancer.  Colorectal cancer.  Skin cancer.  Lung cancer. What should I know about heart disease, diabetes, and high blood  pressure? Blood pressure and heart disease  High blood pressure causes heart disease and increases the risk of stroke. This is more likely to develop in people who have high blood pressure readings, are of African descent, or are overweight.  Have your blood pressure checked: ? Every 3-5 years if you are 18-39 years of age. ? Every year if you are 40 years old or older. Diabetes Have regular diabetes screenings. This checks your fasting blood sugar level. Have the screening done:  Once every three years after age 40 if you are at a normal weight and have a low risk for diabetes.  More often and at a younger age if you are overweight or have a high risk for diabetes. What should I know about preventing infection? Hepatitis B If you have a higher risk for hepatitis B, you should be screened for this virus. Talk with your health care provider to find out if you are at risk for hepatitis B infection. Hepatitis C Testing is recommended for:  Everyone born from 1945 through 1965.  Anyone with known risk factors for hepatitis C. Sexually transmitted infections (STIs)  Get screened for STIs, including gonorrhea and chlamydia, if: ? You are sexually active and are younger than 64 years of age. ? You are older than 64 years of age and your health care provider tells you that you are at risk for this type of infection. ? Your sexual activity has changed since you were last screened, and you are at increased risk for chlamydia or gonorrhea. Ask your health care provider if   you are at risk.  Ask your health care provider about whether you are at high risk for HIV. Your health care provider may recommend a prescription medicine to help prevent HIV infection. If you choose to take medicine to prevent HIV, you should first get tested for HIV. You should then be tested every 3 months for as long as you are taking the medicine. Pregnancy  If you are about to stop having your period (premenopausal) and  you may become pregnant, seek counseling before you get pregnant.  Take 400 to 800 micrograms (mcg) of folic acid every day if you become pregnant.  Ask for birth control (contraception) if you want to prevent pregnancy. Osteoporosis and menopause Osteoporosis is a disease in which the bones lose minerals and strength with aging. This can result in bone fractures. If you are 65 years old or older, or if you are at risk for osteoporosis and fractures, ask your health care provider if you should:  Be screened for bone loss.  Take a calcium or vitamin D supplement to lower your risk of fractures.  Be given hormone replacement therapy (HRT) to treat symptoms of menopause. Follow these instructions at home: Lifestyle  Do not use any products that contain nicotine or tobacco, such as cigarettes, e-cigarettes, and chewing tobacco. If you need help quitting, ask your health care provider.  Do not use street drugs.  Do not share needles.  Ask your health care provider for help if you need support or information about quitting drugs. Alcohol use  Do not drink alcohol if: ? Your health care provider tells you not to drink. ? You are pregnant, may be pregnant, or are planning to become pregnant.  If you drink alcohol: ? Limit how much you use to 0-1 drink a day. ? Limit intake if you are breastfeeding.  Be aware of how much alcohol is in your drink. In the [place] one drink equals one 12 oz bottle of beer (355 mL), one 5 oz glass of wine (148 mL), or one 1 oz glass of hard liquor (44 mL). General instructions  Schedule regular health, dental, and eye exams.  Stay current with your vaccines.  Tell your health care provider if: ? You often feel depressed. ? You have ever been abused or do not feel safe at home. Summary  Adopting a healthy lifestyle and getting preventive care are important in promoting health and wellness.  Follow your health care provider's instructions about healthy  diet, exercising, and getting tested or screened for diseases.  Follow your health care provider's instructions on monitoring your cholesterol and blood pressure. This information is not intended to replace advice given to you by your health care provider. Make sure you discuss any questions you have with your health care provider. Document Revised: 01/12/2018 Document Reviewed: 01/12/2018 Elsevier Patient Education  2020 [employer]  

## 2019-09-30 LAB — HCV RNA QUANT RFLX ULTRA OR GENOTYP: HCV Quant Baseline: NOT DETECTED IU/mL

## 2019-09-30 LAB — BASIC METABOLIC PANEL
BUN/Creatinine Ratio: 11 — ABNORMAL LOW (ref 12–28)
BUN: 7 mg/dL — ABNORMAL LOW (ref 8–27)
CO2: 25 mmol/L (ref 20–29)
Calcium: 9.5 mg/dL (ref 8.7–10.3)
Chloride: 105 mmol/L (ref 96–106)
Creatinine, Ser: 0.65 mg/dL (ref 0.57–1.00)
GFR calc Af Amer: 109 mL/min/{1.73_m2} (ref >59)
GFR calc non Af Amer: 95 mL/min/{1.73_m2} (ref >59)
Glucose: 96 mg/dL (ref 65–99)
Potassium: 3.6 mmol/L (ref 3.5–5.2)
Sodium: 143 mmol/L (ref 134–144)

## 2019-10-02 ENCOUNTER — Telehealth: Payer: Self-pay

## 2019-10-02 NOTE — Telephone Encounter (Signed)
Patient was called and informed of normal results via voicemail. 

## 2019-10-02 NOTE — Telephone Encounter (Signed)
-----   Message from Hoy Register, MD sent at 10/02/2019  9:43 AM EDT ----- Please inform the patient that labs are normal. Thank you.

## 2019-11-09 ENCOUNTER — Other Ambulatory Visit: Payer: Self-pay

## 2019-11-09 ENCOUNTER — Ambulatory Visit
Admission: RE | Admit: 2019-11-09 | Discharge: 2019-11-09 | Disposition: A | Discharge status: Home / Self Care | Payer: 59 | Source: Ambulatory Visit | Attending: Family Medicine | Admitting: Family Medicine

## 2019-11-09 DIAGNOSIS — Z1231 Encounter for screening mammogram for malignant neoplasm of breast: Secondary | ICD-10-CM

## 2019-11-17 ENCOUNTER — Telehealth: Payer: Self-pay

## 2019-11-17 NOTE — Telephone Encounter (Signed)
Patient name and DOB has been verified Patient was informed of lab results. Patient had no questions.  

## 2019-11-17 NOTE — Telephone Encounter (Signed)
-----   Message from Hoy Register, MD sent at 11/16/2019  5:58 PM EDT ----- Mammogram is negative for malignancy

## 2020-01-01 ENCOUNTER — Ambulatory Visit: Payer: 59 | Admitting: Family Medicine

## 2020-01-01 ENCOUNTER — Telehealth: Payer: Self-pay | Admitting: Family Medicine

## 2020-01-01 NOTE — Telephone Encounter (Signed)
Patient came in for today's appointment. Rescheduled patient's appointment for 01/15/2020. Patient wondered if she could get refills of lisinopril and carvedilol until that appointment because she is currently out. Please follow-up.

## 2020-01-02 NOTE — Telephone Encounter (Signed)
Pt is requesting medication refill until her 01/15/2020 appt.

## 2020-01-05 ENCOUNTER — Ambulatory Visit: Payer: 59 | Admitting: Podiatry

## 2020-01-05 ENCOUNTER — Encounter: Payer: Self-pay | Admitting: Podiatry

## 2020-01-05 ENCOUNTER — Other Ambulatory Visit: Payer: Self-pay

## 2020-01-05 DIAGNOSIS — Q828 Other specified congenital malformations of skin: Secondary | ICD-10-CM

## 2020-01-05 DIAGNOSIS — M79675 Pain in left toe(s): Secondary | ICD-10-CM

## 2020-01-05 DIAGNOSIS — B351 Tinea unguium: Secondary | ICD-10-CM | POA: Diagnosis not present

## 2020-01-05 DIAGNOSIS — M79674 Pain in right toe(s): Secondary | ICD-10-CM

## 2020-01-05 NOTE — Progress Notes (Signed)
This patient presents  to the office for evaluation and treatment of long thick painful nails and callus left forefoot. .  This patient is unable to trim her own nails since the patient cannot reach her feet.  Patient says the nails are painful walking and wearing his shoes.  Her callus is also very painful since she stands and walks at work.   She returns for preventive foot care services.  General Appearance  Alert, conversant and in no acute stress.  Vascular  Dorsalis pedis and posterior tibial  pulses are palpable  bilaterally.  Capillary return is within normal limits  bilaterally. Temperature is within normal limits  bilaterally.  Neurologic  Senn-Weinstein monofilament wire test within normal limits  bilaterally. Muscle power within normal limits bilaterally.  Nails Thick disfigured discolored nails with subungual debris  from hallux to fifth toes bilaterally. No evidence of bacterial infection or drainage bilaterally.  Orthopedic  No limitations of motion  feet .  No crepitus or effusions noted.  No bony pathology or digital deformities noted.  Skin  normotropic skin with no porokeratosis noted bilaterally.  No signs of infections or ulcers noted.     Onychomycosis  Pain in toes right foot  Pain in toes left foot  Debridement  of nails  1-5  B/L with a nail nipper.  Nails were then filed using a dremel tool with no incidents.    RTC 3 months   Helane Gunther DPM

## 2020-01-15 ENCOUNTER — Encounter: Payer: Self-pay | Admitting: Family Medicine

## 2020-01-15 ENCOUNTER — Other Ambulatory Visit: Payer: Self-pay | Admitting: Family Medicine

## 2020-01-15 ENCOUNTER — Other Ambulatory Visit: Payer: Self-pay

## 2020-01-15 ENCOUNTER — Ambulatory Visit: Payer: 59 | Attending: Family Medicine | Admitting: Family Medicine

## 2020-01-15 VITALS — BP 213/105 | HR 88 | Ht 59.0 in | Wt 117.0 lb

## 2020-01-15 DIAGNOSIS — I10 Essential (primary) hypertension: Secondary | ICD-10-CM | POA: Diagnosis not present

## 2020-01-15 DIAGNOSIS — M7918 Myalgia, other site: Secondary | ICD-10-CM

## 2020-01-15 MED ORDER — TIZANIDINE HCL 4 MG PO TABS: 4.0000 mg | ORAL_TABLET | Freq: Three times a day (TID) | ORAL | 0 refills | Status: DC | PRN

## 2020-01-15 MED ORDER — CARVEDILOL 25 MG PO TABS: 25.0000 mg | ORAL_TABLET | Freq: Two times a day (BID) | ORAL | 1 refills | Status: DC

## 2020-01-15 MED ORDER — LISINOPRIL-HYDROCHLOROTHIAZIDE 20-25 MG PO TABS: 1.0000 | ORAL_TABLET | Freq: Every day | ORAL | 1 refills | Status: DC

## 2020-01-15 MED ORDER — CLONIDINE HCL 0.2 MG PO TABS
0.2000 mg | ORAL_TABLET | Freq: Once | ORAL | Status: AC
  Administered 2020-01-15: 0.2 mg via ORAL

## 2020-01-15 MED FILL — CARVEDILOL 25 MG TABLET: 25 | 30 days supply | Qty: 60 | Fill #0

## 2020-01-15 MED FILL — tiZANidine HCL 4 MG TABS: 4 | 20 days supply | Qty: 60 | Fill #0

## 2020-01-15 MED FILL — LISINOPRIL-HYDROCHLOROTHIAZ: 20-25 | 30 days supply | Qty: 30 | Fill #0

## 2020-01-15 NOTE — Progress Notes (Signed)
Had a fall last week and had pain in left side.

## 2020-01-15 NOTE — Progress Notes (Signed)
Subjective:  Patient ID: Raven Buckley, female    DOB: Feb 27, 1955  Age: 64 y.o. MRN: [id number]  CC: Hypertension   HPI Raven Buckley is a 64 year old female with a history of hypertension who presents today for follow-up visit.  She has been out of her antihypertensive hence her elevated blood pressure.  She denies presence of headache, chest pain, blurry vision. Last week she fell and hit her left side and complains of mild to moderate pain in her left rib cage. Past Medical History:  Diagnosis Date   Hypertension     No past surgical history on file.  No family history on file.  Allergies  Allergen Reactions   Penicillins Other (See Comments)    pt stated: "I don't know it just bothers me but I did not have a rash or swelling"      Outpatient Medications Prior to Visit  Medication Sig Dispense Refill   buPROPion (WELLBUTRIN SR) 150 MG 12 hr tablet Take 1 tablet (150 mg total) by mouth 2 (two) times daily. For smoking cessation 60 tablet 3   lisinopril-hydrochlorothiazide (ZESTORETIC) 20-25 MG tablet Take 1 tablet by mouth daily. 90 tablet 1   nicotine (NICODERM CQ - DOSED IN MG/24 HOURS) 14 mg/24hr patch Place 1 patch (14 mg total) onto the skin daily. (Patient not taking: Reported on 01/15/2020) 28 patch 0   carvedilol (COREG) 12.5 MG tablet Take 1 tablet (12.5 mg total) by mouth 2 (two) times daily with a meal. 180 tablet 1   No facility-administered medications prior to visit.     ROS Review of Systems  Constitutional: Negative for activity change, appetite change and fatigue.  HENT: Negative for congestion, sinus pressure and sore throat.   Eyes: Negative for visual disturbance.  Respiratory: Negative for cough, chest tightness, shortness of breath and wheezing.   Cardiovascular: Negative for chest pain and palpitations.  Gastrointestinal: Negative for abdominal distention, abdominal pain and constipation.  Endocrine: Negative for polydipsia.   Genitourinary: Negative for dysuria and frequency.  Musculoskeletal:       See HPI  Skin: Negative for rash.  Neurological: Negative for tremors, light-headedness and numbness.  Hematological: Does not bruise/bleed easily.  Psychiatric/Behavioral: Negative for agitation and behavioral problems.    Objective:  BP (!) 213/105    Pulse 88    Ht 4\' 11"  (1.499 m)    Wt 117 lb (53.1 kg)    SpO2 100%    BMI 23.63 kg/m   BP/Weight 01/15/2020 01/05/2020 09/28/2019  Systolic BP 213 183 175  Diastolic BP 105 104 80  Wt. (Lbs) 117 - 113.6  BMI 23.63 - 22.94      Physical Exam Constitutional:      Appearance: She is well-developed.  Neck:     Vascular: No JVD.  Cardiovascular:     Rate and Rhythm: Normal rate.     Heart sounds: Normal heart sounds. No murmur heard.   Pulmonary:     Effort: Pulmonary effort is normal.     Breath sounds: Normal breath sounds. No wheezing or rales.  Chest:     Chest wall: No tenderness.  Abdominal:     General: Bowel sounds are normal. There is no distension.     Palpations: Abdomen is soft. There is no mass.     Tenderness: There is no abdominal tenderness.  Musculoskeletal:     Right lower leg: No edema.     Left lower leg: No edema.     Comments:  Slight TTP of left rib cage  Neurological:     Mental Status: She is alert and oriented to person, place, and time.  Psychiatric:        Mood and Affect: Mood normal.     CMP Latest Ref Rng & Units 09/28/2019 07/17/2019 03/19/2019  Glucose 65 - 99 mg/dL 96 96 244(W)  BUN 8 - 27 mg/dL 7(L) 7(L) 13  Creatinine 0.57 - 1.00 mg/dL 1.02 7.25 3.66  Sodium 134 - 144 mmol/L 143 142 140  Potassium 3.5 - 5.2 mmol/L 3.6 4.1 3.9  Chloride 96 - 106 mmol/L 105 104 107  CO2 20 - 29 mmol/L 25 25 22   Calcium 8.7 - 10.3 mg/dL 9.5 9.7 9.0  Total Protein 6.5 - 8.1 g/dL - - 6.9  Total Bilirubin 0.3 - 1.2 mg/dL - - 0.9  Alkaline Phos 38 - 126 U/L - - 77  AST 15 - 41 U/L - - 14(L)  ALT 0 - 44 U/L - - 12    Lipid  Panel     Component Value Date/Time   CHOL 200 03/18/2019 1857   TRIG 60 03/18/2019 1857   HDL 47 03/18/2019 1857   CHOLHDL 4.3 03/18/2019 1857   VLDL 12 03/18/2019 1857   LDLCALC 141 (H) 03/18/2019 1857    CBC    Component Value Date/Time   WBC 5.4 03/19/2019 0503   RBC 4.17 03/19/2019 0503   HGB 12.4 03/19/2019 0503   HCT 39.2 03/19/2019 0503   PLT 340 03/19/2019 0503   MCV 94.0 03/19/2019 0503   MCH 29.7 03/19/2019 0503   MCHC 31.6 03/19/2019 0503   RDW 13.4 03/19/2019 0503   LYMPHSABS 2.1 01/07/2016 1716   MONOABS 0.5 01/07/2016 1716   EOSABS 0.1 01/07/2016 1716   BASOSABS 0.0 01/07/2016 1716    Lab Results  Component Value Date   HGBA1C 6.0 (H) 03/18/2019    Assessment & Plan:  1. Accelerated hypertension Accelerated hypertension with risk of endorgan damage and bodily harm Clonidine 0.2 mg administered and patient observed after 30 minutes and reassessed prior to discharge Refilled medications Increased Coreg dose - carvedilol (COREG) 25 MG tablet; Take 1 tablet (25 mg total) by mouth 2 (two) times daily with a meal.  Dispense: 180 tablet; Refill: 1 - lisinopril-hydrochlorothiazide (ZESTORETIC) 20-25 MG tablet; Take 1 tablet by mouth daily.  Dispense: 90 tablet; Refill: 1 - cloNIDine (CATAPRES) tablet 0.2 mg  2. Musculoskeletal pain Advised to apply heat. - tiZANidine (ZANAFLEX) 4 MG tablet; Take 1 tablet (4 mg total) by mouth every 8 (eight) hours as needed for muscle spasms.  Dispense: 60 tablet; Refill: 0   Meds ordered this encounter  Medications   tiZANidine (ZANAFLEX) 4 MG tablet    Sig: Take 1 tablet (4 mg total) by mouth every 8 (eight) hours as needed for muscle spasms.    Dispense:  60 tablet    Refill:  0   carvedilol (COREG) 25 MG tablet    Sig: Take 1 tablet (25 mg total) by mouth 2 (two) times daily with a meal.    Dispense:  180 tablet    Refill:  1    Dose increase   lisinopril-hydrochlorothiazide (ZESTORETIC) 20-25 MG tablet     Sig: Take 1 tablet by mouth daily.    Dispense:  90 tablet    Refill:  1   cloNIDine (CATAPRES) tablet 0.2 mg    Follow-up: Return in about 6 weeks (around 02/26/2020) for Hypertension.  Hoy Register, MD, FAAFP. [facility] [place], [place] [phone]   01/15/2020, 9:59 AM

## 2020-03-11 ENCOUNTER — Other Ambulatory Visit: Payer: Self-pay | Admitting: Family Medicine

## 2020-03-11 ENCOUNTER — Other Ambulatory Visit: Payer: Self-pay

## 2020-03-11 ENCOUNTER — Encounter: Payer: Self-pay | Admitting: Family Medicine

## 2020-03-11 ENCOUNTER — Ambulatory Visit: Payer: 59 | Attending: Family Medicine | Admitting: Family Medicine

## 2020-03-11 VITALS — BP 177/81 | HR 70 | Ht 59.0 in | Wt 114.6 lb

## 2020-03-11 DIAGNOSIS — I10 Essential (primary) hypertension: Secondary | ICD-10-CM | POA: Diagnosis not present

## 2020-03-11 MED ORDER — AMLODIPINE BESYLATE 10 MG PO TABS: 10.0000 mg | ORAL_TABLET | Freq: Every day | ORAL | 6 refills | Status: DC

## 2020-03-11 MED FILL — AMLODIPINE BESYLATE 10 MG T: 10 | 30 days supply | Qty: 30 | Fill #0

## 2020-03-11 NOTE — Progress Notes (Signed)
Subjective:  Patient ID: Raven Buckley, female    DOB: 1956-01-18  Age: 65 y.o. MRN: [id number]  CC: Hypertension   HPI Raven Buckley  is a 65 year old female with a history of hypertension who presents today for follow-up visit. She endorses compliance with lisinopril/HCTZ and carvedilol and her blood pressure is elevated today. She has no chest pains, dyspnea, blurry vision, headaches. She has not been compliant with an exercise regimen or low-sodium diet.  Past Medical History:  Diagnosis Date  . Hypertension     History reviewed. No pertinent surgical history.  History reviewed. No pertinent family history.  Allergies  Allergen Reactions  . Penicillins Other (See Comments)    pt stated: "I don't know it just bothers me but I did not have a rash or swelling"      Outpatient Medications Prior to Visit  Medication Sig Dispense Refill  . buPROPion (WELLBUTRIN SR) 150 MG 12 hr tablet Take 1 tablet (150 mg total) by mouth 2 (two) times daily. For smoking cessation 60 tablet 3  . lisinopril-hydrochlorothiazide (ZESTORETIC) 20-25 MG tablet Take 1 tablet by mouth daily. 90 tablet 1  . tiZANidine (ZANAFLEX) 4 MG tablet Take 1 tablet (4 mg total) by mouth every 8 (eight) hours as needed for muscle spasms. 60 tablet 0  . carvedilol (COREG) 25 MG tablet Take 1 tablet (25 mg total) by mouth 2 (two) times daily with a meal. 180 tablet 1  . nicotine (NICODERM CQ - DOSED IN MG/24 HOURS) 14 mg/24hr patch Place 1 patch (14 mg total) onto the skin daily. (Patient not taking: No sig reported) 28 patch 0   No facility-administered medications prior to visit.     ROS Review of Systems  Constitutional: Negative for activity change, appetite change and fatigue.  HENT: Negative for congestion, sinus pressure and sore throat.   Eyes: Negative for visual disturbance.  Respiratory: Negative for cough, chest tightness, shortness of breath and wheezing.   Cardiovascular: Negative for chest  pain and palpitations.  Gastrointestinal: Negative for abdominal distention, abdominal pain and constipation.  Endocrine: Negative for polydipsia.  Genitourinary: Negative for dysuria and frequency.  Musculoskeletal: Negative for arthralgias and back pain.  Skin: Negative for rash.  Neurological: Negative for tremors, light-headedness and numbness.  Hematological: Does not bruise/bleed easily.  Psychiatric/Behavioral: Negative for agitation and behavioral problems.    Objective:  BP (!) 177/81   Pulse 70   Ht 4\' 11"  (1.499 m)   Wt 114 lb 9.6 oz (52 kg)   SpO2 100%   BMI 23.15 kg/m   BP/Weight 03/11/2020 01/15/2020 01/05/2020  Systolic BP 177 213 183  Diastolic BP 81 105 104  Wt. (Lbs) 114.6 117 -  BMI 23.15 23.63 -      Physical Exam Constitutional:      Appearance: She is well-developed.  Neck:     Vascular: No JVD.  Cardiovascular:     Rate and Rhythm: Normal rate.     Heart sounds: Normal heart sounds. No murmur heard.   Pulmonary:     Effort: Pulmonary effort is normal.     Breath sounds: Normal breath sounds. No wheezing or rales.  Chest:     Chest wall: No tenderness.  Abdominal:     General: Bowel sounds are normal. There is no distension.     Palpations: Abdomen is soft. There is no mass.     Tenderness: There is no abdominal tenderness.  Musculoskeletal:  General: Normal range of motion.     Right lower leg: No edema.     Left lower leg: No edema.  Neurological:     Mental Status: She is alert and oriented to person, place, and time.  Psychiatric:        Mood and Affect: Mood normal.     CMP Latest Ref Rng & Units 09/28/2019 07/17/2019 03/19/2019  Glucose 65 - 99 mg/dL 96 96 664(Q)  BUN 8 - 27 mg/dL 7(L) 7(L) 13  Creatinine 0.57 - 1.00 mg/dL 0.34 7.42 5.95  Sodium 134 - 144 mmol/L 143 142 140  Potassium 3.5 - 5.2 mmol/L 3.6 4.1 3.9  Chloride 96 - 106 mmol/L 105 104 107  CO2 20 - 29 mmol/L 25 25 22   Calcium 8.7 - 10.3 mg/dL 9.5 9.7 9.0   Total Protein 6.5 - 8.1 g/dL - - 6.9  Total Bilirubin 0.3 - 1.2 mg/dL - - 0.9  Alkaline Phos 38 - 126 U/L - - 77  AST 15 - 41 U/L - - 14(L)  ALT 0 - 44 U/L - - 12    Lipid Panel     Component Value Date/Time   CHOL 200 03/18/2019 1857   TRIG 60 03/18/2019 1857   HDL 47 03/18/2019 1857   CHOLHDL 4.3 03/18/2019 1857   VLDL 12 03/18/2019 1857   LDLCALC 141 (H) 03/18/2019 1857    CBC    Component Value Date/Time   WBC 5.4 03/19/2019 0503   RBC 4.17 03/19/2019 0503   HGB 12.4 03/19/2019 0503   HCT 39.2 03/19/2019 0503   PLT 340 03/19/2019 0503   MCV 94.0 03/19/2019 0503   MCH 29.7 03/19/2019 0503   MCHC 31.6 03/19/2019 0503   RDW 13.4 03/19/2019 0503   LYMPHSABS 2.1 01/07/2016 1716   MONOABS 0.5 01/07/2016 1716   EOSABS 0.1 01/07/2016 1716   BASOSABS 0.0 01/07/2016 1716    Lab Results  Component Value Date   HGBA1C 6.0 (H) 03/18/2019    Assessment & Plan:  1. Essential hypertension Uncontrolled Amlodipine added to regimen Continue Coreg and lisinopril/HCTZ Counseled on blood pressure goal of less than 130/80, low-sodium, DASH diet, medication compliance, 150 minutes of moderate intensity exercise per week. Discussed medication compliance, adverse effects. - amLODipine (NORVASC) 10 MG tablet; Take 1 tablet (10 mg total) by mouth daily.  Dispense: 30 tablet; Refill: 6 - Basic Metabolic Panel   Meds ordered this encounter  Medications  . amLODipine (NORVASC) 10 MG tablet    Sig: Take 1 tablet (10 mg total) by mouth daily.    Dispense:  30 tablet    Refill:  6    Follow-up: Return in about 1 month (around 04/08/2020) for Hypertension.       06/08/2020, MD, FAAFP. [facility] [place], [place]   03/11/2020, 10:03 AM

## 2020-03-11 NOTE — Patient Instructions (Signed)

## 2020-03-12 LAB — BASIC METABOLIC PANEL
BUN/Creatinine Ratio: 15 (ref 12–28)
BUN: 9 mg/dL (ref 8–27)
CO2: 21 mmol/L (ref 20–29)
Calcium: 9.5 mg/dL (ref 8.7–10.3)
Chloride: 104 mmol/L (ref 96–106)
Creatinine, Ser: 0.62 mg/dL (ref 0.57–1.00)
GFR calc Af Amer: 110 mL/min/{1.73_m2} (ref >59)
GFR calc non Af Amer: 96 mL/min/{1.73_m2} (ref >59)
Glucose: 116 mg/dL — ABNORMAL HIGH (ref 65–99)
Potassium: 4.1 mmol/L (ref 3.5–5.2)
Sodium: 143 mmol/L (ref 134–144)

## 2020-03-14 ENCOUNTER — Telehealth: Payer: Self-pay

## 2020-03-14 NOTE — Telephone Encounter (Signed)
-----   Message from Hoy Register, MD sent at 03/12/2020  9:48 AM EST ----- Please inform the patient that labs are normal. Thank you.

## 2020-03-14 NOTE — Telephone Encounter (Signed)
Patient name and DOB has been verified Patient was informed of lab results. Patient had no questions.  

## 2020-04-05 ENCOUNTER — Ambulatory Visit: Payer: 59 | Admitting: Podiatry

## 2020-04-15 ENCOUNTER — Ambulatory Visit: Payer: BC Managed Care – PPO | Attending: Family Medicine | Admitting: Family Medicine

## 2020-04-15 ENCOUNTER — Other Ambulatory Visit: Payer: Self-pay

## 2020-04-15 ENCOUNTER — Encounter: Payer: Self-pay | Admitting: Family Medicine

## 2020-04-15 DIAGNOSIS — Z9114 Patient's other noncompliance with medication regimen: Secondary | ICD-10-CM | POA: Diagnosis not present

## 2020-04-15 DIAGNOSIS — I10 Essential (primary) hypertension: Secondary | ICD-10-CM | POA: Diagnosis not present

## 2020-04-15 NOTE — Progress Notes (Signed)
Virtual Visit via Telephone Note  I connected with Raven Buckley, on 04/15/2020 at 10:45 AM by telephone due to the COVID-19 pandemic and verified that I am speaking with the correct person using two identifiers.   Consent: I discussed the limitations, risks, security and privacy concerns of performing an evaluation and management service by telephone and the availability of in person appointments. I also discussed with the patient that there may be a patient responsible charge related to this service. The patient expressed understanding and agreed to proceed.   Location of Patient: [facility] of Provider: Clinic   Persons participating in Telemedicine visit: Tenna Delaine Farrington-CMA Dr. Alvis Lemmings     History of Present Illness: 65 year old female with a history of hypertension who is seen for follow-up visit.  Her blood pressure has remained elevated and amlodipine was added at her last office visit which she was to take in addition to lisinopril/HCTZ and carvedilol.  On questioning about compliance she endorses she is taking 2 medications plus a 'muscle spasm medications'.  Review of her med list indicates she should be on a total of 5 medications.  I am concerned that she has not been fully compliant due to poor comprehension. She denies presence of chest pains, dyspnea, pedal edema  Past Medical History:  Diagnosis Date  . Hypertension    Allergies  Allergen Reactions  . Penicillins Other (See Comments)    pt stated: "I don't know it just bothers me but I did not have a rash or swelling"      Current Outpatient Medications on File Prior to Visit  Medication Sig Dispense Refill  . amLODipine (NORVASC) 10 MG tablet Take 1 tablet (10 mg total) by mouth daily. 30 tablet 6  . buPROPion (WELLBUTRIN SR) 150 MG 12 hr tablet Take 1 tablet (150 mg total) by mouth 2 (two) times daily. For smoking cessation 60 tablet 3  . carvedilol (COREG) 25 MG tablet  Take 1 tablet (25 mg total) by mouth 2 (two) times daily with a meal. 180 tablet 1  . lisinopril-hydrochlorothiazide (ZESTORETIC) 20-25 MG tablet Take 1 tablet by mouth daily. 90 tablet 1  . nicotine (NICODERM CQ - DOSED IN MG/24 HOURS) 14 mg/24hr patch Place 1 patch (14 mg total) onto the skin daily. (Patient not taking: No sig reported) 28 patch 0  . tiZANidine (ZANAFLEX) 4 MG tablet Take 1 tablet (4 mg total) by mouth every 8 (eight) hours as needed for muscle spasms. 60 tablet 0   No current facility-administered medications on file prior to visit.    ROS: See HPI  Observations/Objective: Awake, alert, oriented x3 Not in acute distress Normal mood  Assessment and Plan: 1. Accelerated hypertension Uncontrolled She does not have her medications with me and is unsure of all she should be taking She needs an office visit with the clinical pharmacist and has been advised to bring in all her bottles for review as anticipate noncompliance which is strongly responsible for her uncontrolled hypertension Counseled on blood pressure goal of less than 130/80, low-sodium, DASH diet, medication compliance, 150 minutes of moderate intensity exercise per week. Discussed medication compliance, adverse effects.   2. Non compliance w medication regimen Poor comprehension contributing to noncompliance    Follow Up Instructions: 1 week for clinical pharmacist for blood pressure evaluation   I discussed the assessment and treatment plan with the patient. The patient was provided an opportunity to ask questions and all were answered. The  patient agreed with the plan and demonstrated an understanding of the instructions.   The patient was advised to call back or seek an in-person evaluation if the symptoms worsen or if the condition fails to improve as anticipated.     I provided 11 minutes total of non-face-to-face time during this encounter.   Hoy Register, MD, FAAFP. [facility] and [facility] [place], [place] [phone]   04/15/2020, 10:45 AM

## 2020-04-30 ENCOUNTER — Ambulatory Visit: Payer: BC Managed Care – PPO | Admitting: Pharmacist

## 2020-05-13 ENCOUNTER — Other Ambulatory Visit: Payer: Self-pay | Admitting: Family Medicine

## 2020-05-13 ENCOUNTER — Other Ambulatory Visit: Payer: Self-pay

## 2020-05-13 DIAGNOSIS — I10 Essential (primary) hypertension: Secondary | ICD-10-CM

## 2020-05-13 MED FILL — Lisinopril & Hydrochlorothiazide Tab 20-25 MG: ORAL | 30 days supply | Qty: 30 | Fill #0 | Status: AC

## 2020-05-13 MED FILL — Carvedilol Tab 25 MG: ORAL | 30 days supply | Qty: 60 | Fill #0 | Status: AC

## 2020-05-13 NOTE — Telephone Encounter (Signed)
Copied from CRM [id number]. Topic: Quick Communication - Rx Refill/Question >> May 13, 2020  3:09 PM Jaquita Rector A wrote: Medication: carvedilol (COREG) 25 MG tablet, lisinopril-hydrochlorothiazide (ZESTORETIC) 20-25 MG tablet     Has the patient contacted their pharmacy? Yes.   (Agent: If no, request that the patient contact the pharmacy for the refill.) (Agent: If yes, when and what did the pharmacy advise?)  Preferred Pharmacy (with phone number or street name): [facility]  Phone:  [phone] Fax:  [phone]     Agent: Please be advised that RX refills may take up to 3 business days. We ask that you follow-up with your pharmacy.

## 2020-05-14 ENCOUNTER — Other Ambulatory Visit: Payer: Self-pay

## 2020-07-29 ENCOUNTER — Ambulatory Visit: Payer: BC Managed Care – PPO | Attending: Family Medicine | Admitting: Family Medicine

## 2020-07-29 ENCOUNTER — Other Ambulatory Visit: Payer: Self-pay

## 2020-07-29 ENCOUNTER — Encounter: Payer: Self-pay | Admitting: Family Medicine

## 2020-07-29 VITALS — BP 158/80 | HR 76 | Ht 59.0 in | Wt 113.2 lb

## 2020-07-29 DIAGNOSIS — I10 Essential (primary) hypertension: Secondary | ICD-10-CM | POA: Diagnosis not present

## 2020-07-29 DIAGNOSIS — Z1211 Encounter for screening for malignant neoplasm of colon: Secondary | ICD-10-CM

## 2020-07-29 DIAGNOSIS — R7303 Prediabetes: Secondary | ICD-10-CM | POA: Diagnosis not present

## 2020-07-29 DIAGNOSIS — Z1331 Encounter for screening for depression: Secondary | ICD-10-CM

## 2020-07-29 MED ORDER — LISINOPRIL-HYDROCHLOROTHIAZIDE 20-12.5 MG PO TABS
2.0000 | ORAL_TABLET | Freq: Every day | ORAL | 1 refills | Status: DC
  Filled 2020-07-29: qty 180, 90d supply, fill #0
  Filled 2021-05-15: qty 60, 30d supply, fill #0

## 2020-07-29 NOTE — Progress Notes (Signed)
Subjective:  Patient ID: Raven Buckley, female    DOB: 29-Dec-1955  Age: 65 y.o. MRN: [id number]  CC: Hypertension   HPI Raven Buckley is a 65 year old female with a history of hypertension who is seen for follow-up visit.  Interval History: She endorses compliance with all her antihypertensives and did take her medication this morning.  Her blood pressure is still elevated at 158/80.  She continues to smoke but denies smoking this morning. She has had some nasal congestion and states she is recovering from a cold.  Past Medical History:  Diagnosis Date   Hypertension     History reviewed. No pertinent surgical history.  History reviewed. No pertinent family history.  Allergies  Allergen Reactions   Penicillins Other (See Comments)     pt stated: "I don't know it just bothers me but I did not have a rash or swelling"      Outpatient Medications Prior to Visit  Medication Sig Dispense Refill   amLODipine (NORVASC) 10 MG tablet TAKE 1 TABLET (10 MG TOTAL) BY MOUTH DAILY. 30 tablet 6   buPROPion (WELLBUTRIN SR) 150 MG 12 hr tablet Take 1 tablet (150 mg total) by mouth 2 (two) times daily. For smoking cessation 60 tablet 3   carvedilol (COREG) 25 MG tablet TAKE 1 TABLET (25 MG TOTAL) BY MOUTH 2 (TWO) TIMES DAILY WITH A MEAL. 180 tablet 1   tiZANidine (ZANAFLEX) 4 MG tablet TAKE 1 TABLET (4 MG TOTAL) BY MOUTH EVERY 8 (EIGHT) HOURS AS NEEDED FOR MUSCLE SPASMS. 60 tablet 0   lisinopril-hydrochlorothiazide (ZESTORETIC) 20-25 MG tablet TAKE 1 TABLET BY MOUTH DAILY. 90 tablet 1   nicotine (NICODERM CQ - DOSED IN MG/24 HOURS) 14 mg/24hr patch Place 1 patch (14 mg total) onto the skin daily. (Patient not taking: No sig reported) 28 patch 0   No facility-administered medications prior to visit.     ROS Review of Systems  Constitutional:  Negative for activity change, appetite change and fatigue.  HENT:  Positive for congestion. Negative for sinus pressure and sore throat.   Eyes:   Negative for visual disturbance.  Respiratory:  Negative for cough, chest tightness, shortness of breath and wheezing.   Cardiovascular:  Negative for chest pain and palpitations.  Gastrointestinal:  Negative for abdominal distention, abdominal pain and constipation.  Endocrine: Negative for polydipsia.  Genitourinary:  Negative for dysuria and frequency.  Musculoskeletal:  Negative for arthralgias and back pain.  Skin:  Negative for rash.  Neurological:  Negative for tremors, light-headedness and numbness.  Hematological:  Does not bruise/bleed easily.  Psychiatric/Behavioral:  Negative for agitation and behavioral problems.   Objective:  BP (!) 158/80   Pulse 76   Ht 4\' 11"  (1.499 m)   Wt 113 lb 3.2 oz (51.3 kg)   SpO2 100%   BMI 22.86 kg/m   BP/Weight 07/29/2020 03/11/2020 01/15/2020  Systolic BP 158 177 213  Diastolic BP 80 81 105  Wt. (Lbs) 113.2 114.6 117  BMI 22.86 23.15 23.63      Physical Exam Constitutional:      Appearance: She is well-developed.  HENT:     Nose: No congestion.     Mouth/Throat:     Mouth: Mucous membranes are moist.  Neck:     Vascular: No JVD.  Cardiovascular:     Rate and Rhythm: Normal rate.     Heart sounds: Normal heart sounds. No murmur heard. Pulmonary:     Effort: Pulmonary effort is normal.  Breath sounds: Normal breath sounds. No wheezing or rales.  Chest:     Chest wall: No tenderness.  Abdominal:     General: Bowel sounds are normal. There is no distension.     Palpations: Abdomen is soft. There is no mass.     Tenderness: There is no abdominal tenderness.  Musculoskeletal:        General: Normal range of motion.     Right lower leg: No edema.     Left lower leg: No edema.  Neurological:     Mental Status: She is alert and oriented to person, place, and time.  Psychiatric:        Mood and Affect: Mood normal.    CMP Latest Ref Rng & Units 03/11/2020 09/28/2019 07/17/2019  Glucose 65 - 99 mg/dL 741(O) 96 96  BUN 8 - 27  mg/dL 9 7(L) 7(L)  Creatinine 0.57 - 1.00 mg/dL 8.78 6.76 7.20  Sodium 134 - 144 mmol/L 143 143 142  Potassium 3.5 - 5.2 mmol/L 4.1 3.6 4.1  Chloride 96 - 106 mmol/L 104 105 104  CO2 20 - 29 mmol/L 21 25 25   Calcium 8.7 - 10.3 mg/dL 9.5 9.5 9.7  Total Protein 6.5 - 8.1 g/dL - - -  Total Bilirubin 0.3 - 1.2 mg/dL - - -  Alkaline Phos 38 - 126 U/L - - -  AST 15 - 41 U/L - - -  ALT 0 - 44 U/L - - -    Lipid Panel     Component Value Date/Time   CHOL 200 03/18/2019 1857   TRIG 60 03/18/2019 1857   HDL 47 03/18/2019 1857   CHOLHDL 4.3 03/18/2019 1857   VLDL 12 03/18/2019 1857   LDLCALC 141 (H) 03/18/2019 1857    CBC    Component Value Date/Time   WBC 5.4 03/19/2019 0503   RBC 4.17 03/19/2019 0503   HGB 12.4 03/19/2019 0503   HCT 39.2 03/19/2019 0503   PLT 340 03/19/2019 0503   MCV 94.0 03/19/2019 0503   MCH 29.7 03/19/2019 0503   MCHC 31.6 03/19/2019 0503   RDW 13.4 03/19/2019 0503   LYMPHSABS 2.1 01/07/2016 1716   MONOABS 0.5 01/07/2016 1716   EOSABS 0.1 01/07/2016 1716   BASOSABS 0.0 01/07/2016 1716    Lab Results  Component Value Date   HGBA1C 6.0 (H) 03/18/2019    Assessment & Plan:  1. Essential hypertension Uncontrolled Switched from lisinopril/HCTZ 20/25 to 20/12.5 (2 tablets) daily - lisinopril-hydrochlorothiazide (ZESTORETIC) 20-12.5 MG tablet; Take 2 tablets by mouth daily.  Dispense: 180 tablet; Refill: 1 - Basic Metabolic Panel; Future  2. Screening for colon cancer Previously referred in 09/2019 Will refer again - Ambulatory referral to Gastroenterology  3. Prediabetes A1c of 6.0 Will check A1c at next visit Diabetic diet to prevent progression to diabetes   Health Care Maintenance: Due for Shingrix-she defers till next visit. Meds ordered this encounter  Medications   lisinopril-hydrochlorothiazide (ZESTORETIC) 20-12.5 MG tablet    Sig: Take 2 tablets by mouth daily.    Dispense:  180 tablet    Refill:  1    Discontinue HCTZ/lisinopril  20/25 mg    Follow-up: Return in about 3 months (around 10/29/2020) for Medical conditions.       10/31/2020, MD, FAAFP. [facility] [place], [place]   07/29/2020, 9:05 AM

## 2020-07-29 NOTE — Progress Notes (Signed)
Need refills

## 2020-07-29 NOTE — Patient Instructions (Signed)
https://www.nhlbi.nih.gov/files/docs/public/heart/dash_brief.pdf">  DASH Eating Plan DASH stands for Dietary Approaches to Stop Hypertension. The DASH eating plan is a healthy eating plan that has been shown to: Reduce high blood pressure (hypertension). Reduce your risk for type 2 diabetes, heart disease, and stroke. Help with weight loss. What are tips for following this plan? Reading food labels Check food labels for the amount of salt (sodium) per serving. Choose foods with less than 5 percent of the Daily Value of sodium. Generally, foods with less than 300 milligrams (mg) of sodium per serving fit into this eating plan. To find whole grains, look for the word "whole" as the first word in the ingredient list. Shopping Buy products labeled as "low-sodium" or "no salt added." Buy fresh foods. Avoid canned foods and pre-made or frozen meals. Cooking Avoid adding salt when cooking. Use salt-free seasonings or herbs instead of table salt or sea salt. Check with your health care provider or pharmacist before using salt substitutes. Do not fry foods. Cook foods using healthy methods such as baking, boiling, grilling, roasting, and broiling instead. Cook with heart-healthy oils, such as olive, canola, avocado, soybean, or sunflower oil. Meal planning  Eat a balanced diet that includes: 4 or more servings of fruits and 4 or more servings of vegetables each day. Try to fill one-half of your plate with fruits and vegetables. 6-8 servings of whole grains each day. Less than 6 oz (170 g) of lean meat, poultry, or fish each day. A 3-oz (85-g) serving of meat is about the same size as a deck of cards. One egg equals 1 oz (28 g). 2-3 servings of low-fat dairy each day. One serving is 1 cup (237 mL). 1 serving of nuts, seeds, or beans 5 times each week. 2-3 servings of heart-healthy fats. Healthy fats called omega-3 fatty acids are found in foods such as walnuts, flaxseeds, fortified milks, and eggs.  These fats are also found in cold-water fish, such as sardines, salmon, and mackerel. Limit how much you eat of: Canned or prepackaged foods. Food that is high in trans fat, such as some fried foods. Food that is high in saturated fat, such as fatty meat. Desserts and other sweets, sugary drinks, and other foods with added sugar. Full-fat dairy products. Do not salt foods before eating. Do not eat more than 4 egg yolks a week. Try to eat at least 2 vegetarian meals a week. Eat more home-cooked food and less restaurant, buffet, and fast food.  Lifestyle When eating at a restaurant, ask that your food be prepared with less salt or no salt, if possible. If you drink alcohol: Limit how much you use to: 0-1 drink a day for women who are not pregnant. 0-2 drinks a day for men. Be aware of how much alcohol is in your drink. In the [place] one drink equals one 12 oz bottle of beer (355 mL), one 5 oz glass of wine (148 mL), or one 1 oz glass of hard liquor (44 mL). General information Avoid eating more than 2,300 mg of salt a day. If you have hypertension, you may need to reduce your sodium intake to 1,500 mg a day. Work with your health care provider to maintain a healthy body weight or to lose weight. Ask what an ideal weight is for you. Get at least 30 minutes of exercise that causes your heart to beat faster (aerobic exercise) most days of the week. Activities may include walking, swimming, or biking. Work with your health care provider   or dietitian to adjust your eating plan to your individual calorie needs. What foods should I eat? Fruits All fresh, dried, or frozen fruit. Canned fruit in natural juice (without addedsugar). Vegetables Fresh or frozen vegetables (raw, steamed, roasted, or grilled). Low-sodium or reduced-sodium tomato and vegetable juice. Low-sodium or reduced-sodium tomatosauce and tomato paste. Low-sodium or reduced-sodium canned vegetables. Grains Whole-grain or  whole-wheat bread. Whole-grain or whole-wheat pasta. Brown rice. Oatmeal. Quinoa. Bulgur. Whole-grain and low-sodium cereals. Pita bread.Low-fat, low-sodium crackers. Whole-wheat flour tortillas. Meats and other proteins Skinless chicken or turkey. Ground chicken or turkey. Pork with fat trimmed off. Fish and seafood. Egg whites. Dried beans, peas, or lentils. Unsalted nuts, nut butters, and seeds. Unsalted canned beans. Lean cuts of beef with fat trimmed off. Low-sodium, lean precooked or cured meat, such as sausages or meatloaves. Dairy Low-fat (1%) or fat-free (skim) milk. Reduced-fat, low-fat, or fat-free cheeses. Nonfat, low-sodium ricotta or cottage cheese. Low-fat or nonfatyogurt. Low-fat, low-sodium cheese. Fats and oils Soft margarine without trans fats. Vegetable oil. Reduced-fat, low-fat, or light mayonnaise and salad dressings (reduced-sodium). Canola, safflower, olive, avocado, soybean, andsunflower oils. Avocado. Seasonings and condiments Herbs. Spices. Seasoning mixes without salt. Other foods Unsalted popcorn and pretzels. Fat-free sweets. The items listed above may not be a complete list of foods and beverages you can eat. Contact a dietitian for more information. What foods should I avoid? Fruits Canned fruit in a light or heavy syrup. Fried fruit. Fruit in cream or buttersauce. Vegetables Creamed or fried vegetables. Vegetables in a cheese sauce. Regular canned vegetables (not low-sodium or reduced-sodium). Regular canned tomato sauce and paste (not low-sodium or reduced-sodium). Regular tomato and vegetable juice(not low-sodium or reduced-sodium). Pickles. Olives. Grains Baked goods made with fat, such as croissants, muffins, or some breads. Drypasta or rice meal packs. Meats and other proteins Fatty cuts of meat. Ribs. Fried meat. Bacon. Bologna, salami, and other precooked or cured meats, such as sausages or meat loaves. Fat from the back of a pig (fatback). Bratwurst.  Salted nuts and seeds. Canned beans with added salt. Canned orsmoked fish. Whole eggs or egg yolks. Chicken or turkey with skin. Dairy Whole or 2% milk, cream, and half-and-half. Whole or full-fat cream cheese. Whole-fat or sweetened yogurt. Full-fat cheese. Nondairy creamers. Whippedtoppings. Processed cheese and cheese spreads. Fats and oils Butter. Stick margarine. Lard. Shortening. Ghee. Bacon fat. Tropical oils, suchas coconut, palm kernel, or palm oil. Seasonings and condiments Onion salt, garlic salt, seasoned salt, table salt, and sea salt. Worcestershire sauce. Tartar sauce. Barbecue sauce. Teriyaki sauce. Soy sauce, including reduced-sodium. Steak sauce. Canned and packaged gravies. Fish sauce. Oyster sauce. Cocktail sauce. Store-bought horseradish. Ketchup. Mustard. Meat flavorings and tenderizers. Bouillon cubes. Hot sauces. Pre-made or packaged marinades. Pre-made or packaged taco seasonings. Relishes. Regular saladdressings. Other foods Salted popcorn and pretzels. The items listed above may not be a complete list of foods and beverages you should avoid. Contact a dietitian for more information. Where to find more information National Heart, Lung, and Blood Institute: www.nhlbi.nih.gov American Heart Association: www.heart.org Academy of Nutrition and Dietetics: www.eatright.org National Kidney Foundation: www.kidney.org Summary The DASH eating plan is a healthy eating plan that has been shown to reduce high blood pressure (hypertension). It may also reduce your risk for type 2 diabetes, heart disease, and stroke. When on the DASH eating plan, aim to eat more fresh fruits and vegetables, whole grains, lean proteins, low-fat dairy, and heart-healthy fats. With the DASH eating plan, you should limit salt (sodium) intake to 2,300   mg a day. If you have hypertension, you may need to reduce your sodium intake to 1,500 mg a day. Work with your health care provider or dietitian to adjust  your eating plan to your individual calorie needs. This information is not intended to replace advice given to you by your health care provider. Make sure you discuss any questions you have with your healthcare provider. Document Revised: 12/23/2018 Document Reviewed: 12/23/2018 Elsevier Patient Education  2022 Elsevier Inc.  

## 2020-08-12 ENCOUNTER — Ambulatory Visit: Payer: BC Managed Care – PPO | Attending: Family Medicine

## 2020-08-12 ENCOUNTER — Other Ambulatory Visit: Payer: Self-pay

## 2020-08-12 DIAGNOSIS — I10 Essential (primary) hypertension: Secondary | ICD-10-CM

## 2020-08-13 LAB — BASIC METABOLIC PANEL
BUN/Creatinine Ratio: 13 (ref 12–28)
BUN: 8 mg/dL (ref 8–27)
CO2: 25 mmol/L (ref 20–29)
Calcium: 9.5 mg/dL (ref 8.7–10.3)
Chloride: 100 mmol/L (ref 96–106)
Creatinine, Ser: 0.64 mg/dL (ref 0.57–1.00)
Glucose: 96 mg/dL (ref 65–99)
Potassium: 3.8 mmol/L (ref 3.5–5.2)
Sodium: 141 mmol/L (ref 134–144)
eGFR: 99 mL/min/{1.73_m2} (ref >59)

## 2020-08-16 ENCOUNTER — Telehealth: Payer: Self-pay

## 2020-08-16 ENCOUNTER — Encounter: Payer: Self-pay | Admitting: *Deleted

## 2020-08-16 NOTE — Telephone Encounter (Signed)
Patient name and DOB has been verified Patient was informed of lab results. Patient had no questions.  

## 2020-08-16 NOTE — Telephone Encounter (Signed)
-----   Message from Marcine Matar, MD sent at 08/13/2020  7:34 AM EDT ----- Let patient know that her kidney function is good.  Sodium, potassium and calcium levels are normal.

## 2020-10-28 ENCOUNTER — Encounter: Payer: Self-pay | Admitting: Family Medicine

## 2020-10-28 ENCOUNTER — Ambulatory Visit: Payer: BC Managed Care – PPO | Attending: Family Medicine | Admitting: Family Medicine

## 2020-10-28 ENCOUNTER — Other Ambulatory Visit: Payer: Self-pay

## 2020-10-28 VITALS — BP 133/64 | HR 66 | Ht 59.0 in | Wt 111.2 lb

## 2020-10-28 DIAGNOSIS — F17210 Nicotine dependence, cigarettes, uncomplicated: Secondary | ICD-10-CM | POA: Diagnosis not present

## 2020-10-28 DIAGNOSIS — I10 Essential (primary) hypertension: Secondary | ICD-10-CM | POA: Diagnosis not present

## 2020-10-28 DIAGNOSIS — Z1211 Encounter for screening for malignant neoplasm of colon: Secondary | ICD-10-CM

## 2020-10-28 DIAGNOSIS — Z720 Tobacco use: Secondary | ICD-10-CM

## 2020-10-28 MED ORDER — BUPROPION HCL ER (SR) 150 MG PO TB12
150.0000 mg | ORAL_TABLET | Freq: Two times a day (BID) | ORAL | 3 refills | Status: DC
  Filled 2020-10-28: qty 60, 30d supply, fill #0

## 2020-10-28 NOTE — Progress Notes (Signed)
Has only been taking 1 BP medication. Does not know which one.

## 2020-10-28 NOTE — Patient Instructions (Signed)

## 2020-10-28 NOTE — Progress Notes (Signed)
Subjective:  Patient ID: Raven Buckley, female    DOB: 1955/09/10  Age: 65 y.o. MRN: [id number]  CC: Hypertension   HPI Raven Buckley is a 65 y.o. year old female with a history of  is a 65 year old female with a history of hypertension who is seen for a follow-up visit.  Interval History: She has cut back on her smoking to 1-2 cig/day and is on Bupropion; requests refill today. Her blood pressure has significantly improved today and is normal.  At her last visit lisinopril/HCTZ dose was increased. Denies presence of chest pain, dyspnea or additional concerns. I had referred her for colonoscopy 3 months ago but this is still pending.  She would like a Cologuard exam instead. Declines flu shot. Past Medical History:  Diagnosis Date   Hypertension     History reviewed. No pertinent surgical history.  History reviewed. No pertinent family history.  Allergies  Allergen Reactions   Penicillins Other (See Comments)     pt stated: "I don't know it just bothers me but I did not have a rash or swelling"      Outpatient Medications Prior to Visit  Medication Sig Dispense Refill   amLODipine (NORVASC) 10 MG tablet TAKE 1 TABLET (10 MG TOTAL) BY MOUTH DAILY. 30 tablet 6   carvedilol (COREG) 25 MG tablet TAKE 1 TABLET (25 MG TOTAL) BY MOUTH 2 (TWO) TIMES DAILY WITH A MEAL. 180 tablet 1   lisinopril-hydrochlorothiazide (ZESTORETIC) 20-12.5 MG tablet Take 2 tablets by mouth daily. 180 tablet 1   tiZANidine (ZANAFLEX) 4 MG tablet TAKE 1 TABLET (4 MG TOTAL) BY MOUTH EVERY 8 (EIGHT) HOURS AS NEEDED FOR MUSCLE SPASMS. 60 tablet 0   buPROPion (WELLBUTRIN SR) 150 MG 12 hr tablet Take 1 tablet (150 mg total) by mouth 2 (two) times daily. For smoking cessation 60 tablet 3   nicotine (NICODERM CQ - DOSED IN MG/24 HOURS) 14 mg/24hr patch Place 1 patch (14 mg total) onto the skin daily. (Patient not taking: No sig reported) 28 patch 0   No facility-administered medications prior to visit.      ROS Review of Systems  Constitutional:  Negative for activity change, appetite change and fatigue.  HENT:  Negative for congestion, sinus pressure and sore throat.   Eyes:  Negative for visual disturbance.  Respiratory:  Negative for cough, chest tightness, shortness of breath and wheezing.   Cardiovascular:  Negative for chest pain and palpitations.  Gastrointestinal:  Negative for abdominal distention, abdominal pain and constipation.  Endocrine: Negative for polydipsia.  Genitourinary:  Negative for dysuria and frequency.  Musculoskeletal:  Negative for arthralgias and back pain.  Skin:  Negative for rash.  Neurological:  Negative for tremors, light-headedness and numbness.  Hematological:  Does not bruise/bleed easily.  Psychiatric/Behavioral:  Negative for agitation and behavioral problems.    Objective:  BP 133/64   Pulse 66   Ht 4\' 11"  (1.499 m)   Wt 111 lb 3.2 oz (50.4 kg)   SpO2 99%   BMI 22.46 kg/m   BP/Weight 10/28/2020 07/29/2020 03/11/2020  Systolic BP 133 158 177  Diastolic BP 64 80 81  Wt. (Lbs) 111.2 113.2 114.6  BMI 22.46 22.86 23.15      Physical Exam Constitutional:      Appearance: She is well-developed.  Cardiovascular:     Rate and Rhythm: Normal rate.     Heart sounds: Normal heart sounds. No murmur heard. Pulmonary:     Effort: Pulmonary effort is normal.  Breath sounds: Normal breath sounds. No wheezing or rales.  Chest:     Chest wall: No tenderness.  Abdominal:     General: Bowel sounds are normal. There is no distension.     Palpations: Abdomen is soft. There is no mass.     Tenderness: There is no abdominal tenderness.  Musculoskeletal:        General: Normal range of motion.     Right lower leg: No edema.     Left lower leg: No edema.  Neurological:     Mental Status: She is alert and oriented to person, place, and time.  Psychiatric:        Mood and Affect: Mood normal.    CMP Latest Ref Rng & Units 08/12/2020 03/11/2020  09/28/2019  Glucose 65 - 99 mg/dL 96 778(E) 96  BUN 8 - 27 mg/dL 8 9 7(L)  Creatinine 4.23 - 1.00 mg/dL 5.36 1.44 3.15  Sodium 134 - 144 mmol/L 141 143 143  Potassium 3.5 - 5.2 mmol/L 3.8 4.1 3.6  Chloride 96 - 106 mmol/L 100 104 105  CO2 20 - 29 mmol/L 25 21 25   Calcium 8.7 - 10.3 mg/dL 9.5 9.5 9.5  Total Protein 6.5 - 8.1 g/dL - - -  Total Bilirubin 0.3 - 1.2 mg/dL - - -  Alkaline Phos 38 - 126 U/L - - -  AST 15 - 41 U/L - - -  ALT 0 - 44 U/L - - -    Lipid Panel     Component Value Date/Time   CHOL 200 03/18/2019 1857   TRIG 60 03/18/2019 1857   HDL 47 03/18/2019 1857   CHOLHDL 4.3 03/18/2019 1857   VLDL 12 03/18/2019 1857   LDLCALC 141 (H) 03/18/2019 1857    CBC    Component Value Date/Time   WBC 5.4 03/19/2019 0503   RBC 4.17 03/19/2019 0503   HGB 12.4 03/19/2019 0503   HCT 39.2 03/19/2019 0503   PLT 340 03/19/2019 0503   MCV 94.0 03/19/2019 0503   MCH 29.7 03/19/2019 0503   MCHC 31.6 03/19/2019 0503   RDW 13.4 03/19/2019 0503   LYMPHSABS 2.1 01/07/2016 1716   MONOABS 0.5 01/07/2016 1716   EOSABS 0.1 01/07/2016 1716   BASOSABS 0.0 01/07/2016 1716    Lab Results  Component Value Date   HGBA1C 6.0 (H) 03/18/2019    Assessment & Plan:  1. Essential hypertension Controlled Continue amlodipine, lisinopril/HCTZ Counseled on blood pressure goal of less than 130/80, low-sodium, DASH diet, medication compliance, 150 minutes of moderate intensity exercise per week. Discussed medication compliance, adverse effects. - Basic Metabolic Panel  2. Tobacco abuse Commended on cutting back on her cigarette smoking Continue bupropion - buPROPion (WELLBUTRIN SR) 150 MG 12 hr tablet; Take 1 tablet (150 mg total) by mouth 2 (two) times daily. For smoking cessation  Dispense: 60 tablet; Refill: 3  3. Screening for colon cancer - Cologuard   Health Care Maintenance: defers Shingrix till next visit; declines flu shot Meds ordered this encounter  Medications   buPROPion  (WELLBUTRIN SR) 150 MG 12 hr tablet    Sig: Take 1 tablet (150 mg total) by mouth 2 (two) times daily. For smoking cessation    Dispense:  60 tablet    Refill:  3    Follow-up: Return in about 3 months (around 01/27/2021) for Hypertension.       01/29/2021, MD, FAAFP. [facility] [place], [place]   10/28/2020,  8:54 AM

## 2020-10-29 LAB — BASIC METABOLIC PANEL
BUN/Creatinine Ratio: 12 (ref 12–28)
BUN: 8 mg/dL (ref 8–27)
CO2: 28 mmol/L (ref 20–29)
Calcium: 9.4 mg/dL (ref 8.7–10.3)
Chloride: 97 mmol/L (ref 96–106)
Creatinine, Ser: 0.68 mg/dL (ref 0.57–1.00)
Glucose: 95 mg/dL (ref 70–99)
Potassium: 3.8 mmol/L (ref 3.5–5.2)
Sodium: 138 mmol/L (ref 134–144)
eGFR: 97 mL/min/{1.73_m2} (ref >59)

## 2020-11-01 ENCOUNTER — Telehealth: Payer: Self-pay

## 2020-11-01 NOTE — Telephone Encounter (Signed)
-----   Message from Hoy Register, MD sent at 10/29/2020  1:14 PM EDT ----- Please inform the patient that labs are normal. Thank you.

## 2020-11-01 NOTE — Telephone Encounter (Signed)
Pt was called and a VM was left informing patient of lab results. 

## 2021-02-04 ENCOUNTER — Encounter: Payer: Self-pay | Admitting: Family Medicine

## 2021-02-04 ENCOUNTER — Other Ambulatory Visit: Payer: Self-pay

## 2021-02-04 ENCOUNTER — Ambulatory Visit: Payer: Medicare Other | Attending: Family Medicine | Admitting: Family Medicine

## 2021-02-04 VITALS — BP 199/96 | HR 108 | Ht 59.0 in | Wt 110.0 lb

## 2021-02-04 DIAGNOSIS — K5909 Other constipation: Secondary | ICD-10-CM | POA: Diagnosis not present

## 2021-02-04 DIAGNOSIS — M25551 Pain in right hip: Secondary | ICD-10-CM

## 2021-02-04 DIAGNOSIS — F17210 Nicotine dependence, cigarettes, uncomplicated: Secondary | ICD-10-CM

## 2021-02-04 DIAGNOSIS — I10 Essential (primary) hypertension: Secondary | ICD-10-CM | POA: Diagnosis not present

## 2021-02-04 MED ORDER — DICLOFENAC SODIUM 1 % EX GEL
4.0000 g | Freq: Four times a day (QID) | CUTANEOUS | 1 refills | Status: DC
Start: 2021-02-04 — End: 2022-10-29
  Filled 2021-02-04: qty 100, 6d supply, fill #0
  Filled 2021-02-04: qty 100, 5d supply, fill #0

## 2021-02-04 MED ORDER — POLYETHYLENE GLYCOL 3350 17 GM/SCOOP PO POWD
17.0000 g | Freq: Every day | ORAL | 1 refills | Status: DC
Start: 2021-02-04 — End: 2023-06-01
  Filled 2021-02-04: qty 3350, 197d supply, fill #0
  Filled 2021-02-04: qty 510, 30d supply, fill #0

## 2021-02-04 MED ORDER — CLONIDINE HCL 0.1 MG PO TABS
0.1000 mg | ORAL_TABLET | Freq: Once | ORAL | Status: AC
  Administered 2021-02-04: 0.1 mg via ORAL

## 2021-02-04 NOTE — Progress Notes (Signed)
Subjective:  Patient ID: Raven Buckley, female    DOB: Jan 19, 1956  Age: 66 y.o. MRN: [id number]  CC: Hypertension   HPI Raven Buckley is a 66 y.o. year old female with a history of hypertension who is seen for a follow-up visit.  Interval History: She has no antihypertensives as she dropped her medications at work hence she did not have anything to take today.  Her blood pressure is severely elevated at 220/105 and she has no headache, blurry vision or chest pain.  Complains of RLQ for the last couple of days and this has been intermittent.  Denies history of trauma.  She has noticed straining to move her bowels  She has smoked greater than 20-pack-year.  Now smokes 3 cig/day but has smoked since she was 80 and smoked a pack then but has cut back recently. I prescribed patches and Bupropion previously for her and she endorses using them. Past Medical History:  Diagnosis Date   Hypertension     No past surgical history on file.  No family history on file.  Allergies  Allergen Reactions   Penicillins Other (See Comments)     pt stated: "I don't know it just bothers me but I did not have a rash or swelling"      Outpatient Medications Prior to Visit  Medication Sig Dispense Refill   amLODipine (NORVASC) 10 MG tablet TAKE 1 TABLET (10 MG TOTAL) BY MOUTH DAILY. 30 tablet 6   buPROPion (WELLBUTRIN SR) 150 MG 12 hr tablet Take 1 tablet (150 mg total) by mouth 2 (two) times daily. For smoking cessation 60 tablet 3   lisinopril-hydrochlorothiazide (ZESTORETIC) 20-12.5 MG tablet Take 2 tablets by mouth daily. 180 tablet 1   nicotine (NICODERM CQ - DOSED IN MG/24 HOURS) 14 mg/24hr patch Place 1 patch (14 mg total) onto the skin daily. 28 patch 0   carvedilol (COREG) 25 MG tablet TAKE 1 TABLET (25 MG TOTAL) BY MOUTH 2 (TWO) TIMES DAILY WITH A MEAL. 180 tablet 1   No facility-administered medications prior to visit.     ROS Review of Systems  Constitutional:  Negative for activity  change, appetite change and fatigue.  HENT:  Negative for congestion, sinus pressure and sore throat.   Eyes:  Negative for visual disturbance.  Respiratory:  Negative for cough, chest tightness, shortness of breath and wheezing.   Cardiovascular:  Negative for chest pain and palpitations.  Gastrointestinal:  Negative for abdominal distention, abdominal pain and constipation.  Endocrine: Negative for polydipsia.  Genitourinary:  Negative for dysuria and frequency.  Musculoskeletal:        See HPI  Skin:  Negative for rash.  Neurological:  Negative for tremors, light-headedness and numbness.  Hematological:  Does not bruise/bleed easily.  Psychiatric/Behavioral:  Negative for agitation and behavioral problems.    Objective:  BP (!) 199/96    Pulse (!) 108    Ht 4\' 11"  (1.499 m)    Wt 110 lb (49.9 kg)    SpO2 100%    BMI 22.22 kg/m   BP/Weight 02/04/2021 10/28/2020 [id number]  Systolic BP [id number]  Diastolic BP 96 64 80  Wt. (Lbs) 110 111.2 113.2  BMI 22.22 22.46 22.86      Physical Exam Constitutional:      Appearance: She is well-developed.  Cardiovascular:     Rate and Rhythm: Tachycardia present.     Heart sounds: Normal heart sounds. No murmur heard. Pulmonary:     Effort:  Pulmonary effort is normal.     Breath sounds: Normal breath sounds. No wheezing or rales.  Chest:     Chest wall: No tenderness.  Abdominal:     General: Bowel sounds are normal. There is no distension.     Palpations: Abdomen is soft. There is no mass.     Tenderness: There is no abdominal tenderness.  Musculoskeletal:        General: Normal range of motion.     Right lower leg: No edema.     Left lower leg: No edema.     Comments: Slight tenderness on palpation of Ala of pelvic girdle on the R. L is normal. Normal range of motion of both hips with no tenderness elicited  Neurological:     Mental Status: She is alert and oriented to person, place, and time.  Psychiatric:        Mood and  Affect: Mood normal.    CMP Latest Ref Rng & Units 10/28/2020 08/12/2020 03/11/2020  Glucose 70 - 99 mg/dL 95 96 116(H)  BUN 8 - 27 mg/dL 8 8 9   Creatinine 0.57 - 1.00 mg/dL 0.68 0.64 0.62  Sodium 134 - 144 mmol/L 138 141 143  Potassium 3.5 - 5.2 mmol/L 3.8 3.8 4.1  Chloride 96 - 106 mmol/L 97 100 104  CO2 20 - 29 mmol/L 28 25 21   Calcium 8.7 - 10.3 mg/dL 9.4 9.5 9.5  Total Protein 6.5 - 8.1 g/dL - - -  Total Bilirubin 0.3 - 1.2 mg/dL - - -  Alkaline Phos 38 - 126 U/L - - -  AST 15 - 41 U/L - - -  ALT 0 - 44 U/L - - -    Lipid Panel     Component Value Date/Time   CHOL 200 03/18/2019 1857   TRIG 60 03/18/2019 1857   HDL 47 03/18/2019 1857   CHOLHDL 4.3 03/18/2019 1857   VLDL 12 03/18/2019 1857   LDLCALC 141 (H) 03/18/2019 1857    CBC    Component Value Date/Time   WBC 5.4 03/19/2019 0503   RBC 4.17 03/19/2019 0503   HGB 12.4 03/19/2019 0503   HCT 39.2 03/19/2019 0503   PLT 340 03/19/2019 0503   MCV 94.0 03/19/2019 0503   MCH 29.7 03/19/2019 0503   MCHC 31.6 03/19/2019 0503   RDW 13.4 03/19/2019 0503   LYMPHSABS 2.1 01/07/2016 1716   MONOABS 0.5 01/07/2016 1716   EOSABS 0.1 01/07/2016 1716   BASOSABS 0.0 01/07/2016 1716    Lab Results  Component Value Date   HGBA1C 6.0 (H) 03/18/2019    Assessment & Plan:  1. Accelerated hypertension Accelerated Hypertension but she has no symptom of target organ affectation Uncontrolled due to the fact that she is yet to take antihypertensive Clonidine 0.1 mg given due to accelerated hypertension; Patient observed for 30 minutes and blood pressure repeated came back at 199/96 She does have refills at the pharmacy and has been encouraged to pick them up She will follow-up with the clinical pharmacist at her next visit to reassess her blood pressure and adjust her regimen accordingly Counseled on blood pressure goal of less than 130/80, low-sodium, DASH diet, medication compliance, 150 minutes of moderate intensity exercise per  week. Discussed medication compliance, adverse effects. - cloNIDine (CATAPRES) tablet 0.1 mg  2. Smoking greater than 20 pack years Counseled on smoking cessation for greater than 3 minutes and we have discussed hazardous effects of smoking; She is willing to work on quitting  She was previously prescribed nicotine patches and bupropion and has been advised to comply with them - CT CHEST LUNG CA SCREEN LOW DOSE W/O CM; Future  3. Right-sided ischial pain Pain is minimal on physical exam Placed on topical NSAID as she has severely elevated blood pressure which precludes my placing her on an oral NSAID - diclofenac Sodium (VOLTAREN) 1 % GEL; Apply 4 g topically 4 (four) times daily.  Dispense: 100 g; Refill: 1  4. Other constipation Advised to increase fiber intake, avoid white bread, white rice and substitute with whole-wheat products Will initiate MiraLAX - polyethylene glycol powder (GLYCOLAX/MIRALAX) 17 GM/SCOOP powder; Take 17 g by mouth daily.  Dispense: 3350 g; Refill: 1    Meds ordered this encounter  Medications   polyethylene glycol powder (GLYCOLAX/MIRALAX) 17 GM/SCOOP powder    Sig: Take 17 g by mouth daily.    Dispense:  3350 g    Refill:  1   diclofenac Sodium (VOLTAREN) 1 % GEL    Sig: Apply 4 g topically 4 (four) times daily.    Dispense:  100 g    Refill:  1   cloNIDine (CATAPRES) tablet 0.1 mg    Follow-up: Return in about 2 weeks (around 02/18/2021) for Blood pressure follow-up with Lurena Joiner; 3 months with PCP for medical conditions.Charlott Rakes, MD, FAAFP. [facility], [facility]   02/04/2021, 10:40 AM

## 2021-02-04 NOTE — Progress Notes (Signed)
Pain in right side. No medication this morning.

## 2021-02-21 ENCOUNTER — Telehealth: Payer: Self-pay | Admitting: Family Medicine

## 2021-02-21 NOTE — Telephone Encounter (Signed)
No recent call placed to patient.

## 2021-02-21 NOTE — Telephone Encounter (Signed)
Copied from CRM [id number]. Topic: General - Other >> Feb 20, 2021 10:20 AM Raven Buckley wrote: Reason for CRM: Pt stated she had a missed call from the office so she was returning the call.

## 2021-02-26 ENCOUNTER — Other Ambulatory Visit: Payer: Self-pay

## 2021-02-26 ENCOUNTER — Ambulatory Visit (HOSPITAL_COMMUNITY)
Admission: RE | Admit: 2021-02-26 | Discharge: 2021-02-26 | Disposition: A | Discharge status: Home / Self Care | Payer: Medicare Other | Source: Ambulatory Visit | Attending: Family Medicine | Admitting: Family Medicine

## 2021-02-26 ENCOUNTER — Ambulatory Visit: Payer: Medicare Other | Admitting: Pharmacist

## 2021-02-26 DIAGNOSIS — F17210 Nicotine dependence, cigarettes, uncomplicated: Secondary | ICD-10-CM | POA: Diagnosis present

## 2021-03-03 ENCOUNTER — Other Ambulatory Visit: Payer: Self-pay

## 2021-03-03 ENCOUNTER — Other Ambulatory Visit: Payer: Self-pay | Admitting: Family Medicine

## 2021-03-03 ENCOUNTER — Telehealth: Payer: Self-pay

## 2021-03-03 MED ORDER — ATORVASTATIN CALCIUM 20 MG PO TABS
20.0000 mg | ORAL_TABLET | Freq: Every day | ORAL | 3 refills | Status: DC
  Filled 2021-03-03: qty 30, 30d supply, fill #0

## 2021-03-03 NOTE — Telephone Encounter (Signed)
Patient name and DOB has been verified Patient was informed of lab results. Patient had no questions.  

## 2021-03-03 NOTE — Telephone Encounter (Signed)
-----   Message from Hoy Register, MD sent at 03/03/2021  1:02 PM EST ----- CT scan does not show evidence of cancer but does show cholesterol deposits in her arteries.  Please advise to work on a low-cholesterol diet and I have sent a prescription for cholesterol pills to her pharmacy.

## 2021-03-05 ENCOUNTER — Encounter: Payer: Self-pay | Admitting: Podiatry

## 2021-03-05 ENCOUNTER — Other Ambulatory Visit: Payer: Self-pay

## 2021-03-05 ENCOUNTER — Ambulatory Visit (INDEPENDENT_AMBULATORY_CARE_PROVIDER_SITE_OTHER): Payer: Medicare Other | Admitting: Podiatry

## 2021-03-05 DIAGNOSIS — M79674 Pain in right toe(s): Secondary | ICD-10-CM | POA: Diagnosis not present

## 2021-03-05 DIAGNOSIS — M79675 Pain in left toe(s): Secondary | ICD-10-CM | POA: Diagnosis not present

## 2021-03-05 DIAGNOSIS — B351 Tinea unguium: Secondary | ICD-10-CM

## 2021-03-05 DIAGNOSIS — Q828 Other specified congenital malformations of skin: Secondary | ICD-10-CM

## 2021-03-05 NOTE — Progress Notes (Signed)
This patient presents  to the office for evaluation and treatment of long thick painful nails and callus right fifth toe .  This patient is unable to trim her own nails since the patient cannot reach her feet.  Patient says the nails are painful walking and wearing his shoes.  Her callus is also very painful since she stands and walks at work.   She returns for preventive foot care services.  General Appearance  Alert, conversant and in no acute stress.  Vascular  Dorsalis pedis and posterior tibial  pulses are palpable  bilaterally.  Capillary return is within normal limits  bilaterally. Temperature is within normal limits  bilaterally.  Neurologic  Senn-Weinstein monofilament wire test within normal limits  bilaterally. Muscle power within normal limits bilaterally.  Nails Thick disfigured discolored nails with subungual debris  from hallux to fifth toes bilaterally. No evidence of bacterial infection or drainage bilaterally.  Orthopedic  No limitations of motion  feet .  No crepitus or effusions noted.  No bony pathology or digital deformities noted.  Skin  normotropic skin with no porokeratosis noted bilaterally.  No signs of infections or ulcers noted.   [place] durum fifth toe right foot.  Onychomycosis  Pain in toes right foot  Pain in toes left foot  [place] durum fifth toe right foot.  Debridement  of nails  1-5  B/L with a nail nipper.  Nails were then filed using a dremel tool with no incidents. Debride HD with # 15 blade.   RTC 3 months   Gardiner Barefoot DPM

## 2021-04-22 ENCOUNTER — Telehealth: Payer: Self-pay | Admitting: Pharmacist

## 2021-04-22 NOTE — Patient Outreach (Signed)
Patient appearing on report for [employer] Hypertension Control due to last documented ambulatory blood pressure of 199/96 on 02/04/2021. Next appointment with PCP is 05/06/21.  ? ?Outreached patient to discuss hypertension control and medication management. She asked that I call her back after 4 pm.  ? ?Called back at 4:15 pm, left a voicemail asking her to call me back at her convenience. ? ?Catie Feliz Beam, PharmD, BCACP ?[facility] ?[phone] ? ? ?

## 2021-04-24 ENCOUNTER — Telehealth: Payer: Self-pay | Admitting: Pharmacist

## 2021-04-24 NOTE — Patient Outreach (Signed)
Patient appearing on report for [employer] Hypertension Control. Called again to follow up. She notes she is fixing lunch right now, requests I call back after 4 pm tomorrow.  ? ?Raven Buckley, PharmD, BCACP ?[facility] ?[phone] ? ? ?

## 2021-05-06 ENCOUNTER — Ambulatory Visit: Payer: Medicare Other | Attending: Family Medicine | Admitting: Family Medicine

## 2021-05-06 ENCOUNTER — Encounter: Payer: Self-pay | Admitting: Family Medicine

## 2021-05-06 ENCOUNTER — Other Ambulatory Visit: Payer: Self-pay

## 2021-05-06 VITALS — BP 162/84 | HR 99 | Ht 59.0 in | Wt 109.6 lb

## 2021-05-06 DIAGNOSIS — R058 Other specified cough: Secondary | ICD-10-CM

## 2021-05-06 DIAGNOSIS — E2839 Other primary ovarian failure: Secondary | ICD-10-CM | POA: Diagnosis not present

## 2021-05-06 DIAGNOSIS — Z91148 Patient's other noncompliance with medication regimen for other reason: Secondary | ICD-10-CM | POA: Diagnosis not present

## 2021-05-06 DIAGNOSIS — Z79899 Other long term (current) drug therapy: Secondary | ICD-10-CM | POA: Insufficient documentation

## 2021-05-06 DIAGNOSIS — Z1211 Encounter for screening for malignant neoplasm of colon: Secondary | ICD-10-CM

## 2021-05-06 DIAGNOSIS — I10 Essential (primary) hypertension: Secondary | ICD-10-CM | POA: Diagnosis present

## 2021-05-06 DIAGNOSIS — R7303 Prediabetes: Secondary | ICD-10-CM | POA: Diagnosis not present

## 2021-05-06 DIAGNOSIS — Z13228 Encounter for screening for other metabolic disorders: Secondary | ICD-10-CM

## 2021-05-06 DIAGNOSIS — Z87891 Personal history of nicotine dependence: Secondary | ICD-10-CM | POA: Insufficient documentation

## 2021-05-06 MED ORDER — CETIRIZINE HCL 10 MG PO TABS
10.0000 mg | ORAL_TABLET | Freq: Every day | ORAL | 1 refills | Status: DC
Start: 2021-05-06 — End: 2023-06-01
  Filled 2021-05-06: qty 30, 30d supply, fill #0

## 2021-05-06 NOTE — Progress Notes (Signed)
? ?Subjective:  ?Patient ID: Raven Buckley, female    DOB: March 20, 1955  Age: 66 y.o. MRN: [id number] ? ?CC: Hypertension ? ? ?HPI ?Raven Buckley is a 66 y.o. year old female with a history of hypertension, tobacco abuse here for chronic disease management. ? ?Interval History: ? ?She has been coughing and sneezing for the last few days but has no fever, myalgia, sinus tenderness, sore throat ?Using some robitussin with mild relief. ?BP is elevated and she states she ran out of her medications but review of her list indicates she has refills at the pharmacy which she is yet to pick up. ? ?She has not smoked since last week.  Currently on bupropion for smoking cessation. ?Past Medical History:  ?Diagnosis Date  ? Hypertension   ? ? ?History reviewed. No pertinent surgical history. ? ?History reviewed. No pertinent family history. ? ?Social History  ? ?Socioeconomic History  ? Marital status: Single  ?  Spouse name: Not on file  ? Number of children: Not on file  ? Years of education: Not on file  ? Highest education level: Not on file  ?Occupational History  ? Not on file  ?Tobacco Use  ? Smoking status: Every Day  ?  Packs/day: 0.50  ?  Types: Cigarettes  ? Smokeless tobacco: Never  ?Substance and Sexual Activity  ? Alcohol use: No  ? Drug use: Yes  ?  Types: Marijuana  ? Sexual activity: Not on file  ?Other Topics Concern  ? Not on file  ?Social History Narrative  ? Not on file  ? ?Social Determinants of Health  ? ?Financial Resource Strain: Not on file  ?Food Insecurity: Not on file  ?Transportation Needs: Not on file  ?Physical Activity: Not on file  ?Stress: Not on file  ?Social Connections: Not on file  ? ? ?Allergies  ?Allergen Reactions  ? Penicillins Other (See Comments)  ?   pt stated: "I don't know it just bothers me but I did not have a rash or swelling" ? ?  ? ? ?Outpatient Medications Prior to Visit  ?Medication Sig Dispense Refill  ? atorvastatin (LIPITOR) 20 MG tablet Take 1 tablet (20 mg total) by  mouth daily. 30 tablet 3  ? buPROPion (WELLBUTRIN SR) 150 MG 12 hr tablet Take 1 tablet (150 mg total) by mouth 2 (two) times daily. For smoking cessation 60 tablet 3  ? diclofenac Sodium (VOLTAREN) 1 % GEL Apply 4 g topically 4 (four) times daily. 100 g 1  ? lisinopril-hydrochlorothiazide (ZESTORETIC) 20-12.5 MG tablet Take 2 tablets by mouth daily. 180 tablet 1  ? nicotine (NICODERM CQ - DOSED IN MG/24 HOURS) 14 mg/24hr patch Place 1 patch (14 mg total) onto the skin daily. 28 patch 0  ? polyethylene glycol powder (GLYCOLAX/MIRALAX) 17 GM/SCOOP powder Take 17 g by mouth daily. 3350 g 1  ? amLODipine (NORVASC) 10 MG tablet TAKE 1 TABLET (10 MG TOTAL) BY MOUTH DAILY. 30 tablet 6  ? carvedilol (COREG) 25 MG tablet TAKE 1 TABLET (25 MG TOTAL) BY MOUTH 2 (TWO) TIMES DAILY WITH A MEAL. 180 tablet 1  ? ?No facility-administered medications prior to visit.  ? ? ? ?ROS ?Review of Systems  ?Constitutional:  Negative for activity change, appetite change and fatigue.  ?HENT:  Negative for congestion, sinus pressure and sore throat.   ?Eyes:  Negative for visual disturbance.  ?Respiratory:  Positive for cough and shortness of breath. Negative for chest tightness and wheezing.   ?Cardiovascular:  Negative for chest pain and palpitations.  ?Gastrointestinal:  Negative for abdominal distention, abdominal pain and constipation.  ?Endocrine: Negative for polydipsia.  ?Genitourinary:  Negative for dysuria and frequency.  ?Musculoskeletal:  Negative for arthralgias and back pain.  ?Skin:  Negative for rash.  ?Neurological:  Negative for tremors, light-headedness and numbness.  ?Hematological:  Does not bruise/bleed easily.  ?Psychiatric/Behavioral:  Negative for agitation and behavioral problems.   ? ?Objective:  ?BP (!) 162/84   Pulse 99   Ht '4\' 11"'  (1.499 m)   Wt 109 lb 9.6 oz (49.7 kg)   SpO2 100%   BMI 22.14 kg/m?  ? ? ?  05/06/2021  ?  8:38 AM 02/04/2021  ?  9:59 AM 02/04/2021  ?  8:41 AM  ?BP/Weight  ?Systolic BP 790 383 338   ?Diastolic BP 84 96 329  ?Wt. (Lbs) 109.6  110  ?BMI 22.14 kg/m2  22.22 kg/m2  ? ? ? ? ?Physical Exam ?Constitutional:   ?   Appearance: She is well-developed.  ?HENT:  ?   Right Ear: Tympanic membrane normal.  ?   Left Ear: Tympanic membrane normal.  ?   Mouth/Throat:  ?   Mouth: Mucous membranes are moist.  ?Cardiovascular:  ?   Rate and Rhythm: Normal rate.  ?   Heart sounds: Normal heart sounds. No murmur heard. ?Pulmonary:  ?   Effort: Pulmonary effort is normal.  ?   Breath sounds: Normal breath sounds. No wheezing or rales.  ?Chest:  ?   Chest wall: No tenderness.  ?Abdominal:  ?   General: Bowel sounds are normal. There is no distension.  ?   Palpations: Abdomen is soft. There is no mass.  ?   Tenderness: There is no abdominal tenderness.  ?Musculoskeletal:     ?   General: Normal range of motion.  ?   Right lower leg: No edema.  ?   Left lower leg: No edema.  ?Neurological:  ?   Mental Status: She is alert and oriented to person, place, and time.  ?Psychiatric:     ?   Mood and Affect: Mood normal.  ? ? ? ?  Latest Ref Rng & Units 10/28/2020  ?  9:08 AM 08/12/2020  ?  8:34 AM 03/11/2020  ? 10:14 AM  ?CMP  ?Glucose 70 - 99 mg/dL 95   96   116    ?BUN 8 - 27 mg/dL '8   8   9    ' ?Creatinine 0.57 - 1.00 mg/dL 0.68   0.64   0.62    ?Sodium 134 - 144 mmol/L 138   141   143    ?Potassium 3.5 - 5.2 mmol/L 3.8   3.8   4.1    ?Chloride 96 - 106 mmol/L 97   100   104    ?CO2 20 - 29 mmol/L '28   25   21    ' ?Calcium 8.7 - 10.3 mg/dL 9.4   9.5   9.5    ? ? ?Lipid Panel  ?   ?Component Value Date/Time  ? CHOL 200 03/18/2019 1857  ? TRIG 60 03/18/2019 1857  ? HDL 47 03/18/2019 1857  ? CHOLHDL 4.3 03/18/2019 1857  ? VLDL 12 03/18/2019 1857  ? [place] 141 (H) 03/18/2019 1857  ? ? ?CBC ?   ?Component Value Date/Time  ? WBC 5.4 03/19/2019 0503  ? RBC 4.17 03/19/2019 0503  ? HGB 12.4 03/19/2019 0503  ? HCT 39.2 03/19/2019 0503  ?  PLT 340 03/19/2019 0503  ? MCV 94.0 03/19/2019 0503  ? MCH 29.7 03/19/2019 0503  ? MCHC 31.6 03/19/2019  0503  ? RDW 13.4 03/19/2019 0503  ? LYMPHSABS 2.1 01/07/2016 1716  ? MONOABS 0.5 01/07/2016 1716  ? EOSABS 0.1 01/07/2016 1716  ? BASOSABS 0.0 01/07/2016 1716  ? ? ?Lab Results  ?Component Value Date  ? HGBA1C 6.0 (H) 03/18/2019  ? ? ?Assessment & Plan:  ?1. Essential hypertension ?Uncontrolled due to medication nonadherence ?Advised to pick up medication from pharmacy ?Counseled on blood pressure goal of less than 130/80, low-sodium, DASH diet, medication compliance, 150 minutes of moderate intensity exercise per week. ?Discussed medication compliance, adverse effects. ? ?- LP+Non-HDL Cholesterol ?- CMP14+EGFR ?- CBC with Differential/Platelet ? ?2. Nonadherence to medication ?This is an ongoing problem for her that we have discussed at several visits.  It does seem like she might have some challenges with regards to comprehension which is impacting her adherence ? ?3. Screening for colon cancer ?Previously referred for Cologuard but she never completed this test ?- Ambulatory referral to Gastroenterology ? ?4. Estrogen deficiency ?- DG Bone Density; Future ? ?5. Prediabetes ?Last A1c was 6.0 ?Continue lifestyle modifications to prevent progression to type 2 diabetes mellitus ?- Hemoglobin A1c ? ?6. Other cough ?Symptoms could be related to allergic rhinitis ?Placed on antihistamine ? ? ?Health Care Maintenance: Up-to-date on lung cancer screening ?Meds ordered this encounter  ?Medications  ? cetirizine (ZYRTEC) 10 MG tablet  ?  Sig: Take 1 tablet (10 mg total) by mouth daily.  ?  Dispense:  30 tablet  ?  Refill:  1  ? ? ?Follow-up: Return in about 3 months (around 08/05/2021) for Chronic medical conditions.  ? ? ? ? ? ?Charlott Rakes, MD, FAAFP. ?[facility] ?[place], [place] ?[phone]   ?05/06/2021, 11:10 AM ?

## 2021-05-06 NOTE — Patient Instructions (Signed)
Bone Density Test °A bone density test uses a type of X-ray to measure the amount of calcium and other minerals in a person's bones. It can measure bone density in the hip and the spine. The test is similar to having a regular X-ray. This test may also be called: °Bone densitometry. °Bone mineral density test. °Dual-energy X-ray absorptiometry (DEXA). °You may have this test to: °Diagnose a condition that causes weak or thin bones (osteoporosis). °Screen you for osteoporosis. °Predict your risk for a broken bone (fracture). °Determine how well your osteoporosis treatment is working. °Tell a health care provider about: °Any allergies you have. °All medicines you are taking, including vitamins, herbs, eye drops, creams, and over-the-counter medicines. °Any problems you or family members have had with anesthetic medicines. °Any blood disorders you have. °Any surgeries you have had. °Any medical conditions you have. °Whether you are pregnant or may be pregnant. °Any medical tests you have had within the past 14 days that used contrast material. °What are the risks? °Generally, this is a safe test. However, it does expose you to a small amount of radiation, which can slightly increase your cancer risk. °What happens before the test? °Do not take any calcium supplements within the 24 hours before your test. °You will need to remove all metal jewelry, eyeglasses, removable dental appliances, and any other metal objects on your body. °What happens during the test? ° °You will lie down on an exam table. There will be an X-ray generator below you and an imaging device above you. °Other devices, such as boxes or braces, may be used to position your body properly for the scan. °The machine will slowly scan your body. You will need to keep very still while the machine does the scan. °The images will show up on a screen in the room. Images will be examined by a specialist after your test is finished. °The procedure may vary among  health care providers and hospitals. °What can I expect after the test? °It is up to you to get the results of your test. Ask your health care provider, or the department that is doing the test, when your results will be ready. °Summary °A bone density test is an imaging test that uses a type of X-ray to measure the amount of calcium and other minerals in your bones. °The test may be used to diagnose or screen you for a condition that causes weak or thin bones (osteoporosis), predict your risk for a broken bone (fracture), or determine how well your osteoporosis treatment is working. °Do not take any calcium supplements within 24 hours before your test. °Ask your health care provider, or the department that is doing the test, when your results will be ready. °This information is not intended to replace advice given to you by your health care provider. Make sure you discuss any questions you have with your health care provider. °Document Revised: 10/02/2020 Document Reviewed: 07/06/2019 °Elsevier Patient Education © 2022 [employer] ° °

## 2021-05-07 LAB — CBC WITH DIFFERENTIAL/PLATELET
Basophils Absolute: 0 10*3/uL (ref 0.0–0.2)
Basos: 0 %
EOS (ABSOLUTE): 0.1 10*3/uL (ref 0.0–0.4)
Eos: 3 %
Hematocrit: 36.2 % (ref 34.0–46.6)
Hemoglobin: 11.9 g/dL (ref 11.1–15.9)
Immature Grans (Abs): 0 10*3/uL (ref 0.0–0.1)
Immature Granulocytes: 0 %
Lymphocytes Absolute: 2.4 10*3/uL (ref 0.7–3.1)
Lymphs: 56 %
MCH: 29.1 pg (ref 26.6–33.0)
MCHC: 32.9 g/dL (ref 31.5–35.7)
MCV: 89 fL (ref 79–97)
Monocytes Absolute: 0.5 10*3/uL (ref 0.1–0.9)
Monocytes: 11 %
Neutrophils Absolute: 1.3 10*3/uL — ABNORMAL LOW (ref 1.4–7.0)
Neutrophils: 30 %
Platelets: 344 10*3/uL (ref 150–450)
RBC: 4.09 x10E6/uL (ref 3.77–5.28)
RDW: 13.1 % (ref 11.7–15.4)
WBC: 4.3 10*3/uL (ref 3.4–10.8)

## 2021-05-07 LAB — HEMOGLOBIN A1C
Est. average glucose Bld gHb Est-mCnc: 126 mg/dL
Hgb A1c MFr Bld: 6 % — ABNORMAL HIGH (ref 4.8–5.6)

## 2021-05-07 LAB — CMP14+EGFR
ALT: 9 IU/L (ref 0–32)
AST: 17 IU/L (ref 0–40)
Albumin/Globulin Ratio: 1.7 (ref 1.2–2.2)
Albumin: 4.2 g/dL (ref 3.8–4.8)
Alkaline Phosphatase: 98 IU/L (ref 44–121)
BUN/Creatinine Ratio: 10 — ABNORMAL LOW (ref 12–28)
BUN: 7 mg/dL — ABNORMAL LOW (ref 8–27)
Bilirubin Total: 0.2 mg/dL (ref 0.0–1.2)
CO2: 24 mmol/L (ref 20–29)
Calcium: 9.5 mg/dL (ref 8.7–10.3)
Chloride: 103 mmol/L (ref 96–106)
Creatinine, Ser: 0.68 mg/dL (ref 0.57–1.00)
Globulin, Total: 2.5 g/dL (ref 1.5–4.5)
Glucose: 103 mg/dL — ABNORMAL HIGH (ref 70–99)
Potassium: 4.1 mmol/L (ref 3.5–5.2)
Sodium: 140 mmol/L (ref 134–144)
Total Protein: 6.7 g/dL (ref 6.0–8.5)
eGFR: 97 mL/min/{1.73_m2} (ref >59)

## 2021-05-07 LAB — LP+NON-HDL CHOLESTEROL
Cholesterol, Total: 167 mg/dL (ref 100–199)
HDL: 45 mg/dL (ref >39)
LDL Chol Calc (NIH): 110 mg/dL — ABNORMAL HIGH (ref 0–99)
Total Non-HDL-Chol (LDL+VLDL): 122 mg/dL (ref 0–129)
Triglycerides: 61 mg/dL (ref 0–149)
VLDL Cholesterol Cal: 12 mg/dL (ref 5–40)

## 2021-05-14 ENCOUNTER — Telehealth: Payer: Self-pay | Admitting: *Deleted

## 2021-05-14 NOTE — Telephone Encounter (Signed)
Result note from 05/07/21 read to pt, verbalizes understanding. ?

## 2021-05-15 ENCOUNTER — Other Ambulatory Visit: Payer: Self-pay

## 2021-05-15 ENCOUNTER — Other Ambulatory Visit: Payer: Self-pay | Admitting: Pharmacist

## 2021-05-15 ENCOUNTER — Telehealth: Payer: Self-pay | Admitting: Pharmacist

## 2021-05-15 DIAGNOSIS — I10 Essential (primary) hypertension: Secondary | ICD-10-CM

## 2021-05-15 MED ORDER — AMLODIPINE BESYLATE 10 MG PO TABS
ORAL_TABLET | Freq: Every day | ORAL | 6 refills | Status: DC
  Filled 2021-05-15: qty 30, 30d supply, fill #0
  Filled 2021-11-13: qty 30, 30d supply, fill #1

## 2021-05-15 MED ORDER — CARVEDILOL 25 MG PO TABS
ORAL_TABLET | Freq: Two times a day (BID) | ORAL | 1 refills | Status: DC
  Filled 2021-05-15: qty 60, 30d supply, fill #0
  Filled 2021-11-13: qty 60, 30d supply, fill #1

## 2021-05-15 MED ORDER — LISINOPRIL-HYDROCHLOROTHIAZIDE 20-12.5 MG PO TABS: 2.0000 | ORAL_TABLET | Freq: Every day | ORAL | 1 refills | Status: DC

## 2021-05-15 NOTE — Patient Outreach (Signed)
Patient appearing on report for [employer] - Hypertension Control report due to last documented ambulatory blood pressure of 162/84 on 05/06/21. Next appointment with PCP is 08/19/21  ? ?Outreached patient to discuss hypertension control and medication management. Medication nonadherence noted at last PCP visit ? ?Current antihypertensives: amlodipine 10 mg, losartan/HCTZ 20/12.5 mg, carvedilol 25 mg twice daily - reports she needs refills on these medications ? ?Patient does not have an automated upper arm home BP machine. ? ? ?Assessment/Plan: ?- Currently uncontrolled ?- Reviewed goal blood pressure <130/80 ?- Will collaborate with embedded pharmacist for refills on antihypertensives. Patient reports she can pick up her medications after work tomorrow ?- Discussed home BP monitoring. Patient amenable to getting cuff when at the pharmacy tomorrow. Called [facility], ask that they counsel her on use tomorrow when she picks up her medications.  ? ?Catie Hedwig Morton, PharmD, BCACP ?[facility] ?[phone] ? ?

## 2021-05-16 ENCOUNTER — Other Ambulatory Visit: Payer: Self-pay

## 2021-05-21 ENCOUNTER — Other Ambulatory Visit: Payer: Self-pay

## 2021-05-29 ENCOUNTER — Telehealth: Payer: Self-pay | Admitting: Pharmacist

## 2021-05-29 ENCOUNTER — Ambulatory Visit: Payer: Self-pay

## 2021-05-29 NOTE — Telephone Encounter (Signed)
Patient appearing on report for [employer] [facility] - Hypertension Control report due to last documented ambulatory blood pressure of 162/84 on 05/06/21. Outreached patient to follow up on her plan to pick up a new blood pressure machine from the pharmacy 2 weeks ago and begin home monitoring.  ? ?Left voicemail for her to return my call at her convenience.  ? ?Catie Eppie Gibson, PharmD, BCACP ?[facility] ?[phone] ? ? ?

## 2021-06-04 ENCOUNTER — Encounter: Payer: Medicare Other | Admitting: Podiatry

## 2021-06-04 NOTE — Progress Notes (Signed)
NO SHOW. This encounter was created in error - please disregard.

## 2021-06-20 ENCOUNTER — Ambulatory Visit: Payer: Medicare Other

## 2021-07-24 ENCOUNTER — Ambulatory Visit: Payer: Medicare Other

## 2021-07-24 DIAGNOSIS — Z0000 Encounter for general adult medical examination without abnormal findings: Secondary | ICD-10-CM

## 2021-07-24 NOTE — Progress Notes (Deleted)
Subjective:   Raven Buckley is a 66 y.o. female who presents for Medicare Annual (Subsequent) preventive examination. I discussed the limitations of evaluation and management by telemedicine and the availability of in person appointments. The patient expressed understanding and agreed to proceed.   Visit performed by audio   Patient location: Home  Provider location: Home  Review of Systems    N/A       Objective:    There were no vitals filed for this visit. There is no height or weight on file to calculate BMI.     03/18/2019    7:18 PM 03/13/2016    2:13 PM 01/16/2016   10:28 AM 01/07/2016    4:58 PM 08/29/2011    5:51 AM  Advanced Directives  Does Patient Have a Medical Advance Directive? No No No No Patient does not have advance directive;Patient would not like information  Would patient like information on creating a medical advance directive? No - Patient declined  No - Patient declined No - Patient declined   Pre-existing out of facility DNR order (yellow form or pink MOST form)     No    Current Medications (verified) Outpatient Encounter Medications as of 07/24/2021  Medication Sig   amLODipine (NORVASC) 10 MG tablet TAKE 1 TABLET (10 MG TOTAL) BY MOUTH DAILY.   atorvastatin (LIPITOR) 20 MG tablet Take 1 tablet (20 mg total) by mouth daily.   buPROPion (WELLBUTRIN SR) 150 MG 12 hr tablet Take 1 tablet (150 mg total) by mouth 2 (two) times daily. For smoking cessation   carvedilol (COREG) 25 MG tablet TAKE 1 TABLET (25 MG TOTAL) BY MOUTH 2 (TWO) TIMES DAILY WITH A MEAL.   cetirizine (ZYRTEC) 10 MG tablet Take 1 tablet (10 mg total) by mouth daily.   diclofenac Sodium (VOLTAREN) 1 % GEL Apply 4 g topically 4 (four) times daily.   lisinopril-hydrochlorothiazide (ZESTORETIC) 20-12.5 MG tablet Take 2 tablets by mouth daily.   nicotine (NICODERM CQ - DOSED IN MG/24 HOURS) 14 mg/24hr patch Place 1 patch (14 mg total) onto the skin daily.   polyethylene glycol powder  (GLYCOLAX/MIRALAX) 17 GM/SCOOP powder Take 17 g by mouth daily.   No facility-administered encounter medications on file as of 07/24/2021.    Allergies (verified) Penicillins   History: Past Medical History:  Diagnosis Date   Hypertension    No past surgical history on file. No family history on file. Social History   Socioeconomic History   Marital status: Single    Spouse name: Not on file   Number of children: Not on file   Years of education: Not on file   Highest education level: Not on file  Occupational History   Not on file  Tobacco Use   Smoking status: Every Day    Packs/day: 0.50    Types: Cigarettes   Smokeless tobacco: Never  Substance and Sexual Activity   Alcohol use: No   Drug use: Yes    Types: Marijuana   Sexual activity: Not on file  Other Topics Concern   Not on file  Social History Narrative   Not on file   Social Determinants of Health   Financial Resource Strain: Not on file  Food Insecurity: Not on file  Transportation Needs: Not on file  Physical Activity: Not on file  Stress: Not on file  Social Connections: Not on file    Tobacco Counseling Ready to quit: Not Answered Counseling given: Not Answered   Clinical Intake:  Diabetic?***         Activities of Daily Living     No data to display          Patient Care Team: Hoy Register, MD as PCP - General (Family Medicine)  Indicate any recent Medical Services you may have received from other than [facility] providers in the past year (date may be approximate).     Assessment:   This is a routine wellness examination for [place].  Hearing/Vision screen No results found.  Dietary issues and exercise activities discussed:     Goals Addressed   None    Depression Screen    05/06/2021    8:41 AM 02/04/2021    8:49 AM 10/28/2020    8:44 AM 07/29/2020    8:44 AM 03/11/2020    9:46 AM 09/28/2019   10:34 AM 08/21/2019   12:10 PM  PHQ 2/9 Scores   PHQ - 2 Score 0 0 0 0 0 0 0  PHQ- 9 Score 0 0 0 0 0 0 0    Fall Risk    05/06/2021    8:40 AM 02/04/2021    8:49 AM 07/29/2020    8:44 AM 03/11/2020    9:44 AM 09/28/2019   10:29 AM  Fall Risk   Falls in the past year? 0 0 0 0 0  Number falls in past yr: 0 0 0 0   Injury with Fall? 0 0 0 0   Risk for fall due to :     No Fall Risks    FALL RISK PREVENTION PERTAINING TO THE HOME:  Any stairs in or around the home? {YES/NO:21197} If so, are there any without handrails? {YES/NO:21197} Home free of loose throw rugs in walkways, pet beds, electrical cords, etc? {YES/NO:21197} Adequate lighting in your home to reduce risk of falls? {YES/NO:21197}  ASSISTIVE DEVICES UTILIZED TO PREVENT FALLS:  Life alert? {YES/NO:21197} Use of a cane, walker or w/c? {YES/NO:21197} Grab bars in the bathroom? {YES/NO:21197} Shower chair or bench in shower? {YES/NO:21197} Elevated toilet seat or a handicapped toilet? {YES/NO:21197}  TIMED UP AND GO:  Was the test performed? No .  Length of time to ambulate 10 feet: 0 sec.     Cognitive Function:        Immunizations Immunization History  Administered Date(s) Administered   PFIZER(Purple Top)SARS-COV-2 Vaccination 03/09/2020    TDAP status: Due, Education has been provided regarding the importance of this vaccine. Advised may receive this vaccine at local pharmacy or Health Dept. Aware to provide a copy of the vaccination record if obtained from local pharmacy or Health Dept. Verbalized acceptance and understanding.  Flu Vaccine status: Due, Education has been provided regarding the importance of this vaccine. Advised may receive this vaccine at local pharmacy or Health Dept. Aware to provide a copy of the vaccination record if obtained from local pharmacy or Health Dept. Verbalized acceptance and understanding.  Pneumococcal vaccine status: Due, Education has been provided regarding the importance of this vaccine. Advised may receive this  vaccine at local pharmacy or Health Dept. Aware to provide a copy of the vaccination record if obtained from local pharmacy or Health Dept. Verbalized acceptance and understanding.  Covid-19 vaccine status: Information provided on how to obtain vaccines.   Qualifies for Shingles Vaccine? Yes   Zostavax completed No   Shingrix Completed?: No.    Education has been provided regarding the importance of this vaccine. Patient has been advised to call insurance company to determine out of  pocket expense if they have not yet received this vaccine. Advised may also receive vaccine at local pharmacy or Health Dept. Verbalized acceptance and understanding.  Screening Tests Health Maintenance  Topic Date Due   COLONOSCOPY (Pts 45-52yrs Insurance coverage will need to be confirmed)  Never done   Zoster Vaccines- Shingrix (1 of 2) Never done   COVID-19 Vaccine (2 - Pfizer series) 05/04/2020   DEXA SCAN  Never done   TETANUS/TDAP  10/28/2021 (Originally 11/13/1974)   Pneumonia Vaccine 24+ Years old (1 - PCV) 02/04/2022 (Originally 11/12/1961)   INFLUENZA VACCINE  09/02/2021   MAMMOGRAM  11/08/2021   Hepatitis C Screening  Completed   HIV Screening  Completed   HPV VACCINES  Aged Out   PAP SMEAR-Modifier  Discontinued    Health Maintenance  Health Maintenance Due  Topic Date Due   COLONOSCOPY (Pts 45-2yrs Insurance coverage will need to be confirmed)  Never done   Zoster Vaccines- Shingrix (1 of 2) Never done   COVID-19 Vaccine (2 - Pfizer series) 05/04/2020   DEXA SCAN  Never done    Mammogram status: Completed No, pt informed to contact PCP office to schedule . Repeat every 10 year  Mammogram status: Completed No, pt informed to contact PCP office to schedule . Repeat every year  Bone Density status: Ordered 05/06/2021. Pt provided with contact info and advised to call to schedule appt.  Lung Cancer Screening: (Low Dose CT Chest recommended if Age 77-80 years, 30 pack-year currently  smoking OR have quit w/in 15years.) does qualify.   Lung Cancer Screening Referral: No  Additional Screening:  Hepatitis C Screening: does qualify; Completed Yes  Vision Screening: Recommended annual ophthalmology exams for early detection of glaucoma and other disorders of the eye. Is the patient up to date with their annual eye exam?  {YES/NO:21197} Who is the provider or what is the name of the office in which the patient attends annual eye exams? *** If pt is not established with a provider, would they like to be referred to a provider to establish care? {YES/NO:21197}.   Dental Screening: Recommended annual dental exams for proper oral hygiene  Community Resource Referral / Chronic Care Management: CRR required this visit?  No   CCM required this visit?  No      Plan:     I have personally reviewed and noted the following in the patient's chart:   Medical and social history Use of alcohol, tobacco or illicit drugs  Current medications and supplements including opioid prescriptions.  Functional ability and status Nutritional status Physical activity Advanced directives List of other physicians Hospitalizations, surgeries, and ER visits in previous 12 months Vitals Screenings to include cognitive, depression, and falls Referrals and appointments  In addition, I have reviewed and discussed with patient certain preventive protocols, quality metrics, and best practice recommendations. A written personalized care plan for preventive services as well as general preventive health recommendations were provided to patient.     Renato Gails, [place]   07/24/2021   Nurse Notes: ***

## 2021-08-18 ENCOUNTER — Telehealth: Payer: Self-pay | Admitting: Pharmacist

## 2021-08-18 NOTE — Telephone Encounter (Signed)
Patient attempted to be outreached by Ahmaya Smalls Daly, PharmD Candidate on 08/15/21 to discuss hypertension. Left voicemail for patient to return our call at their convenience.   Catie T. Ayano Douthitt, PharmD, BCACP  [facility]  [phone]     

## 2021-08-19 ENCOUNTER — Other Ambulatory Visit: Payer: Self-pay

## 2021-08-19 ENCOUNTER — Ambulatory Visit: Payer: Medicare Other | Attending: Family Medicine | Admitting: Family Medicine

## 2021-08-19 ENCOUNTER — Encounter: Payer: Self-pay | Admitting: Family Medicine

## 2021-08-19 VITALS — BP 130/80 | HR 68 | Temp 97.8°F | Ht 59.0 in | Wt 113.0 lb

## 2021-08-19 DIAGNOSIS — N951 Menopausal and female climacteric states: Secondary | ICD-10-CM

## 2021-08-19 DIAGNOSIS — I10 Essential (primary) hypertension: Secondary | ICD-10-CM

## 2021-08-19 MED ORDER — PAROXETINE HCL 20 MG PO TABS
20.0000 mg | ORAL_TABLET | Freq: Every day | ORAL | 3 refills | Status: DC
  Filled 2021-08-19: qty 30, 30d supply, fill #0

## 2021-08-19 NOTE — Progress Notes (Signed)
Subjective:  Patient ID: Raven Buckley, female    DOB: April 05, 1955  Age: 66 y.o. MRN: [id number]  CC: Hypertension   HPI THAI HEMRICK is a 66 y.o. year old female with a history of hypertension, tobacco abuse here for chronic disease management.  Interval History: She endorses adherence with her antihypertensive and statin.  Also remains on bupropion for smoking cessation but continues to smoke. She complains of hot flashes which occur on most nights of the week and would like something for this. Denies additional concerns.  Past Medical History:  Diagnosis Date   Hypertension     History reviewed. No pertinent surgical history.  History reviewed. No pertinent family history.  Social History   Socioeconomic History   Marital status: Single    Spouse name: Not on file   Number of children: Not on file   Years of education: Not on file   Highest education level: Not on file  Occupational History   Not on file  Tobacco Use   Smoking status: Every Day    Packs/day: 0.50    Types: Cigarettes   Smokeless tobacco: Never  Substance and Sexual Activity   Alcohol use: No   Drug use: Yes    Types: Marijuana   Sexual activity: Not on file  Other Topics Concern   Not on file  Social History Narrative   Not on file   Social Determinants of Health   Financial Resource Strain: Not on file  Food Insecurity: Not on file  Transportation Needs: Not on file  Physical Activity: Not on file  Stress: Not on file  Social Connections: Not on file    Allergies  Allergen Reactions   Penicillins Other (See Comments)     pt stated: "I don't know it just bothers me but I did not have a rash or swelling"      Outpatient Medications Prior to Visit  Medication Sig Dispense Refill   amLODipine (NORVASC) 10 MG tablet TAKE 1 TABLET (10 MG TOTAL) BY MOUTH DAILY. 30 tablet 6   atorvastatin (LIPITOR) 20 MG tablet Take 1 tablet (20 mg total) by mouth daily. 30 tablet 3   buPROPion  (WELLBUTRIN SR) 150 MG 12 hr tablet Take 1 tablet (150 mg total) by mouth 2 (two) times daily. For smoking cessation 60 tablet 3   carvedilol (COREG) 25 MG tablet TAKE 1 TABLET (25 MG TOTAL) BY MOUTH 2 (TWO) TIMES DAILY WITH A MEAL. 180 tablet 1   cetirizine (ZYRTEC) 10 MG tablet Take 1 tablet (10 mg total) by mouth daily. 30 tablet 1   diclofenac Sodium (VOLTAREN) 1 % GEL Apply 4 g topically 4 (four) times daily. 100 g 1   lisinopril-hydrochlorothiazide (ZESTORETIC) 20-12.5 MG tablet Take 2 tablets by mouth daily. 180 tablet 1   nicotine (NICODERM CQ - DOSED IN MG/24 HOURS) 14 mg/24hr patch Place 1 patch (14 mg total) onto the skin daily. 28 patch 0   polyethylene glycol powder (GLYCOLAX/MIRALAX) 17 GM/SCOOP powder Take 17 g by mouth daily. 3350 g 1   No facility-administered medications prior to visit.     ROS Review of Systems  Constitutional:  Negative for activity change and appetite change.  HENT:  Negative for sinus pressure and sore throat.   Respiratory:  Negative for chest tightness, shortness of breath and wheezing.   Cardiovascular:  Negative for chest pain and palpitations.  Gastrointestinal:  Negative for abdominal distention, abdominal pain and constipation.  Genitourinary: Negative.   Musculoskeletal:  Negative.   Psychiatric/Behavioral:  Negative for behavioral problems and dysphoric mood.     Objective:  BP 130/80   Pulse 68   Temp 97.8 F (36.6 C) (Oral)   Ht 4\' 11"  (1.499 m)   Wt 113 lb (51.3 kg)   SpO2 100%   BMI 22.82 kg/m      08/19/2021    8:56 AM 08/19/2021    8:42 AM 05/06/2021    8:38 AM  BP/Weight  Systolic BP 130 158 162  Diastolic BP 80 74 84  Wt. (Lbs)  113 109.6  BMI  22.82 kg/m2 22.14 kg/m2      Physical Exam Constitutional:      Appearance: She is well-developed.  Cardiovascular:     Rate and Rhythm: Normal rate.     Heart sounds: Normal heart sounds. No murmur heard. Pulmonary:     Effort: Pulmonary effort is normal.     Breath  sounds: Normal breath sounds. No wheezing or rales.  Chest:     Chest wall: No tenderness.  Abdominal:     General: Bowel sounds are normal. There is no distension.     Palpations: Abdomen is soft. There is no mass.     Tenderness: There is no abdominal tenderness.  Musculoskeletal:        General: Normal range of motion.     Right lower leg: No edema.     Left lower leg: No edema.  Neurological:     Mental Status: She is alert and oriented to person, place, and time.  Psychiatric:        Mood and Affect: Mood normal.        Latest Ref Rng & Units 05/06/2021    9:10 AM 10/28/2020    9:08 AM 08/12/2020    8:34 AM  CMP  Glucose 70 - 99 mg/dL 10/13/2020  95  96   BUN 8 - 27 mg/dL 7  8  8    Creatinine 0.57 - 1.00 mg/dL 132   4.40   Sodium 134 - 144 mmol/L 140  138  141   Potassium 3.5 - 5.2 mmol/L 4.1  3.8  3.8   Chloride 96 - 106 mmol/L 103  97  100   CO2 20 - 29 mmol/L 24  28  25    Calcium 8.7 - 10.3 mg/dL 9.5  9.4  9.5   Total Protein 6.0 - 8.5 g/dL 6.7     Total Bilirubin 0.0 - 1.2 mg/dL 0.2     Alkaline Phos 44 - 121 IU/L 98     AST 0 - 40 IU/L 17     ALT 0 - 32 IU/L 9       Lipid Panel     Component Value Date/Time   CHOL 167 05/06/2021 0910   TRIG 61 05/06/2021 0910   HDL 45 05/06/2021 0910   CHOLHDL 4.3 03/18/2019 1857   VLDL 12 03/18/2019 1857   LDLCALC 110 (H) 05/06/2021 0910    CBC    Component Value Date/Time   WBC 4.3 05/06/2021 0910   WBC 5.4 03/19/2019 0503   RBC 4.09 05/06/2021 0910   RBC 4.17 03/19/2019 0503   HGB 11.9 05/06/2021 0910   HCT 36.2 05/06/2021 0910   PLT 344 05/06/2021 0910   MCV 89 05/06/2021 0910   MCH 29.1 05/06/2021 0910   MCH 29.7 03/19/2019 0503   MCHC 32.9 05/06/2021 0910   MCHC 31.6 03/19/2019 0503   RDW 13.1 05/06/2021 0910  LYMPHSABS 2.4 05/06/2021 0910   MONOABS 0.5 01/07/2016 1716   EOSABS 0.1 05/06/2021 0910   BASOSABS 0.0 05/06/2021 0910    Lab Results  Component Value Date   HGBA1C 6.0 (H) 05/06/2021     Assessment & Plan:  1. Essential hypertension Controlled Continue current regimen Counseled on blood pressure goal of less than 130/80, low-sodium, DASH diet, medication compliance, 150 minutes of moderate intensity exercise per week. Discussed medication compliance, adverse effects.  2. Vasomotor symptoms due to menopause Counseled on pathophysiology of vasomotor symptoms of menopause Trial of nonhormonal therapy - PARoxetine (PAXIL) 20 MG tablet; Take 1 tablet (20 mg total) by mouth daily.  Dispense: 30 tablet; Refill: 3   Health Care Maintenance: referred for colonoscopy but the hold up is that she needs to get someone to go with her. She is up-to-date on lung cancer screening Meds ordered this encounter  Medications   PARoxetine (PAXIL) 20 MG tablet    Sig: Take 1 tablet (20 mg total) by mouth daily.    Dispense:  30 tablet    Refill:  3    Follow-up: Return in about 3 months (around 11/19/2021) for Chronic medical conditions.       Hoy Register, MD, FAAFP. [facility] [place], [place] [phone]   08/19/2021, 9:52 AM

## 2021-08-19 NOTE — Patient Instructions (Signed)
Menopause Menopause is the normal time of a woman's life when menstrual periods stop completely. It marks the natural end to a woman's ability to become pregnant. It can be defined as the absence of a menstrual period for 12 months without another medical cause. The transition to menopause (perimenopause) most often happens between the ages of 45 and 55, and can last for many years. During perimenopause, hormone levels change in your body, which can cause symptoms and affect your health. Menopause may increase your risk for: Weakened bones (osteoporosis), which causes fractures. Depression. Hardening and narrowing of the arteries (atherosclerosis), which can cause heart attacks and strokes. What are the causes? This condition is usually caused by a natural change in hormone levels that happens as you get older. The condition may also be caused by changes that are not natural, including: Surgery to remove both ovaries (surgical menopause). Side effects from some medicines, such as chemotherapy used to treat cancer (chemical menopause). What increases the risk? This condition is more likely to start at an earlier age if you have certain medical conditions or have undergone treatments, including: A tumor of the pituitary gland in the brain. A disease that affects the ovaries and hormones. Certain cancer treatments, such as chemotherapy or hormone therapy, or radiation therapy on the pelvis. Heavy smoking and excessive alcohol use. Family history of early menopause. This condition is also more likely to develop earlier in women who are very thin. What are the signs or symptoms? Symptoms of this condition include: Hot flashes. Irregular menstrual periods. Night sweats. Changes in feelings about sex. This could be a decrease in sex drive or an increased discomfort around your sexuality. Vaginal dryness and thinning of the vaginal walls. This may cause painful sex. Dryness of the skin and  development of wrinkles. Headaches. Problems sleeping (insomnia). Mood swings or irritability. Memory problems. Weight gain. Hair growth on the face and chest. Bladder infections or problems with urinating. How is this diagnosed? This condition is diagnosed based on your medical history, a physical exam, your age, your menstrual history, and your symptoms. Hormone tests may also be done. How is this treated? In some cases, no treatment is needed. You and your health care provider should make a decision together about whether treatment is necessary. Treatment will be based on your individual condition and preferences. Treatment for this condition focuses on managing symptoms. Treatment may include: Menopausal hormone therapy (MHT). Medicines to treat specific symptoms or complications. Acupuncture. Vitamin or herbal supplements. Before starting treatment, make sure to let your health care provider know if you have a personal or family history of these conditions: Heart disease. Breast cancer. Blood clots. Diabetes. Osteoporosis. Follow these instructions at home: Lifestyle Do not use any products that contain nicotine or tobacco, such as cigarettes, e-cigarettes, and chewing tobacco. If you need help quitting, ask your health care provider. Get at least 30 minutes of physical activity on 5 or more days each week. Avoid alcoholic and caffeinated beverages, as well as spicy foods. This may help prevent hot flashes. Get 7-8 hours of sleep each night. If you have hot flashes, try: Dressing in layers. Avoiding things that may trigger hot flashes, such as spicy food, warm places, or stress. Taking slow, deep breaths when a hot flash starts. Keeping a fan in your home and office. Find ways to manage stress, such as deep breathing, meditation, or journaling. Consider going to group therapy with other women who are having menopause symptoms. Ask your health care   provider about recommended  group therapy meetings. Eating and drinking  Eat a healthy, balanced diet that contains whole grains, lean protein, low-fat dairy, and plenty of fruits and vegetables. Your health care provider may recommend adding more soy to your diet. Foods that contain soy include tofu, tempeh, and soy milk. Eat plenty of foods that contain calcium and vitamin D for bone health. Items that are rich in calcium include low-fat milk, yogurt, beans, almonds, sardines, broccoli, and kale. Medicines Take over-the-counter and prescription medicines only as told by your health care provider. Talk with your health care provider before starting any herbal supplements. If prescribed, take vitamins and supplements as told by your health care provider. General instructions  Keep track of your menstrual periods, including: When they occur. How heavy they are and how long they last. How much time passes between periods. Keep track of your symptoms, noting when they start, how often you have them, and how long they last. Use vaginal lubricants or moisturizers to help with vaginal dryness and improve comfort during sex. Keep all follow-up visits. This is important. This includes any group therapy or counseling. Contact a health care provider if: You are still having menstrual periods after age 55. You have pain during sex. You have not had a period for 12 months and you develop vaginal bleeding. Get help right away if you have: Severe depression. Excessive vaginal bleeding. Pain when you urinate. A fast or irregular heartbeat (palpitations). Severe headaches. Abdominal pain or severe indigestion. Summary Menopause is a normal time of life when menstrual periods stop completely. It is usually defined as the absence of a menstrual period for 12 months without another medical cause. The transition to menopause (perimenopause) most often happens between the ages of 45 and 55 and can last for several years. Symptoms  can be managed through medicines, lifestyle changes, and complementary therapies such as acupuncture. Eat a balanced diet that is rich in nutrients to promote bone health and heart health and to manage symptoms during menopause. This information is not intended to replace advice given to you by your health care provider. Make sure you discuss any questions you have with your health care provider. Document Revised: 10/20/2019 Document Reviewed: 07/06/2019 Elsevier Patient Education  2023 [employer]  

## 2021-11-13 ENCOUNTER — Ambulatory Visit: Payer: Medicare Other | Attending: Family Medicine | Admitting: Family Medicine

## 2021-11-13 ENCOUNTER — Encounter: Payer: Self-pay | Admitting: Family Medicine

## 2021-11-13 ENCOUNTER — Other Ambulatory Visit: Payer: Self-pay

## 2021-11-13 VITALS — BP 176/80 | HR 72 | Temp 97.9°F | Ht 59.0 in | Wt 114.4 lb

## 2021-11-13 DIAGNOSIS — Z91148 Patient's other noncompliance with medication regimen for other reason: Secondary | ICD-10-CM

## 2021-11-13 DIAGNOSIS — I10 Essential (primary) hypertension: Secondary | ICD-10-CM

## 2021-11-13 NOTE — Progress Notes (Signed)
Subjective:  Patient ID: Raven Buckley, female    DOB: [birth year]  Age: 66 y.o. MRN: [id number]  CC: Hypertension   HPI Raven Buckley is a 66 y.o. year old female with a history of hypertension, tobacco abuse here for chronic disease management.    Interval History: Blood pressure is significantly elevated and she informs me she just took only one of her antihypertensive as she ran out.  Review of her medication list indicates she has refills at the pharmacy. Medication nonadherence has been a major issue. Denies additional concerns today. Past Medical History:  Diagnosis Date   Hypertension     History reviewed. No pertinent surgical history.  History reviewed. No pertinent family history.  Social History   Socioeconomic History   Marital status: Single    Spouse name: Not on file   Number of children: Not on file   Years of education: Not on file   Highest education level: Not on file  Occupational History   Not on file  Tobacco Use   Smoking status: Every Day    Packs/day: 0.50    Types: Cigarettes   Smokeless tobacco: Never  Substance and Sexual Activity   Alcohol use: No   Drug use: Yes    Types: Marijuana   Sexual activity: Not on file  Other Topics Concern   Not on file  Social History Narrative   Not on file   Social Determinants of Health   Financial Resource Strain: Not on file  Food Insecurity: Not on file  Transportation Needs: Not on file  Physical Activity: Not on file  Stress: Not on file  Social Connections: Not on file    Allergies  Allergen Reactions   Penicillins Other (See Comments)     pt stated: "I don't know it just bothers me but I did not have a rash or swelling"      Outpatient Medications Prior to Visit  Medication Sig Dispense Refill   amLODipine (NORVASC) 10 MG tablet TAKE 1 TABLET (10 MG TOTAL) BY MOUTH DAILY. 30 tablet 6   atorvastatin (LIPITOR) 20 MG tablet Take 1 tablet (20 mg total) by mouth daily. 30 tablet 3    buPROPion (WELLBUTRIN SR) 150 MG 12 hr tablet Take 1 tablet (150 mg total) by mouth 2 (two) times daily. For smoking cessation 60 tablet 3   carvedilol (COREG) 25 MG tablet TAKE 1 TABLET (25 MG TOTAL) BY MOUTH 2 (TWO) TIMES DAILY WITH A MEAL. 180 tablet 1   cetirizine (ZYRTEC) 10 MG tablet Take 1 tablet (10 mg total) by mouth daily. 30 tablet 1   diclofenac Sodium (VOLTAREN) 1 % GEL Apply 4 g topically 4 (four) times daily. 100 g 1   lisinopril-hydrochlorothiazide (ZESTORETIC) 20-12.5 MG tablet Take 2 tablets by mouth daily. 180 tablet 1   nicotine (NICODERM CQ - DOSED IN MG/24 HOURS) 14 mg/24hr patch Place 1 patch (14 mg total) onto the skin daily. 28 patch 0   PARoxetine (PAXIL) 20 MG tablet Take 1 tablet (20 mg total) by mouth daily. 30 tablet 3   polyethylene glycol powder (GLYCOLAX/MIRALAX) 17 GM/SCOOP powder Take 17 g by mouth daily. 3350 g 1   No facility-administered medications prior to visit.     ROS Review of Systems  Constitutional:  Negative for activity change and appetite change.  HENT:  Negative for sinus pressure and sore throat.   Respiratory:  Negative for chest tightness, shortness of breath and wheezing.   Cardiovascular:  Negative for chest pain and palpitations.  Gastrointestinal:  Negative for abdominal distention, abdominal pain and constipation.  Genitourinary: Negative.   Musculoskeletal: Negative.   Psychiatric/Behavioral:  Negative for behavioral problems and dysphoric mood.     Objective:  BP (!) 190/80   Pulse 72   Temp 97.9 F (36.6 C) (Oral)   Ht 4\' 11"  (1.499 m)   Wt 114 lb 6.4 oz (51.9 kg)   SpO2 99%   BMI 23.11 kg/m      11/13/2021    9:01 AM 08/19/2021    8:56 AM 08/19/2021    8:42 AM  BP/Weight  Systolic BP [id number]  Diastolic BP 80 80 74  Wt. (Lbs) 114.4  113  BMI 23.11 kg/m2  22.82 kg/m2      Physical Exam Constitutional:      Appearance: She is well-developed.  Cardiovascular:     Rate and Rhythm: Normal rate.      Heart sounds: Normal heart sounds. No murmur heard. Pulmonary:     Effort: Pulmonary effort is normal.     Breath sounds: Normal breath sounds. No wheezing or rales.  Chest:     Chest wall: No tenderness.  Abdominal:     General: Bowel sounds are normal. There is no distension.     Palpations: Abdomen is soft. There is no mass.     Tenderness: There is no abdominal tenderness.  Musculoskeletal:        General: Normal range of motion.     Right lower leg: No edema.     Left lower leg: No edema.  Neurological:     Mental Status: She is alert and oriented to person, place, and time.  Psychiatric:        Mood and Affect: Mood normal.        Latest Ref Rng & Units 05/06/2021    9:10 AM 10/28/2020    9:08 AM 08/12/2020    8:34 AM  CMP  Glucose 70 - 99 mg/dL 103  95  96   BUN 8 - 27 mg/dL 7  8  8    Creatinine 0.57 - 1.00 mg/dL 0.68  0.68  0.64   Sodium 134 - 144 mmol/L 140  138  141   Potassium 3.5 - 5.2 mmol/L 4.1  3.8  3.8   Chloride 96 - 106 mmol/L 103  97  100   CO2 20 - 29 mmol/L 24  28  25    Calcium 8.7 - 10.3 mg/dL 9.5  9.4  9.5   Total Protein 6.0 - 8.5 g/dL 6.7     Total Bilirubin 0.0 - 1.2 mg/dL 0.2     Alkaline Phos 44 - 121 IU/L 98     AST 0 - 40 IU/L 17     ALT 0 - 32 IU/L 9       Lipid Panel     Component Value Date/Time   CHOL 167 05/06/2021 0910   TRIG 61 05/06/2021 0910   HDL 45 05/06/2021 0910   CHOLHDL 4.3 03/18/2019 1857   VLDL 12 03/18/2019 1857   LDLCALC 110 (H) 05/06/2021 0910    CBC    Component Value Date/Time   WBC 4.3 05/06/2021 0910   WBC 5.4 03/19/2019 0503   RBC 4.09 05/06/2021 0910   RBC 4.17 03/19/2019 0503   HGB 11.9 05/06/2021 0910   HCT 36.2 05/06/2021 0910   PLT 344 05/06/2021 0910   MCV 89 05/06/2021 0910   MCH 29.1 05/06/2021 0910  MCH 29.7 03/19/2019 0503   MCHC 32.9 05/06/2021 0910   MCHC 31.6 03/19/2019 0503   RDW 13.1 05/06/2021 0910   LYMPHSABS 2.4 05/06/2021 0910   MONOABS 0.5 01/07/2016 1716   EOSABS 0.1  05/06/2021 0910   BASOSABS 0.0 05/06/2021 0910    Lab Results  Component Value Date   HGBA1C 6.0 (H) 05/06/2021    Assessment & Plan:  1. Essential hypertension Uncontrolled due to medication nonadherence She does have enough refills at the pharmacy and has been advised to pick them up We have discussed complications of uncontrolled hypertension which she has poor insight into her condition Counseled on blood pressure goal of less than 130/80, low-sodium, DASH diet, medication compliance, 150 minutes of moderate intensity exercise per week. Discussed medication compliance, adverse effects. - Basic Metabolic Panel  2. Nonadherence to medication See #1 above    No orders of the defined types were placed in this encounter.   Follow-up: Return in about 3 months (around 02/13/2022) for Blood Pressure follow-up.       Charlott Rakes, MD, FAAFP. [facility], [place]   11/13/2021, 9:29 AM

## 2021-11-13 NOTE — Patient Instructions (Addendum)
Please call to schedule your colonoscopy: Raven Buckley [address] [place], [place] [address]  pH# [phone] 

## 2021-11-18 ENCOUNTER — Other Ambulatory Visit: Payer: Medicare Other

## 2021-12-05 ENCOUNTER — Ambulatory Visit: Payer: Medicare Other | Attending: Family Medicine

## 2021-12-05 DIAGNOSIS — Z0000 Encounter for general adult medical examination without abnormal findings: Secondary | ICD-10-CM | POA: Diagnosis not present

## 2021-12-05 DIAGNOSIS — Z1231 Encounter for screening mammogram for malignant neoplasm of breast: Secondary | ICD-10-CM

## 2021-12-05 NOTE — Patient Instructions (Signed)
Raven Buckley , Thank you for taking time to come for your Medicare Wellness Visit. I appreciate your ongoing commitment to your health goals. Please review the following plan we discussed and let me know if I can assist you in the future.   These are the goals we discussed:  Goals   None     This is a list of the screening recommended for you and due dates:  Health Maintenance  Topic Date Due   Medicare Annual Wellness Visit  Never done   Colon Cancer Screening  Never done   COVID-19 Vaccine (2 - Pfizer series) 05/04/2020   DEXA scan (bone density measurement)  Never done   Pneumonia Vaccine (1 - PCV) 02/04/2022*   Zoster (Shingles) Vaccine (1 of 2) 02/13/2022*   Flu Shot  05/03/2022*   Mammogram  11/14/2022*   Tetanus Vaccine  11/14/2022*   Screening for Lung Cancer  02/26/2022   Hepatitis C Screening: USPSTF Recommendation to screen - Ages 18-79 yo.  Completed   HPV Vaccine  Aged Out  *Topic was postponed. The date shown is not the original due date.   Health Maintenance After Age 64 After age 35, you are at a higher risk for certain long-term diseases and infections as well as injuries from falls. Falls are a major cause of broken bones and head injuries in people who are older than age 64. Getting regular preventive care can help to keep you healthy and well. Preventive care includes getting regular testing and making lifestyle changes as recommended by your health care provider. Talk with your health care provider about: Which screenings and tests you should have. A screening is a test that checks for a disease when you have no symptoms. A diet and exercise plan that is right for you. What should I know about screenings and tests to prevent falls? Screening and testing are the best ways to find a health problem early. Early diagnosis and treatment give you the best chance of managing medical conditions that are common after age 9. Certain conditions and lifestyle choices may make  you more likely to have a fall. Your health care provider may recommend: Regular vision checks. Poor vision and conditions such as cataracts can make you more likely to have a fall. If you wear glasses, make sure to get your prescription updated if your vision changes. Medicine review. Work with your health care provider to regularly review all of the medicines you are taking, including over-the-counter medicines. Ask your health care provider about any side effects that may make you more likely to have a fall. Tell your health care provider if any medicines that you take make you feel dizzy or sleepy. Strength and balance checks. Your health care provider may recommend certain tests to check your strength and balance while standing, walking, or changing positions. Foot health exam. Foot pain and numbness, as well as not wearing proper footwear, can make you more likely to have a fall. Screenings, including: Osteoporosis screening. Osteoporosis is a condition that causes the bones to get weaker and break more easily. Blood pressure screening. Blood pressure changes and medicines to control blood pressure can make you feel dizzy. Depression screening. You may be more likely to have a fall if you have a fear of falling, feel depressed, or feel unable to do activities that you used to do. Alcohol use screening. Using too much alcohol can affect your balance and may make you more likely to have a fall. Follow these  instructions at home: Lifestyle Do not drink alcohol if: Your health care provider tells you not to drink. If you drink alcohol: Limit how much you have to: 0-1 drink a day for women. 0-2 drinks a day for men. Know how much alcohol is in your drink. In the [place] one drink equals one 12 oz bottle of beer (355 mL), one 5 oz glass of wine (148 mL), or one 1 oz glass of hard liquor (44 mL). Do not use any products that contain nicotine or tobacco. These products include cigarettes, chewing  tobacco, and vaping devices, such as e-cigarettes. If you need help quitting, ask your health care provider. Activity  Follow a regular exercise program to stay fit. This will help you maintain your balance. Ask your health care provider what types of exercise are appropriate for you. If you need a cane or walker, use it as recommended by your health care provider. Wear supportive shoes that have nonskid soles. Safety  Remove any tripping hazards, such as rugs, cords, and clutter. Install safety equipment such as grab bars in bathrooms and safety rails on stairs. Keep rooms and walkways well-lit. General instructions Talk with your health care provider about your risks for falling. Tell your health care provider if: You fall. Be sure to tell your health care provider about all falls, even ones that seem minor. You feel dizzy, tiredness (fatigue), or off-balance. Take over-the-counter and prescription medicines only as told by your health care provider. These include supplements. Eat a healthy diet and maintain a healthy weight. A healthy diet includes low-fat dairy products, low-fat (lean) meats, and fiber from whole grains, beans, and lots of fruits and vegetables. Stay current with your vaccines. Schedule regular health, dental, and eye exams. Summary Having a healthy lifestyle and getting preventive care can help to protect your health and wellness after age 64. Screening and testing are the best way to find a health problem early and help you avoid having a fall. Early diagnosis and treatment give you the best chance for managing medical conditions that are more common for people who are older than age 20. Falls are a major cause of broken bones and head injuries in people who are older than age 49. Take precautions to prevent a fall at home. Work with your health care provider to learn what changes you can make to improve your health and wellness and to prevent falls. This information is  not intended to replace advice given to you by your health care provider. Make sure you discuss any questions you have with your health care provider. Document Revised: 06/10/2020 Document Reviewed: 06/10/2020 Elsevier Patient Education  2023 [employer].

## 2021-12-05 NOTE — Progress Notes (Signed)
Subjective:   Raven Buckley is a 66 y.o. female who presents for Medicare Annual (Subsequent) preventive examination.  Review of Systems     I connected with  on Ena Dawley at 9:34 on 12/05/2021 by  telephone and verified that I am speaking with the correct person using two identifiers. I discussed the limitations, risks, security and privacy concerns of performing an evaluation and management service by telephone and the availability of in person appointments. I also discussed with the patient that there may be a patient responsible charge related to this service. The patient expressed understanding and agreed to proceed.   Patient location: Work   My Location: Office Persons on the telephone call: Myself and Patinet     Cardiac Risk Factors include: none     Objective:    There were no vitals filed for this visit. There is no height or weight on file to calculate BMI.     12/05/2021    9:41 AM 03/18/2019    7:18 PM 03/13/2016    2:13 PM 01/16/2016   10:28 AM 01/07/2016    4:58 PM 08/29/2011    5:51 AM  Advanced Directives  Does Patient Have a Medical Advance Directive? No No No No No Patient does not have advance directive;Patient would not like information  Would patient like information on creating a medical advance directive? Yes (ED - Information included in AVS) No - Patient declined  No - Patient declined No - Patient declined   Pre-existing out of facility DNR order (yellow form or pink MOST form)      No    Current Medications (verified) Outpatient Encounter Medications as of 12/05/2021  Medication Sig   amLODipine (NORVASC) 10 MG tablet TAKE 1 TABLET (10 MG TOTAL) BY MOUTH DAILY.   atorvastatin (LIPITOR) 20 MG tablet Take 1 tablet (20 mg total) by mouth daily.   buPROPion (WELLBUTRIN SR) 150 MG 12 hr tablet Take 1 tablet (150 mg total) by mouth 2 (two) times daily. For smoking cessation   carvedilol (COREG) 25 MG tablet TAKE 1 TABLET (25 MG TOTAL) BY MOUTH 2 (TWO)  TIMES DAILY WITH A MEAL.   cetirizine (ZYRTEC) 10 MG tablet Take 1 tablet (10 mg total) by mouth daily.   diclofenac Sodium (VOLTAREN) 1 % GEL Apply 4 g topically 4 (four) times daily.   lisinopril-hydrochlorothiazide (ZESTORETIC) 20-12.5 MG tablet Take 2 tablets by mouth daily.   nicotine (NICODERM CQ - DOSED IN MG/24 HOURS) 14 mg/24hr patch Place 1 patch (14 mg total) onto the skin daily.   PARoxetine (PAXIL) 20 MG tablet Take 1 tablet (20 mg total) by mouth daily.   polyethylene glycol powder (GLYCOLAX/MIRALAX) 17 GM/SCOOP powder Take 17 g by mouth daily.   No facility-administered encounter medications on file as of 12/05/2021.    Allergies (verified) Penicillins   History: Past Medical History:  Diagnosis Date   Hypertension    History reviewed. No pertinent surgical history. History reviewed. No pertinent family history. Social History   Socioeconomic History   Marital status: Single    Spouse name: Not on file   Number of children: Not on file   Years of education: Not on file   Highest education level: Not on file  Occupational History   Not on file  Tobacco Use   Smoking status: Every Day    Packs/day: 0.50    Types: Cigarettes   Smokeless tobacco: Never  Substance and Sexual Activity   Alcohol use: No  Drug use: Yes    Types: Marijuana   Sexual activity: Not on file  Other Topics Concern   Not on file  Social History Narrative   Not on file   Social Determinants of Health   Financial Resource Strain: Low Risk  (12/05/2021)   Overall Financial Resource Strain (CARDIA)    Difficulty of Paying Living Expenses: Not hard at all  Food Insecurity: No Food Insecurity (12/05/2021)   Hunger Vital Sign    Worried About Running Out of Food in the Last Year: Never true    Ran Out of Food in the Last Year: Never true  Transportation Needs: No Transportation Needs (12/05/2021)   PRAPARE - Hydrologist (Medical): No    Lack of  Transportation (Non-Medical): No  Physical Activity: Insufficiently Active (12/05/2021)   Exercise Vital Sign    Days of Exercise per Week: 7 days    Minutes of Exercise per Session: 20 min  Stress: No Stress Concern Present (12/05/2021)   [place]    Feeling of Stress : Not at all  Social Connections: Moderately Isolated (12/05/2021)   Social Connection and Isolation Panel [NHANES]    Frequency of Communication with Friends and Family: Once a week    Frequency of Social Gatherings with Friends and Family: Once a week    Attends Religious Services: More than 4 times per year    Active Member of [employer] or Organizations: Yes    Attends Music therapist: More than 4 times per year    Marital Status: Never married    Tobacco Counseling Ready to quit: Not Answered Counseling given: Not Answered   Clinical Intake:  Pre-visit preparation completed: Yes  Pain : No/denies pain     Diabetes: No  How often do you need to have someone help you when you read instructions, pamphlets, or other written materials from your doctor or pharmacy?: 1 - Never  Diabetic?NO  Interpreter Needed?: No      Activities of Daily Living    12/05/2021    9:41 AM  In your present state of health, do you have any difficulty performing the following activities:  Hearing? 0  Vision? 0  Difficulty concentrating or making decisions? 0  Walking or climbing stairs? 0  Dressing or bathing? 0  Doing errands, shopping? 0  Preparing Food and eating ? N  Using the Toilet? N  In the past six months, have you accidently leaked urine? N  Do you have problems with loss of bowel control? N  Managing your Medications? N  Managing your Finances? N  Housekeeping or managing your Housekeeping? N    Patient Care Team: Charlott Rakes, MD as PCP - General (Family Medicine)  Indicate any recent Medical Services you may have received  from other than [facility] providers in the past year (date may be approximate).     Assessment:   This is a routine wellness examination for [place].  Hearing/Vision screen No results found.  Dietary issues and exercise activities discussed: Current Exercise Habits: Home exercise routine, Type of exercise: walking, Time (Minutes): 20, Frequency (Times/Week): 7, Weekly Exercise (Minutes/Week): 140, Intensity: Mild, Exercise limited by: None identified   Goals Addressed   None    Depression Screen    12/05/2021    9:41 AM 11/13/2021    9:11 AM 08/19/2021    8:48 AM 05/06/2021    8:41 AM 02/04/2021    8:49  AM 10/28/2020    8:44 AM 07/29/2020    8:44 AM  PHQ 2/9 Scores  PHQ - 2 Score 0 0 0 0 0 0 0  PHQ- 9 Score  0 0 0 0 0 0    Fall Risk    12/05/2021    9:41 AM 11/13/2021    9:00 AM 08/19/2021    8:45 AM 05/06/2021    8:40 AM 02/04/2021    8:49 AM  Fall Risk   Falls in the past year? 0 0 0 0 0  Number falls in past yr: 0 0 0 0 0  Injury with Fall? 0 0 0 0 0  Risk for fall due to : No Fall Risks No Fall Risks       FALL RISK PREVENTION PERTAINING TO THE HOME:  Any stairs in or around the home? No  If so, are there any without handrails? No  Home free of loose throw rugs in walkways, pet beds, electrical cords, etc? Yes  Adequate lighting in your home to reduce risk of falls? Yes   ASSISTIVE DEVICES UTILIZED TO PREVENT FALLS:  Life alert? No  Use of a cane, walker or w/c? No  Grab bars in the bathroom? No  Shower chair or bench in shower? No  Elevated toilet seat or a handicapped toilet? No   TIMED UP AND GO:  Was the test performed? No .  Length of time to ambulate 10 feet: 0  sec.   Gait steady and fast without use of assistive device  Cognitive Function:    12/05/2021    9:43 AM  MMSE - Mini Mental State Exam  Orientation to time 5  Orientation to Place 5  Registration 3  Attention/ Calculation 5  Recall 3  Language- name 2 objects 2  Language- repeat 1  Language-  follow 3 step command 3  Language- read & follow direction 1  Write a sentence 1  Copy design 1  Total score 30        12/05/2021    9:42 AM  6CIT Screen  What Year? 0 points  What month? 0 points  What time? 0 points  Count back from 20 0 points  Months in reverse 0 points  Repeat phrase 0 points  Total Score 0 points    Immunizations Immunization History  Administered Date(s) Administered   PFIZER(Purple Top)SARS-COV-2 Vaccination 03/09/2020    TDAP status: Due, Education has been provided regarding the importance of this vaccine. Advised may receive this vaccine at local pharmacy or Health Dept. Aware to provide a copy of the vaccination record if obtained from local pharmacy or Health Dept. Verbalized acceptance and understanding.  Flu Vaccine status: Due, Education has been provided regarding the importance of this vaccine. Advised may receive this vaccine at local pharmacy or Health Dept. Aware to provide a copy of the vaccination record if obtained from local pharmacy or Health Dept. Verbalized acceptance and understanding.  Pneumococcal vaccine status: Due, Education has been provided regarding the importance of this vaccine. Advised may receive this vaccine at local pharmacy or Health Dept. Aware to provide a copy of the vaccination record if obtained from local pharmacy or Health Dept. Verbalized acceptance and understanding.  Covid-19 vaccine status: Declined, Education has been provided regarding the importance of this vaccine but patient still declined. Advised may receive this vaccine at local pharmacy or Health Dept.or vaccine clinic. Aware to provide a copy of the vaccination record if obtained from local pharmacy or  Health Dept. Verbalized acceptance and understanding.  Qualifies for Shingles Vaccine? Yes   Zostavax completed No   Shingrix Completed?: No.    Education has been provided regarding the importance of this vaccine. Patient has been advised to call  insurance company to determine out of pocket expense if they have not yet received this vaccine. Advised may also receive vaccine at local pharmacy or Health Dept. Verbalized acceptance and understanding.  Screening Tests Health Maintenance  Topic Date Due   Medicare Annual Wellness (AWV)  Never done   COLONOSCOPY (Pts 45-21yrs Insurance coverage will need to be confirmed)  Never done   COVID-19 Vaccine (2 - Pfizer series) 05/04/2020   DEXA SCAN  Never done   Pneumonia Vaccine 4+ Years old (1 - PCV) 02/04/2022 (Originally 11/12/1961)   Zoster Vaccines- Shingrix (1 of 2) 02/13/2022 (Originally 11/12/2005)   INFLUENZA VACCINE  05/03/2022 (Originally 09/02/2021)   MAMMOGRAM  11/14/2022 (Originally 11/08/2021)   TETANUS/TDAP  11/14/2022 (Originally 11/13/1974)   Lung Cancer Screening  02/26/2022   Hepatitis C Screening  Completed   HPV VACCINES  Aged Out    Health Maintenance  Health Maintenance Due  Topic Date Due   Medicare Annual Wellness (AWV)  Never done   COLONOSCOPY (Pts 45-58yrs Insurance coverage will need to be confirmed)  Never done   COVID-19 Vaccine (2 - Pfizer series) 05/04/2020   DEXA SCAN  Never done    Colon Cancer screening ordered on 05/06/2021 pt has been provided with phone number to schedule.  Mammogram status: Ordered 12/05/2021. Pt provided with contact info and advised to call to schedule appt.   Bone Density status: Ordered 05/06/2021. Pt provided with contact info and advised to call to schedule appt.  Lung Cancer Screening: (Low Dose CT Chest recommended if Age 24-80 years, 30 pack-year currently smoking OR have quit w/in 15years.) does qualify. Completed 02/26/2021  Lung Cancer Screening Referral: No  Additional Screening:  Hepatitis C Screening: does qualify; Completed 09/28/2019  Vision Screening: Recommended annual ophthalmology exams for early detection of glaucoma and other disorders of the eye. Is the patient up to date with their annual eye exam?  No   Who is the provider or what is the name of the office in which the patient attends annual eye exams? N/A If pt is not established with a provider, would they like to be referred to a provider to establish care? No .   Dental Screening: Recommended annual dental exams for proper oral hygiene  Community Resource Referral / Chronic Care Management: CRR required this visit?  No   CCM required this visit?  No      Plan:     I have personally reviewed and noted the following in the patient's chart:   Medical and social history Use of alcohol, tobacco or illicit drugs  Current medications and supplements including opioid prescriptions. Patient is not currently taking opioid prescriptions. Functional ability and status Nutritional status Physical activity Advanced directives List of other physicians Hospitalizations, surgeries, and ER visits in previous 12 months Vitals Screenings to include cognitive, depression, and falls Referrals and appointments  In addition, I have reviewed and discussed with patient certain preventive protocols, quality metrics, and best practice recommendations. A written personalized care plan for preventive services as well as general preventive health recommendations were provided to patient.     Ronette Deter, CMA   12/05/2021   Nurse Notes: I spent 30 minutes on this telephone encounter AVS mailed to patient

## 2022-02-17 ENCOUNTER — Ambulatory Visit: Payer: Medicare Other | Admitting: Family Medicine

## 2022-03-23 ENCOUNTER — Telehealth: Payer: Self-pay

## 2022-03-23 NOTE — Telephone Encounter (Signed)
Patient attempted to be outreached by Levi Aland, PharmD Candidate on 03/23/22 to discuss hypertension. Left voicemail for patient to return our call at their convenience at [phone].   Levi Aland, PharmD Candidate, Class of [phone]  [place], [place].D. PGY-2 Amb[facility] Pharmacy Resident 03/23/2022 10:58 AM

## 2022-03-26 NOTE — Telephone Encounter (Signed)
Patient attempted to be outreached by Junius Finner, PharmD Candidate on 03/26/2022 to discuss hypertension. Left voicemail for patient to return our call at their convenience at [phone].   [facility] Pharmacy  PharmD Candidate 2024   Maryan Puls, PharmD PGY-1 [facility] Pharmacy Resident

## 2022-03-30 NOTE — Telephone Encounter (Signed)
Patient attempted to be outreached by Levi Aland, PharmD Candidate on 03/30/22 to discuss hypertension. Left voicemail for patient to return our call at their convenience at [phone].   Levi Aland, PharmD Candidate, Class of 2024  [place]. [place], PharmD PGY-1 [facility] Pharmacy Resident

## 2022-06-18 ENCOUNTER — Telehealth: Payer: Self-pay

## 2022-06-18 NOTE — Telephone Encounter (Signed)
   Telephone encounter was:  Unsuccessful.  06/18/2022 Name: Raven Buckley MRN: [id number] DOB: 1955/08/13  Unsuccessful outbound call made today to assist with:   Colon Cancer Screening  Outreach Attempt:  1st Attempt  A HIPAA compliant voice message was left requesting a return call.  Instructed patient to call back    Lenard Forth [facility] Guide, [facility] [phone] [address] [place], [place], [place] [address] Phone: [phone] Email: [email or web address]

## 2022-06-19 ENCOUNTER — Telehealth: Payer: Self-pay

## 2022-06-19 NOTE — Telephone Encounter (Signed)
   Telephone encounter was:  Unsuccessful.  06/19/2022 Name: Raven Buckley MRN: [id number] DOB: 06-17-1955  Unsuccessful outbound call made today to assist with:   Colon Cancer Screening  Outreach Attempt:  2nd Attempt  A HIPAA compliant voice message was left requesting a return call.  Instructed patient to call back.    Lenard Forth [facility] Guide, [facility] [phone] [address] [place], [place], [place] [address] Phone: [phone] Email: [email or web address]

## 2022-06-22 ENCOUNTER — Telehealth: Payer: Self-pay

## 2022-06-22 NOTE — Telephone Encounter (Signed)
   Telephone encounter was:  Unsuccessful.  06/22/2022 Name: Raven Buckley MRN: [id number] DOB: 1955-09-01  Unsuccessful outbound call made today to assist with:   Colorectal Cancer Screening  Outreach Attempt:  3rd Attempt.  Referral closed unable to contact patient.  A HIPAA compliant voice message was left requesting a return call.  Instructed patient to call back     Lenard Forth [facility] Guide, [facility] [phone] [address] [place], [place], [place] [address] Phone: [phone] Email: [email or web address]

## 2022-07-22 IMAGING — CT CT CHEST LUNG CANCER SCREENING LOW DOSE W/O CM
1 series · 10 of 10 positions shown, 13 images · non-contrast
Comparison: None.

CLINICAL DATA: Current smoker, 20 pack-year history.



[ct lung segmentation data · axial · 0.55mm/px · z∈[-623,-623]mm · 10 of 284 frames shown]
[frame 1/284  mediastinal]
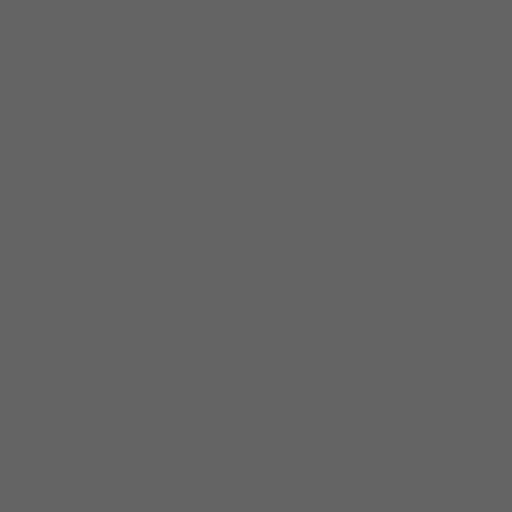
[frame 1/284  lung]
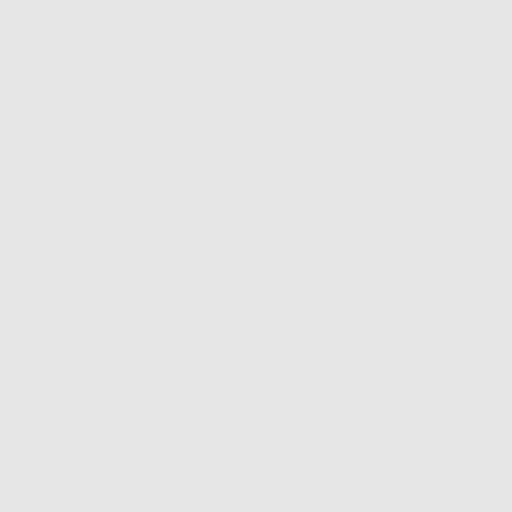
[frame 32/284  lung]
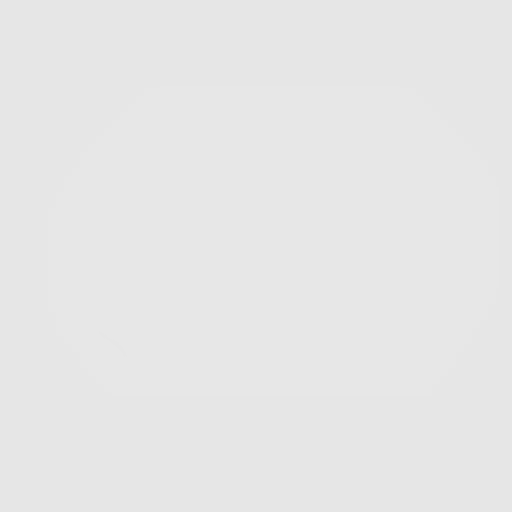
[frame 63/284  lung]
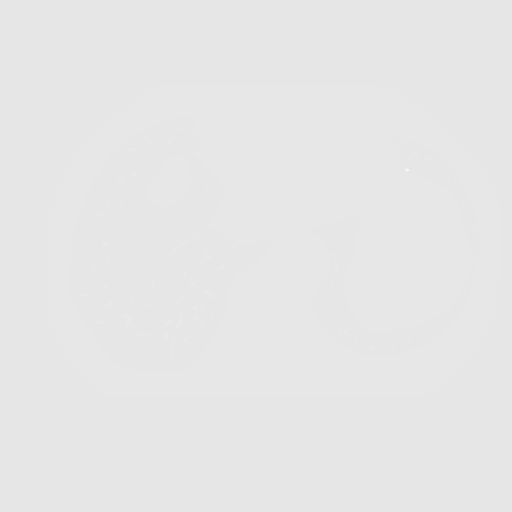
[frame 95/284  lung]
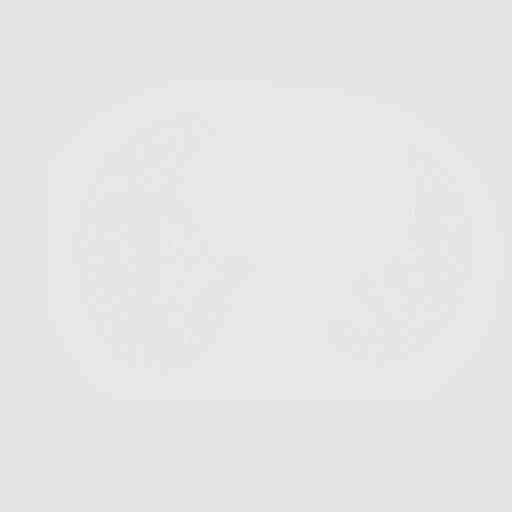
[frame 126/284  mediastinal]
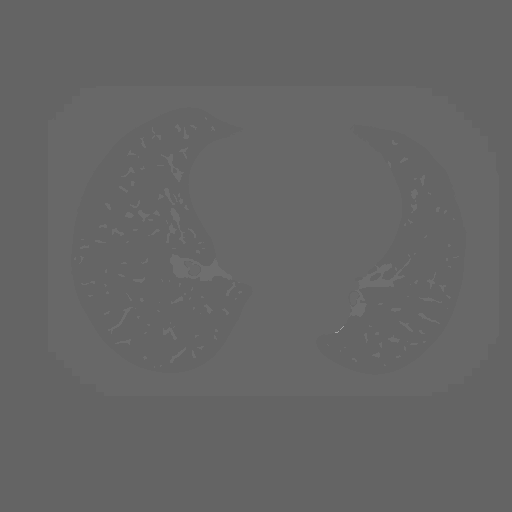
[frame 126/284  lung]
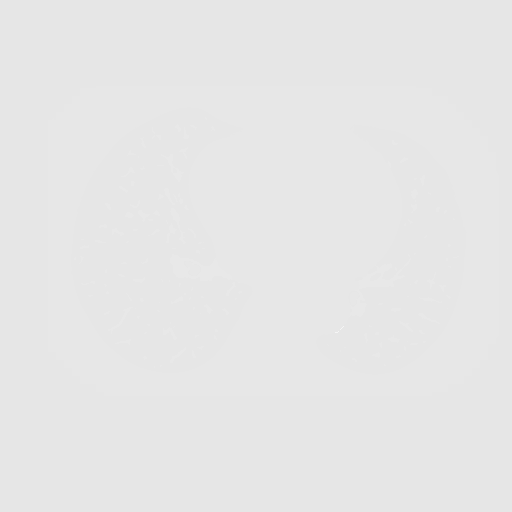
[frame 158/284  lung]
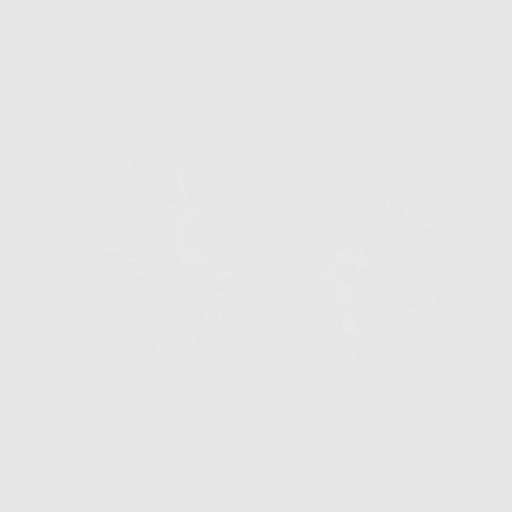
[frame 189/284  lung]
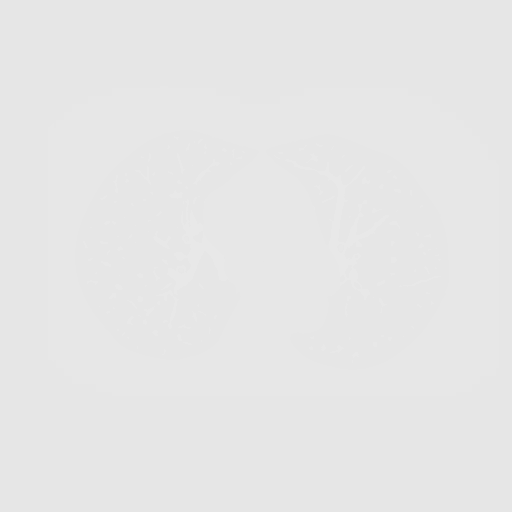
[frame 221/284  lung]
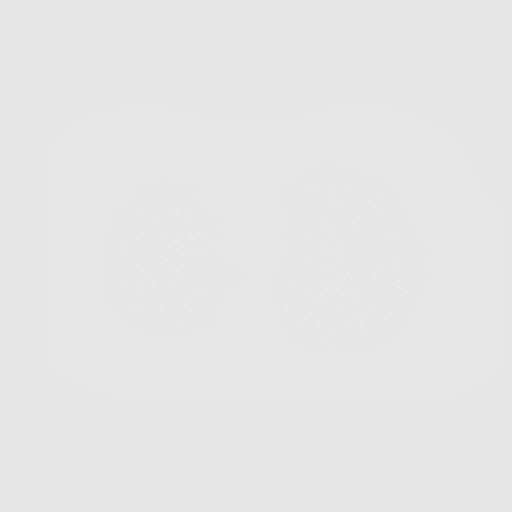
[frame 252/284  mediastinal]
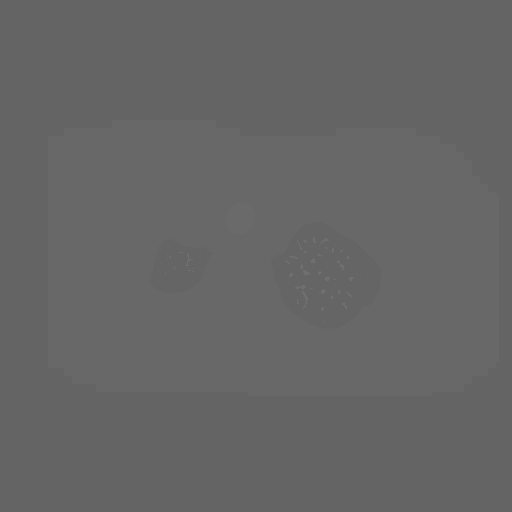
[frame 252/284  lung]
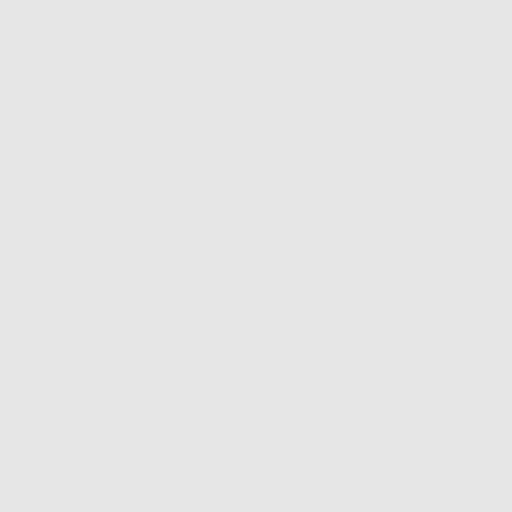
[frame 284/284  lung]
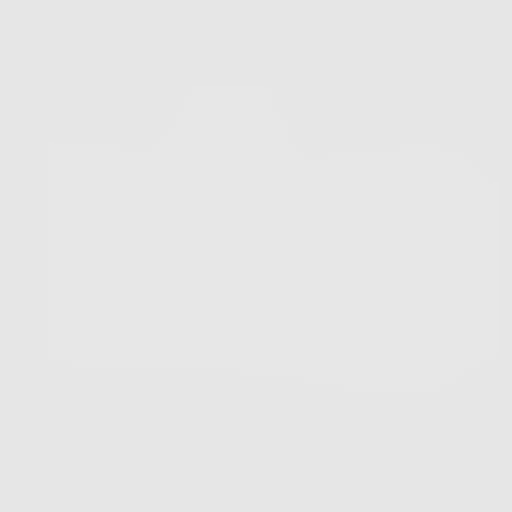

[10 of 10 positions shown; findings below may reference images not displayed]

FINDINGS: Cardiovascular: Atherosclerotic calcification of the aorta. Pulmonic
trunk is enlarged. Heart is at the upper limits of normal in size.
No pericardial effusion.

Mediastinum/Nodes: No pathologically enlarged mediastinal or
axillary lymph nodes. Hilar regions are difficult to definitively
evaluate without IV contrast. Esophagus is grossly unremarkable.

Lungs/Pleura: Mild centrilobular emphysema. Smoking related
respiratory bronchiolitis. No suspicious pulmonary nodules. No
pleural fluid. Airway is unremarkable.

Upper Abdomen: Low-attenuation lesions in the liver measure up to 10
mm, too small to characterize. Visualized portions of the liver,
adrenal glands, kidneys, spleen, pancreas, stomach and bowel are
grossly unremarkable.

Musculoskeletal: No worrisome lytic or sclerotic lesions.
IMPRESSION: 1. Lung-RADS 1, negative. Continue annual screening with low-dose
chest CT without contrast in 12 months.
2.  Aortic atherosclerosis (5LZF3-H7X.X).
3. Enlarged pulmonic trunk, indicative of pulmonary arterial
hypertension.
4.  Emphysema (5LZF3-PI1.E).

## 2022-10-29 ENCOUNTER — Other Ambulatory Visit: Payer: Self-pay

## 2022-10-29 ENCOUNTER — Encounter (HOSPITAL_COMMUNITY): Payer: Self-pay

## 2022-10-29 ENCOUNTER — Emergency Department (HOSPITAL_COMMUNITY)
Admission: EM | Admit: 2022-10-29 | Discharge: 2022-10-29 | Disposition: A | Discharge status: Home / Self Care | Payer: Medicare Other | Attending: Emergency Medicine | Admitting: Emergency Medicine

## 2022-10-29 DIAGNOSIS — M542 Cervicalgia: Secondary | ICD-10-CM | POA: Diagnosis not present

## 2022-10-29 DIAGNOSIS — S46812A Strain of other muscles, fascia and tendons at shoulder and upper arm level, left arm, initial encounter: Secondary | ICD-10-CM | POA: Insufficient documentation

## 2022-10-29 DIAGNOSIS — Z79899 Other long term (current) drug therapy: Secondary | ICD-10-CM | POA: Diagnosis not present

## 2022-10-29 DIAGNOSIS — S4992XA Unspecified injury of left shoulder and upper arm, initial encounter: Secondary | ICD-10-CM | POA: Diagnosis present

## 2022-10-29 DIAGNOSIS — W502XXA Accidental twist by another person, initial encounter: Secondary | ICD-10-CM | POA: Diagnosis not present

## 2022-10-29 MED ORDER — KETOROLAC TROMETHAMINE 15 MG/ML IJ SOLN
15.0000 mg | Freq: Once | INTRAMUSCULAR | Status: AC
  Administered 2022-10-29: 15 mg via INTRAMUSCULAR
  Filled 2022-10-29: qty 1

## 2022-10-29 MED ORDER — DICLOFENAC SODIUM 1 % EX GEL
4.0000 g | Freq: Four times a day (QID) | CUTANEOUS | 0 refills | Status: DC
  Filled 2022-10-29: qty 100, 6d supply, fill #0

## 2022-10-29 NOTE — ED Provider Notes (Signed)
EMERGENCY DEPARTMENT AT [facility] Provider Note   CSN: [id number] Arrival date & time: 10/29/22  0915     History  Chief Complaint  Patient presents with   Neck Pain    Raven Buckley is a 67 y.o. female.  67 yo F with a chief complaints of left-sided neck pain.  She tells me that [place] and was riding back on the bus and realized that her neck started to hurt.  Hurts on the left side worse with twisting and palpation.  Denies numbness or weakness.  Denies trauma.   Neck Pain      Home Medications Prior to Admission medications   Medication Sig Start Date End Date Taking? Authorizing Provider  diclofenac Sodium (VOLTAREN) 1 % GEL Apply 4 g topically 4 (four) times daily. 10/29/22  Yes Melene Plan, DO  amLODipine (NORVASC) 10 MG tablet TAKE 1 TABLET (10 MG TOTAL) BY MOUTH DAILY. 05/15/21 05/15/22  Hoy Register, MD  atorvastatin (LIPITOR) 20 MG tablet Take 1 tablet (20 mg total) by mouth daily. 03/03/21   Hoy Register, MD  buPROPion (WELLBUTRIN SR) 150 MG 12 hr tablet Take 1 tablet (150 mg total) by mouth 2 (two) times daily. For smoking cessation 10/28/20   Hoy Register, MD  carvedilol (COREG) 25 MG tablet TAKE 1 TABLET (25 MG TOTAL) BY MOUTH 2 (TWO) TIMES DAILY WITH A MEAL. 05/15/21 05/15/22  Hoy Register, MD  cetirizine (ZYRTEC) 10 MG tablet Take 1 tablet (10 mg total) by mouth daily. 05/06/21   Hoy Register, MD  lisinopril-hydrochlorothiazide (ZESTORETIC) 20-12.5 MG tablet Take 2 tablets by mouth daily. 05/15/21   Hoy Register, MD  nicotine (NICODERM CQ - DOSED IN MG/24 HOURS) 14 mg/24hr patch Place 1 patch (14 mg total) onto the skin daily. 03/20/19   Jerald Kief, MD  PARoxetine (PAXIL) 20 MG tablet Take 1 tablet (20 mg total) by mouth daily. 08/19/21   Hoy Register, MD  polyethylene glycol powder (GLYCOLAX/MIRALAX) 17 GM/SCOOP powder Take 17 g by mouth daily. 02/04/21   Hoy Register, MD       Allergies    Penicillins    Review of Systems   Review of Systems  Musculoskeletal:  Positive for neck pain.    Physical Exam Updated Vital Signs BP (!) 214/99   Pulse 65   Temp 97.8 F (36.6 C) (Oral)   Resp 18   Ht 4\' 11"  (1.499 m)   Wt 51.9 kg   SpO2 100%   BMI 23.11 kg/m  Physical Exam Vitals and nursing note reviewed.  Constitutional:      General: She is not in acute distress.    Appearance: She is well-developed. She is not diaphoretic.  HENT:     Head: Normocephalic and atraumatic.  Eyes:     Pupils: Pupils are equal, round, and reactive to light.  Cardiovascular:     Rate and Rhythm: Normal rate and regular rhythm.     Heart sounds: No murmur heard.    No friction rub. No gallop.  Pulmonary:     Effort: Pulmonary effort is normal.     Breath sounds: No wheezing or rales.  Abdominal:     General: There is no distension.     Palpations: Abdomen is soft.     Tenderness: There is no abdominal tenderness.  Musculoskeletal:        General: Tenderness present.     Cervical back: Normal  range of motion and neck supple.     Comments: Pain along the left trapezius muscle belly.  No obvious midline spinal tenderness offs or deformities able to rotate her head without obvious midline discomfort.  Pulse motor and sensation intact to the left upper extremity.  Skin:    General: Skin is warm and dry.  Neurological:     Mental Status: She is alert and oriented to person, place, and time.  Psychiatric:        Behavior: Behavior normal.     ED Results / Procedures / Treatments   Labs (all labs ordered are listed, but only abnormal results are displayed) Labs Reviewed - No data to display  EKG None  Radiology No results found.  Procedures Procedures    Medications Ordered in ED Medications  ketorolac (TORADOL) 15 MG/ML injection 15 mg (has no administration in time range)    ED Course/ Medical Decision Making/ A&P                                  Medical Decision Making Risk Prescription drug management.   67 yo F with a chief complaint of left-sided neck pain.  It sounds like the patient has torticollis she was trying to ride on a bus from the beach back to here and had developed some discomfort.  Worse with movement palpation.  Will treat supportively.  Have her follow-up with her family doctor in the office.  9:43 AM:  I have discussed the diagnosis/risks/treatment options with the patient.  Evaluation and diagnostic testing in the emergency department does not suggest an emergent condition requiring admission or immediate intervention beyond what has been performed at this time.  They will follow up with PCP. We also discussed returning to the ED immediately if new or worsening sx occur. We discussed the sx which are most concerning (e.g., sudden worsening pain, fever, inability to tolerate by mouth, numbness, weakness) that necessitate immediate return. Medications administered to the patient during their visit and any new prescriptions provided to the patient are listed below.  Medications given during this visit Medications  ketorolac (TORADOL) 15 MG/ML injection 15 mg (has no administration in time range)     The patient appears reasonably screen and/or stabilized for discharge and I doubt any other medical condition or other [facility] requiring further screening, evaluation, or treatment in the ED at this time prior to discharge.          Final Clinical Impression(s) / ED Diagnoses Final diagnoses:  Trapezius muscle strain, left, initial encounter    Rx / DC Orders ED Discharge Orders          Ordered    diclofenac Sodium (VOLTAREN) 1 % GEL  4 times daily        10/29/22 0939              Melene Plan, DO 10/29/22 [phone]

## 2022-10-29 NOTE — ED Notes (Signed)
Pt states hasn't taken bp meds today

## 2022-10-29 NOTE — Discharge Instructions (Signed)
Use the gel as prescribed Also take tylenol 1000mg (2 extra strength) four times a day.

## 2022-10-29 NOTE — ED Triage Notes (Signed)
Pt states woke up with neck pain. Pt denies injury. Pt denies numbness or tingling.

## 2022-11-05 ENCOUNTER — Other Ambulatory Visit: Payer: Self-pay

## 2022-11-06 ENCOUNTER — Other Ambulatory Visit: Payer: Self-pay

## 2022-11-17 ENCOUNTER — Other Ambulatory Visit: Payer: Self-pay

## 2023-01-04 ENCOUNTER — Telehealth: Payer: Self-pay | Admitting: *Deleted

## 2023-01-04 NOTE — Telephone Encounter (Signed)
Chart reviewed and [employer] verification completed.   Chart review highlights and potential barriers to colorectal cancer screening (CRC) screening: Patient has agreed to colonoscopy in the past (08/19/21) but did not obtain screening due to limited caregiver support (states she did not have anyone to go with her). Patient 's most recent GI referral was closed on 06/19/21 because patient did not return call to [facility] to schedule.  Patient also given [facility] contact number in her most recent patient instructions from Dr. Alvis Lemmings on 11/13/21 and was advised to call to schedule colonoscopy.  Cologuard was ordered on 10/08/20 and test was not completed.  Patient has also had unsuccessful patient outreach calls from [employer] [facility]) Care Guide on 06/22/22, 06/19/22, and 06/18/22 during CRC Screening outreach.   Telephone call to patient, no answer, left voicemail message, advised calling from Dr. Baxter Flattery office, and requested call back at my direct extension [phone]).   Maayan Jenning H. Docia Furl, Charity fundraiser, [facility] [phone]

## 2023-01-26 ENCOUNTER — Telehealth: Payer: Self-pay | Admitting: *Deleted

## 2023-01-26 NOTE — Telephone Encounter (Signed)
 Chart reviewed and [employer] verification completed.   Chart review highlights and potential barriers to colorectal cancer screening (CRC) screening: Patient has agreed to colonoscopy in the past (08/19/21) but did not obtain screening due to limited caregiver support (states she did not have anyone to go with her). Patient 's most recent GI referral was closed on 06/19/21 because patient did not return call to [facility] to schedule.  Patient also given [facility] contact number in her most recent patient instructions from Dr. Alvis Lemmings on 11/13/21 and was advised to call to schedule colonoscopy.  Cologuard was ordered on 10/08/20 and test was not completed.  Patient has also had unsuccessful patient outreach calls from [employer] [facility]) Care Guide on 06/22/22, 06/19/22, and 06/18/22 during CRC Screening outreach.   Telephone call to patient, no answer, left voicemail message, advised calling from Dr. Baxter Flattery office, and requested call back at my direct extension [phone]).   Maayan Jenning H. Docia Furl, Charity fundraiser, [facility] [phone]

## 2023-05-30 ENCOUNTER — Emergency Department (HOSPITAL_COMMUNITY)

## 2023-05-30 ENCOUNTER — Observation Stay (HOSPITAL_COMMUNITY)
Admission: EM | Admit: 2023-05-30 | Discharge: 2023-06-01 | Disposition: A | Discharge status: Home / Self Care | Attending: Internal Medicine | Admitting: Internal Medicine

## 2023-05-30 ENCOUNTER — Other Ambulatory Visit: Payer: Self-pay

## 2023-05-30 DIAGNOSIS — I10 Essential (primary) hypertension: Secondary | ICD-10-CM | POA: Diagnosis present

## 2023-05-30 DIAGNOSIS — E785 Hyperlipidemia, unspecified: Secondary | ICD-10-CM | POA: Insufficient documentation

## 2023-05-30 DIAGNOSIS — I4581 Long QT syndrome: Secondary | ICD-10-CM | POA: Insufficient documentation

## 2023-05-30 DIAGNOSIS — G9340 Encephalopathy, unspecified: Secondary | ICD-10-CM | POA: Diagnosis not present

## 2023-05-30 DIAGNOSIS — G9341 Metabolic encephalopathy: Secondary | ICD-10-CM | POA: Diagnosis not present

## 2023-05-30 DIAGNOSIS — Z79899 Other long term (current) drug therapy: Secondary | ICD-10-CM | POA: Insufficient documentation

## 2023-05-30 DIAGNOSIS — R2681 Unsteadiness on feet: Secondary | ICD-10-CM | POA: Insufficient documentation

## 2023-05-30 DIAGNOSIS — E876 Hypokalemia: Secondary | ICD-10-CM | POA: Diagnosis present

## 2023-05-30 DIAGNOSIS — Z720 Tobacco use: Secondary | ICD-10-CM

## 2023-05-30 DIAGNOSIS — R9431 Abnormal electrocardiogram [ECG] [EKG]: Secondary | ICD-10-CM | POA: Diagnosis present

## 2023-05-30 DIAGNOSIS — F17210 Nicotine dependence, cigarettes, uncomplicated: Secondary | ICD-10-CM | POA: Diagnosis not present

## 2023-05-30 DIAGNOSIS — R4182 Altered mental status, unspecified: Secondary | ICD-10-CM | POA: Diagnosis present

## 2023-05-30 LAB — URINALYSIS, W/ REFLEX TO CULTURE (INFECTION SUSPECTED)
Bilirubin Urine: NEGATIVE
Glucose, UA: NEGATIVE mg/dL
Ketones, ur: 5 mg/dL — AB
Nitrite: NEGATIVE
Protein, ur: 100 mg/dL — AB
Specific Gravity, Urine: 1.02 (ref 1.005–1.030)
pH: 5 (ref 5.0–8.0)

## 2023-05-30 LAB — CBC
HCT: 42.1 % (ref 36.0–46.0)
Hemoglobin: 13.9 g/dL (ref 12.0–15.0)
MCH: 30 pg (ref 26.0–34.0)
MCHC: 33 g/dL (ref 30.0–36.0)
MCV: 90.7 fL (ref 80.0–100.0)
Platelets: 392 10*3/uL (ref 150–400)
RBC: 4.64 MIL/uL (ref 3.87–5.11)
RDW: 13.3 % (ref 11.5–15.5)
WBC: 10.5 10*3/uL (ref 4.0–10.5)
nRBC: 0 % (ref 0.0–0.2)

## 2023-05-30 LAB — COMPREHENSIVE METABOLIC PANEL WITH GFR
ALT: 14 U/L (ref 0–44)
AST: 27 U/L (ref 15–41)
Albumin: 4.4 g/dL (ref 3.5–5.0)
Alkaline Phosphatase: 100 U/L (ref 38–126)
Anion gap: 14 (ref 5–15)
BUN: 12 mg/dL (ref 8–23)
CO2: 25 mmol/L (ref 22–32)
Calcium: 9.6 mg/dL (ref 8.9–10.3)
Chloride: 96 mmol/L — ABNORMAL LOW (ref 98–111)
Creatinine, Ser: 0.83 mg/dL (ref 0.44–1.00)
GFR, Estimated: >60 mL/min (ref >60)
Glucose, Bld: 150 mg/dL — ABNORMAL HIGH (ref 70–99)
Potassium: 3.3 mmol/L — ABNORMAL LOW (ref 3.5–5.1)
Sodium: 135 mmol/L (ref 135–145)
Total Bilirubin: 1.1 mg/dL (ref 0.0–1.2)
Total Protein: 8.7 g/dL — ABNORMAL HIGH (ref 6.5–8.1)

## 2023-05-30 LAB — RAPID URINE DRUG SCREEN, HOSP PERFORMED
Amphetamines: NOT DETECTED
Barbiturates: NOT DETECTED
Benzodiazepines: NOT DETECTED
Cocaine: NOT DETECTED
Opiates: NOT DETECTED
Tetrahydrocannabinol: POSITIVE — AB

## 2023-05-30 LAB — ACETAMINOPHEN LEVEL: Acetaminophen (Tylenol), Serum: <10 ug/mL — ABNORMAL LOW (ref 10–30)

## 2023-05-30 LAB — TSH: TSH: 0.872 u[IU]/mL (ref 0.350–4.500)

## 2023-05-30 LAB — SALICYLATE LEVEL: Salicylate Lvl: <7 mg/dL — ABNORMAL LOW (ref 7.0–30.0)

## 2023-05-30 LAB — MAGNESIUM: Magnesium: 1.9 mg/dL (ref 1.7–2.4)

## 2023-05-30 LAB — ETHANOL: Alcohol, Ethyl (B): <15 mg/dL (ref <15)

## 2023-05-30 LAB — AMMONIA: Ammonia: 21 umol/L (ref 9–35)

## 2023-05-30 LAB — CK: Total CK: 192 U/L (ref 38–234)

## 2023-05-30 MED ORDER — ACETAMINOPHEN 650 MG RE SUPP: 650.0000 mg | Freq: Four times a day (QID) | RECTAL | Status: DC | PRN

## 2023-05-30 MED ORDER — TRIMETHOBENZAMIDE HCL 100 MG/ML IM SOLN: 200.0000 mg | Freq: Four times a day (QID) | INTRAMUSCULAR | Status: DC | PRN

## 2023-05-30 MED ORDER — SENNOSIDES-DOCUSATE SODIUM 8.6-50 MG PO TABS: 1.0000 | ORAL_TABLET | Freq: Every evening | ORAL | Status: DC | PRN

## 2023-05-30 MED ORDER — SODIUM CHLORIDE 0.9% FLUSH
3.0000 mL | Freq: Two times a day (BID) | INTRAVENOUS | Status: DC
  Administered 2023-05-31 – 2023-06-01 (×2): 3 mL via INTRAVENOUS

## 2023-05-30 MED ORDER — SODIUM CHLORIDE 0.9 % IV BOLUS
1000.0000 mL | Freq: Once | INTRAVENOUS | Status: AC
  Administered 2023-05-30: 1000 mL via INTRAVENOUS

## 2023-05-30 MED ORDER — POTASSIUM CHLORIDE 20 MEQ PO PACK
60.0000 meq | PACK | Freq: Once | ORAL | Status: AC
  Administered 2023-05-30: 60 meq via ORAL
  Filled 2023-05-30: qty 3

## 2023-05-30 MED ORDER — ENOXAPARIN SODIUM 40 MG/0.4ML IJ SOSY
40.0000 mg | PREFILLED_SYRINGE | INTRAMUSCULAR | Status: DC
  Administered 2023-05-31 – 2023-06-01 (×2): 40 mg via SUBCUTANEOUS
  Filled 2023-05-30 (×2): qty 0.4

## 2023-05-30 MED ORDER — HYDRALAZINE HCL 20 MG/ML IJ SOLN: 10.0000 mg | INTRAMUSCULAR | Status: DC | PRN

## 2023-05-30 MED ORDER — ACETAMINOPHEN 325 MG PO TABS: 650.0000 mg | ORAL_TABLET | Freq: Four times a day (QID) | ORAL | Status: DC | PRN

## 2023-05-30 MED ORDER — LORAZEPAM 2 MG/ML IJ SOLN
1.0000 mg | Freq: Once | INTRAMUSCULAR | Status: DC
  Filled 2023-05-30: qty 1

## 2023-05-30 MED ORDER — SODIUM CHLORIDE 0.9 % IV SOLN
1.0000 g | INTRAVENOUS | Status: DC
  Administered 2023-05-30: 1 g via INTRAVENOUS
  Filled 2023-05-30: qty 10

## 2023-05-30 NOTE — ED Triage Notes (Signed)
 Pt via [facility] from home after being found down in the floor during a well check by [employer]. Family reported that she had not been reachable for 2 days prior to well check. Pt is not sure how long she was down and has altered mental status. EMS notes pt smells of ammonia and is agitated/confused. Pt lives by herself and states she is not mentating at her normal baseline. Meet EMS sepsis criteria. A/O x 2 (person and place) and remains agitated at time of triage.   BP 159/99 HR 98 O2 99% RA Lungs clear CBG 146 eTCO2 29

## 2023-05-30 NOTE — ED Notes (Signed)
 Neuro checks 8pm, 10pm, 12am, 2am, 4am, 6am, 8am

## 2023-05-30 NOTE — Hospital Course (Signed)
 Raven Buckley is a 68 y.o. female with medical history significant for HTN, HLD, depression/anxiety, tobacco and marijuana use who is admitted for evaluation of acute encephalopathy.

## 2023-05-30 NOTE — ED Notes (Signed)
 Called lab to request add-on of magnesium and CK as ordered.

## 2023-05-30 NOTE — ED Provider Notes (Signed)
 [place] EMERGENCY DEPARTMENT AT [facility] Provider Note  CSN: [id number] Arrival date & time: 05/30/23 1842  Chief Complaint(s) Fall and Altered Mental Status  HPI Raven Buckley is a 68 y.o. female with history of hypertension presenting to the emergency department with altered mental status.  Patient apparently had not been responding to family's calls and missed visit with family.  Police were called for wellness check, patient was found on the floor in the bathroom.  Patient was not able to say whether she had fallen or how long she had been on the floor.  EMS reported that the patient seemed to be living in a hoarder house, was refusing to leave the house, had to be brought to the stretcher.  Patient was agitated on transport.  Patient reports that she is here for "high blood pressure" but otherwise not really able to provide any additional history.  History limited due to altered mental status.   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 Authorizing Provider  amLODipine  (NORVASC ) 10 MG tablet TAKE 1 TABLET (10 MG TOTAL) BY MOUTH DAILY. 05/15/21 05/15/22  Newlin, Enobong, MD  atorvastatin  (LIPITOR) 20 MG tablet Take 1 tablet (20 mg total) by mouth daily. 03/03/21   Newlin, Enobong, MD  buPROPion  (WELLBUTRIN  SR) 150 MG 12 hr tablet Take 1 tablet (150 mg total) by mouth 2 (two) times daily. For smoking cessation 10/28/20   Newlin, Enobong, MD  carvedilol  (COREG ) 25 MG tablet TAKE 1 TABLET (25 MG TOTAL) BY MOUTH 2 (TWO) TIMES DAILY WITH A MEAL. 05/15/21 05/15/22  Newlin, Enobong, MD  cetirizine   (ZYRTEC ) 10 MG tablet Take 1 tablet (10 mg total) by mouth daily. 05/06/21   Newlin, Enobong, MD  diclofenac  Sodium (VOLTAREN ) 1 % GEL Apply 4 g topically 4 (four) times daily. 10/29/22   Albertus Hughs, DO  lisinopril -hydrochlorothiazide  (ZESTORETIC ) 20-12.5 MG tablet Take 2 tablets by mouth daily. 05/15/21   Newlin, Enobong, MD  nicotine  (NICODERM CQ  - DOSED IN MG/24 HOURS) 14 mg/24hr patch Place 1 patch (14 mg total) onto the skin daily. 03/20/19   Oral Billings, MD  PARoxetine  (PAXIL ) 20 MG tablet Take 1 tablet (20 mg total) by mouth daily. 08/19/21   Newlin, Enobong, MD  polyethylene glycol powder (GLYCOLAX /MIRALAX ) 17 GM/SCOOP powder Take 17 g by mouth daily. 02/04/21   Newlin, Enobong, MD  Past Surgical History No past surgical history on file. Family History No family history on file.  Social History Social History   Tobacco Use   Smoking status: Every Day    Current packs/day: 0.50    Types: Cigarettes   Smokeless tobacco: Never  Substance Use Topics   Alcohol use: No   Drug use: Yes    Types: Marijuana   Allergies Penicillins  Review of Systems Review of Systems  All other systems reviewed and are negative.   Physical Exam Vital Signs  I have reviewed the triage vital signs BP (!) 151/85   Pulse 95   Temp 98.2 F (36.8 C)   Resp 20   Ht 4\' 11"  (1.499 m)   Wt 54.4 kg   SpO2 98%   BMI 24.24 kg/m  Physical Exam Vitals and nursing note reviewed.  Constitutional:      General: She is not in acute distress.    Appearance: She is well-developed.  HENT:     Head: Normocephalic and atraumatic.     Mouth/Throat:     Mouth: Mucous membranes are moist.  Eyes:     Pupils: Pupils are equal, round, and reactive to light.  Cardiovascular:     Rate and Rhythm: Normal rate and regular rhythm.     Heart sounds: No murmur heard. Pulmonary:      Effort: Pulmonary effort is normal. No respiratory distress.     Breath sounds: Normal breath sounds.  Abdominal:     General: Abdomen is flat.     Palpations: Abdomen is soft.     Tenderness: There is no abdominal tenderness.  Musculoskeletal:        General: No tenderness.     Right lower leg: No edema.     Left lower leg: No edema.     Comments: Moves all 4 extremities, no obvious signs of trauma on exam  Skin:    General: Skin is warm and dry.  Neurological:     Mental Status: She is alert.     Comments: Oriented to self, place, year, but not to situation.  Intermittently follows commands but often just pulls the sheet over her head  Psychiatric:        Mood and Affect: Mood normal.        Behavior: Behavior normal.     ED Results and Treatments Labs (all labs ordered are listed, but only abnormal results are displayed) Labs Reviewed  COMPREHENSIVE METABOLIC PANEL WITH GFR - Abnormal; Notable for the following components:      Result Value   Potassium 3.3 (*)    Chloride 96 (*)    Glucose, Bld 150 (*)    Total Protein 8.7 (*)    All other components within normal limits  SALICYLATE LEVEL - Abnormal; Notable for the following components:   Salicylate Lvl <7.0 (*)    All other components within normal limits  ACETAMINOPHEN  LEVEL - Abnormal; Notable for the following components:   Acetaminophen  (Tylenol ), Serum <10 (*)    All other components within normal limits  CULTURE, BLOOD (ROUTINE X 2)  CULTURE, BLOOD (ROUTINE X 2)  CBC  ETHANOL  AMMONIA  TSH  MAGNESIUM  CK  RAPID URINE DRUG SCREEN, HOSP PERFORMED  URINALYSIS, W/ REFLEX TO CULTURE (INFECTION SUSPECTED)  Radiology DG Chest Portable 1 View Result Date: 05/30/2023 CLINICAL DATA:  Chest pain.  Found down. EXAM: PORTABLE CHEST 1 VIEW COMPARISON:  01/07/2016 FINDINGS: Examination is limited by  clothing artifact. The cardiac silhouette, mediastinal and hilar contours are within normal limits given the AP projection, portable technique and semi upright position of the patient. No acute pulmonary findings. No pleural effusions or pulmonary lesions. The bony thorax is intact. IMPRESSION: No acute cardiopulmonary findings. Electronically Signed   By: Marrian Siva M.D.   On: 05/30/2023 22:02   CT Head Wo Contrast Result Date: 05/30/2023 CLINICAL DATA:  Altered level of consciousness, headache EXAM: CT HEAD WITHOUT CONTRAST TECHNIQUE: Contiguous axial images were obtained from the base of the skull through the vertex without intravenous contrast. RADIATION DOSE REDUCTION: This exam was performed according to the departmental dose-optimization program which includes automated exposure control, adjustment of the mA and/or kV according to patient size and/or use of iterative reconstruction technique. COMPARISON:  05/30/2023, 03/18/2019 FINDINGS: Brain: No acute infarct or hemorrhage. Lateral ventricles and midline structures are unremarkable. No acute extra-axial fluid collections. No mass effect. Vascular: No hyperdense vessel or unexpected calcification. Skull: Normal. Negative for fracture or focal lesion. Sinuses/Orbits: No acute finding. Other: None. IMPRESSION: 1. No acute intracranial process. Electronically Signed   By: Bobbye Burrow M.D.   On: 05/30/2023 21:37   CT Head Wo Contrast Result Date: 05/30/2023 CLINICAL DATA:  Headache, new onset (Age >= 51y) EXAM: CT HEAD WITHOUT CONTRAST TECHNIQUE: Contiguous axial images were obtained from the base of the skull through the vertex without intravenous contrast. RADIATION DOSE REDUCTION: This exam was performed according to the departmental dose-optimization program which includes automated exposure control, adjustment of the mA and/or kV according to patient size and/or use of iterative reconstruction technique. COMPARISON:  CT head 03/18/2019 FINDINGS:  Brain: Limited evaluation due to motion artifact. No definite evidence of large-territorial acute infarction. No definite parenchymal hemorrhage. No gross mass lesion. No definite extra-axial collection. No mass effect or midline shift. No hydrocephalus. Basilar cisterns are patent. Vascular: No hyperdense vessel. Skull: Limited evaluation. Sinuses/Orbits: Paranasal sinuses and mastoid air cells are clear. The orbits are unremarkable. Other: None. IMPRESSION: Limited evaluation due to motion artifact.  Recommend repeat. Electronically Signed   By: Morgane  Naveau M.D.   On: 05/30/2023 20:37    Pertinent labs & imaging results that were available during my care of the patient were reviewed by me and considered in my medical decision making (see MDM for details).  Medications Ordered in ED Medications  LORazepam (ATIVAN) injection 1 mg (has no administration in time range)  sodium chloride  0.9 % bolus 1,000 mL (1,000 mLs Intravenous New Bag/Given 05/30/23 1953)                                                                                                                                     Procedures Procedures  (including critical  care time)  Medical Decision Making / ED Course   MDM:  68 year old presenting to the emergency department with altered mental status.  Patient does seem somewhat confused, but responding to questions.  She is awake and alert.  Differential includes intracranial process, patient did not fall, will obtain CT head.  Differential also includes occult infectious process, will check chest x-ray, urinalysis.  No abdominal tenderness to suggest any intra-abdominal process.  No appreciable wounds or signs of infection on skin and soft tissue exam.  Differential also includes toxic metabolic abnormality such as electrolyte derangement, hyperammonemia, intoxication, will check labs for this as well.  Given EMS report of patient's living situation as well as her confusion  anticipate she will probably need to be admitted.  Clinical Course as of 05/30/23 2250  Sun May 30, 2023  2249 Workup so far reassuring.  Urinalysis pending.  Discussed with hospitalist Dr. Lydia Sams, who will admit the patient for altered mental status.  CT head, chest x-ray negative.  Ingestion labs negative.  CK reassuring.  Ammonia normal, TSH normal. [WS]    Clinical Course User Index [WS] Mordecai Applebaum, MD     Additional history obtained: -Additional history obtained from ems -External records from outside source obtained and reviewed including: Chart review including previous notes, labs, imaging, consultation notes including prior notes    Lab Tests: -I ordered, reviewed, and interpreted labs.   The pertinent results include:   Labs Reviewed  COMPREHENSIVE METABOLIC PANEL WITH GFR - Abnormal; Notable for the following components:      Result Value   Potassium 3.3 (*)    Chloride 96 (*)    Glucose, Bld 150 (*)    Total Protein 8.7 (*)    All other components within normal limits  SALICYLATE LEVEL - Abnormal; Notable for the following components:   Salicylate Lvl <7.0 (*)    All other components within normal limits  ACETAMINOPHEN  LEVEL - Abnormal; Notable for the following components:   Acetaminophen  (Tylenol ), Serum <10 (*)    All other components within normal limits  CULTURE, BLOOD (ROUTINE X 2)  CULTURE, BLOOD (ROUTINE X 2)  CBC  ETHANOL  AMMONIA  TSH  MAGNESIUM  CK  RAPID URINE DRUG SCREEN, HOSP PERFORMED  URINALYSIS, W/ REFLEX TO CULTURE (INFECTION SUSPECTED)    Notable for see MDM  EKG   EKG Interpretation Date/Time:  Sunday May 30 2023 19:50:27 EDT Ventricular Rate:  75 PR Interval:  147 QRS Duration:  83 QT Interval:  551 QTC Calculation: 616 R Axis:   39  Text Interpretation: Sinus rhythm Biatrial enlargement Left ventricular hypertrophy Prolonged QT interval Confirmed by Hiawatha Lout ([id number]) on 05/30/2023 7:55:54 PM          Imaging Studies ordered: I ordered imaging studies including CXR, CT head On my interpretation imaging demonstrates no acute process/artifact on first CT  I independently visualized and interpreted imaging. I agree with the radiologist interpretation   Medicines ordered and prescription drug management: Meds ordered this encounter  Medications   sodium chloride  0.9 % bolus 1,000 mL   LORazepam (ATIVAN) injection 1 mg    -I have reviewed the patients home medicines and have made adjustments as needed   Consultations Obtained: I requested consultation with the hospitalist,  and discussed lab and imaging findings as well as pertinent plan - they recommend: admission   Cardiac Monitoring: The patient was maintained on a cardiac monitor.  I personally viewed and interpreted the cardiac monitored which showed  an underlying rhythm of: NSR  Social Determinants of Health:  Diagnosis or treatment significantly limited by social determinants of health: lives alone   Reevaluation: After the interventions noted above, I reevaluated the patient and found that their symptoms have stayed the same  Co morbidities that complicate the patient evaluation  Past Medical History:  Diagnosis Date   Hypertension       Dispostion: Disposition decision including need for hospitalization was considered, and patient admitted to the hospital.    Final Clinical Impression(s) / ED Diagnoses Final diagnoses:  Altered mental status, unspecified altered mental status type     This chart was dictated using voice recognition software.  Despite best efforts to proofread,  errors can occur which can change the documentation meaning.    Mordecai Applebaum, MD 05/30/23 2251

## 2023-05-30 NOTE — H&P (Addendum)
 History and Physical    Raven Buckley:[id number] DOB: Nov 25, 1955 DOA: 05/30/2023  PCP: Joaquin Mulberry, MD  Patient coming from: Home  I have personally briefly reviewed patient's old medical records in [facility]  Chief Complaint: Altered mental status  HPI: Raven Buckley is a 68 y.o. female with medical history significant for HTN, HLD, depression/anxiety, tobacco and marijuana use who presented to the ED for evaluation of altered mental status.  Family had been trying to reach patient for the last 2 days but could not get in touch with her.  They called [employer] for a well check and patient was found down on the bathroom floor and appeared agitated/confused.  She lives alone.  EMS reported that the patient seemed to be living in a hoarder house and initially was refusing to leave the house.  Patient is awake and alert and oriented to self and year only.  She thinks she is still in her home.  She does not know why she is in the hospital.  She does not recall falling.  Her only complaints are headache and some stomach pain.  She denies any neck pain, chest pain, dyspnea, nausea, vomiting, dysuria, diarrhea.  She reports that she does smoke a few cigarettes per day.  She reports occasional marijuana use but states that she has not used it recently.  She denies any other drug use.  She says the only medications she takes are for blood pressure.  She says she is not taking any medications for mood, depression, anxiety.  ED Course  Labs/Imaging on admission: I have personally reviewed following labs and imaging studies.  Initial vitals showed BP 172/78, pulse 78, RR 18, temp not recorded.  SpO2 96% on room air.  Labs show sodium 135, potassium 3.3, magnesium 1.9, bicarb 25, BUN 12, creatinine 0.83, serum glucose 150, LFTs within normal limits, WBC 10.5, hemoglobin 13.9, platelets 392, TSH 0.872, CK1 92.  Serum ethanol, acetaminophen , and salicylate levels undetectable.  Blood cultures  in process.  UDS and UA pending collection.  Ammonia 21.  CT head without contrast negative for acute intracranial process.  Portable chest x-ray negative for focal consolidation, edema, effusion.  Patient was given 1 L normal saline.  Hospitalist service was consulted to admit for further evaluation and management.  Review of Systems: All systems reviewed and are negative except as documented in history of present illness above.   Past Medical History:  Diagnosis Date   Hypertension     No past surgical history on file.  Social History: She reports that she does smoke a few cigarettes per day.  She reports occasional marijuana use but states that she has not used it recently.  She denies any other drug use.  Allergies  Allergen Reactions   Penicillins Other (See Comments)     pt stated: "I don't know it just bothers me but I did not have a rash or swelling"      No family history on file.   Prior to Admission medications   Medication Sig Start Date End Date Taking? Authorizing Provider  amLODipine  (NORVASC ) 10 MG tablet TAKE 1 TABLET (10 MG TOTAL) BY MOUTH DAILY. 05/15/21 05/15/22  Newlin, Enobong, MD  atorvastatin  (LIPITOR) 20 MG tablet Take 1 tablet (20 mg total) by mouth daily. 03/03/21   Newlin, Enobong, MD  buPROPion  (WELLBUTRIN  SR) 150 MG 12 hr tablet Take 1 tablet (150 mg total) by mouth 2 (two) times daily. For smoking cessation 10/28/20  Newlin, Enobong, MD  carvedilol  (COREG ) 25 MG tablet TAKE 1 TABLET (25 MG TOTAL) BY MOUTH 2 (TWO) TIMES DAILY WITH A MEAL. 05/15/21 05/15/22  Newlin, Enobong, MD  cetirizine  (ZYRTEC ) 10 MG tablet Take 1 tablet (10 mg total) by mouth daily. 05/06/21   Newlin, Enobong, MD  diclofenac  Sodium (VOLTAREN ) 1 % GEL Apply 4 g topically 4 (four) times daily. 10/29/22   Albertus Hughs, DO  lisinopril -hydrochlorothiazide  (ZESTORETIC ) 20-12.5 MG tablet Take 2 tablets by mouth daily. 05/15/21   Newlin, Enobong, MD  nicotine  (NICODERM CQ  - DOSED IN MG/24  HOURS) 14 mg/24hr patch Place 1 patch (14 mg total) onto the skin daily. 03/20/19   Oral Billings, MD  PARoxetine  (PAXIL ) 20 MG tablet Take 1 tablet (20 mg total) by mouth daily. 08/19/21   Newlin, Enobong, MD  polyethylene glycol powder (GLYCOLAX /MIRALAX ) 17 GM/SCOOP powder Take 17 g by mouth daily. 02/04/21   Joaquin Mulberry, MD    Physical Exam: Vitals:   05/30/23 1850 05/30/23 1852 05/30/23 2200 05/30/23 2232  BP: (!) 172/78  (!) 168/67 (!) 151/85  Pulse: 78   95  Resp: 18   20  Temp:    98.2 F (36.8 C)  SpO2: 96%   98%  Weight:  54.4 kg    Height:  4\' 11"  (1.499 m)     Constitutional: Resting in bed, NAD, calm, comfortable Eyes: EOMI, lids and conjunctivae normal ENMT: Mucous membranes are moist. Posterior pharynx clear of any exudate or lesions.Normal dentition.  Neck: normal, supple, no masses.  ROM intact. Respiratory: clear to auscultation bilaterally, no wheezing, no crackles. Normal respiratory effort. No accessory muscle use.  Cardiovascular: Regular rate and rhythm, no murmurs / rubs / gallops. No extremity edema. 2+ pedal pulses. Abdomen: no tenderness, no masses palpated. Musculoskeletal: no clubbing / cyanosis. No joint deformity upper and lower extremities. Good ROM, no contractures. Normal muscle tone.  Skin: no rashes, lesions, ulcers. No induration Neurologic: Sensation intact. Strength 5/5 in all 4.  Psychiatric: Alert and oriented to self and year only.  Did not realize she was in the hospital, thought she was at home.  Does not know why she is here.  EKG: Personally reviewed.  Sinus rhythm, rate 75, LVH, QTc 616.  QTc is more prolonged when compared to prior.  Assessment/Plan Principal Problem:   Acute encephalopathy Active Problems:   Hypokalemia   Hypertension   Prolonged Q-T interval on ECG   Raven Buckley is a 68 y.o. female with medical history significant for HTN, HLD, depression/anxiety, tobacco and marijuana use who is admitted for evaluation of  acute encephalopathy.  Assessment and Plan: Acute encephalopathy: Unclear etiology.  Oriented to self and year only, did not realize she was in the hospital but thought she was at home at time of admission.  Does not recall events prior to coming to the hospital. - CT head and CXR unrevealing -UA abnormal, urine culture in process; will start empiric IV ceftriaxone  - EtOH, acetaminophen , salicylate levels undetectable - UDS positive for THC - Blood cultures in process - Keep on telemetry - TSH, ammonia, CK WNL - Check B12 and RPR - Continue neurochecks  Prolonged QTc: Supplement potassium, limit QTc prolonging medications, and keep on telemetry.  Hypokalemia: Supplementing.  Hypertension: Unclear medication adherence.  BP only mildly elevated, unlikely to be hypertensive encephalopathy.  IV hydralazine ordered as needed.  Med reconciliation is pending.   DVT prophylaxis: enoxaparin  (LOVENOX ) injection 40 mg Start: 05/31/23 1000 Code Status: Full  code Family Communication: None present on admission Disposition Plan: Pending clinical progress Consults called: None Severity of Illness: The appropriate patient status for this patient is OBSERVATION. Observation status is judged to be reasonable and necessary in order to provide the required intensity of service to ensure the patient's safety. The patient's presenting symptoms, physical exam findings, and initial radiographic and laboratory data in the context of their medical condition is felt to place them at decreased risk for further clinical deterioration. Furthermore, it is anticipated that the patient will be medically stable for discharge from the hospital within 2 midnights of admission.   Edith Gores MD [facility]  If 7PM-7AM, please contact night-coverage [email or web address]  05/30/2023, 11:22 PM

## 2023-05-31 ENCOUNTER — Other Ambulatory Visit: Payer: Self-pay

## 2023-05-31 DIAGNOSIS — G9341 Metabolic encephalopathy: Secondary | ICD-10-CM | POA: Diagnosis not present

## 2023-05-31 DIAGNOSIS — E876 Hypokalemia: Secondary | ICD-10-CM | POA: Diagnosis not present

## 2023-05-31 DIAGNOSIS — G9340 Encephalopathy, unspecified: Secondary | ICD-10-CM | POA: Diagnosis not present

## 2023-05-31 LAB — BLOOD CULTURE ID PANEL (REFLEXED) - BCID2

## 2023-05-31 LAB — HIV ANTIBODY (ROUTINE TESTING W REFLEX): HIV Screen 4th Generation wRfx: NONREACTIVE

## 2023-05-31 LAB — CBC
HCT: 39.1 % (ref 36.0–46.0)
Hemoglobin: 12.8 g/dL (ref 12.0–15.0)
MCH: 29.6 pg (ref 26.0–34.0)
MCHC: 32.7 g/dL (ref 30.0–36.0)
MCV: 90.3 fL (ref 80.0–100.0)
Platelets: 348 10*3/uL (ref 150–400)
RBC: 4.33 MIL/uL (ref 3.87–5.11)
RDW: 13.2 % (ref 11.5–15.5)
WBC: 8.4 10*3/uL (ref 4.0–10.5)
nRBC: 0 % (ref 0.0–0.2)

## 2023-05-31 LAB — BASIC METABOLIC PANEL WITH GFR
Anion gap: 11 (ref 5–15)
BUN: 12 mg/dL (ref 8–23)
CO2: 23 mmol/L (ref 22–32)
Calcium: 8.9 mg/dL (ref 8.9–10.3)
Chloride: 101 mmol/L (ref 98–111)
Creatinine, Ser: 0.6 mg/dL (ref 0.44–1.00)
GFR, Estimated: >60 mL/min (ref >60)
Glucose, Bld: 118 mg/dL — ABNORMAL HIGH (ref 70–99)
Potassium: 3.3 mmol/L — ABNORMAL LOW (ref 3.5–5.1)
Sodium: 135 mmol/L (ref 135–145)

## 2023-05-31 LAB — RPR
RPR Ser Ql: REACTIVE — AB
RPR Titer: 1:1 {titer}

## 2023-05-31 LAB — VITAMIN B12: Vitamin B-12: 158 pg/mL — ABNORMAL LOW (ref 180–914)

## 2023-05-31 MED ORDER — ATORVASTATIN CALCIUM 20 MG PO TABS
20.0000 mg | ORAL_TABLET | Freq: Every day | ORAL | Status: DC
  Administered 2023-05-31 – 2023-06-01 (×2): 20 mg via ORAL
  Filled 2023-05-31 (×2): qty 1

## 2023-05-31 MED ORDER — SODIUM CHLORIDE 0.9 % IV SOLN
1.0000 g | Freq: Every day | INTRAVENOUS | Status: DC
  Administered 2023-05-31: 1 g via INTRAVENOUS
  Filled 2023-05-31: qty 10

## 2023-05-31 MED ORDER — POLYETHYLENE GLYCOL 3350 17 G PO PACK
17.0000 g | PACK | Freq: Every day | ORAL | Status: DC
  Administered 2023-05-31 – 2023-06-01 (×2): 17 g via ORAL
  Filled 2023-05-31 (×2): qty 1

## 2023-05-31 MED ORDER — MAGNESIUM OXIDE -MG SUPPLEMENT 400 (240 MG) MG PO TABS
400.0000 mg | ORAL_TABLET | Freq: Two times a day (BID) | ORAL | Status: AC
  Administered 2023-05-31 (×2): 400 mg via ORAL
  Filled 2023-05-31 (×2): qty 1

## 2023-05-31 MED ORDER — POTASSIUM CHLORIDE CRYS ER 20 MEQ PO TBCR
40.0000 meq | EXTENDED_RELEASE_TABLET | Freq: Once | ORAL | Status: AC
  Administered 2023-05-31: 40 meq via ORAL
  Filled 2023-05-31: qty 2

## 2023-05-31 MED ORDER — CYANOCOBALAMIN 1000 MCG/ML IJ SOLN
1000.0000 ug | Freq: Every day | INTRAMUSCULAR | Status: DC
  Administered 2023-05-31 – 2023-06-01 (×2): 1000 ug via SUBCUTANEOUS
  Filled 2023-05-31 (×2): qty 1

## 2023-05-31 MED ORDER — BUPROPION HCL ER (SR) 150 MG PO TB12
150.0000 mg | ORAL_TABLET | Freq: Two times a day (BID) | ORAL | Status: DC
Start: 2023-05-31 — End: 2023-06-01
  Administered 2023-05-31 – 2023-06-01 (×3): 150 mg via ORAL
  Filled 2023-05-31 (×3): qty 1

## 2023-05-31 MED ORDER — CARVEDILOL 12.5 MG PO TABS
12.5000 mg | ORAL_TABLET | Freq: Two times a day (BID) | ORAL | Status: DC
  Administered 2023-05-31 – 2023-06-01 (×3): 12.5 mg via ORAL
  Filled 2023-05-31 (×3): qty 1

## 2023-05-31 MED ORDER — AMLODIPINE BESYLATE 10 MG PO TABS
5.0000 mg | ORAL_TABLET | Freq: Every day | ORAL | Status: DC
  Administered 2023-05-31 – 2023-06-01 (×2): 5 mg via ORAL
  Filled 2023-05-31 (×2): qty 1

## 2023-05-31 NOTE — Plan of Care (Signed)
  Problem: Education: Goal: Knowledge of General Education information will improve Description: Including pain rating scale, medication(s)/side effects and non-pharmacologic comfort measures Outcome: Progressing   Problem: Clinical Measurements: Goal: Diagnostic test results will improve Outcome: Progressing   Problem: Activity: Goal: Risk for activity intolerance will decrease Outcome: Progressing   Problem: Nutrition: Goal: Adequate nutrition will be maintained Outcome: Progressing   Problem: Safety: Goal: Ability to remain free from injury will improve Outcome: Progressing

## 2023-05-31 NOTE — Evaluation (Signed)
 Occupational Therapy Evaluation Patient Details Name: Raven Buckley MRN: [id number] DOB: 17-Nov-1955 Today's Date: 05/31/2023   History of Present Illness   Patient is a 68 year old female who presented with 2 day history of AMS. Patient was admitted with acute metabolic encephalopathy. PMH: hyperlipidemia, HTN,     Clinical Impressions Patient is a 68 year old female who was admitted for above. Patient reporting living at home alone with patient not very forthcoming with information during session. Patient's eval was self limited to bed level even with education on OT role in continuum of care. Patient would benefit from cognitive assessments but not agreeable to engagement in this during session with patient notably frustrated with attempts. Patient would continue to benefit from skilled OT services at this time while admitted and after d/c to address noted deficits in order to improve overall safety and independence in ADLs.       If plan is discharge home, recommend the following:   A lot of help with bathing/dressing/bathroom;Assistance with cooking/housework;Direct supervision/assist for medications management;Assist for transportation;Supervision due to cognitive status;Help with stairs or ramp for entrance;Direct supervision/assist for financial management;A lot of help with walking and/or transfers     Functional Status Assessment   Patient has had a recent decline in their functional status and demonstrates the ability to make significant improvements in function in a reasonable and predictable amount of time.     Equipment Recommendations   None recommended by OT      Precautions/Restrictions   Precautions Precautions: Fall Restrictions Weight Bearing Restrictions Per Provider Order: No     Mobility Bed Mobility Overal bed mobility: Modified Independent             General bed mobility comments: patient was MI with bed mobility with patient declining to  move further                ADL either performed or assessed with clinical judgement   ADL Overall ADL's : Needs assistance/impaired Eating/Feeding: Modified independent;Sitting Eating/Feeding Details (indicate cue type and reason): eating bites of jello with patient needing cues to remain sitting to complete bites of food with patient attempting to lay back down with foot in mouth. Grooming: Sitting;Supervision/safety   Upper Body Bathing: Sitting;Supervision/ safety   Lower Body Bathing: Minimal assistance;Sitting/lateral leans   Upper Body Dressing : Sitting;Supervision/safety   Lower Body Dressing: Sitting/lateral leans;Minimal assistance     Toilet Transfer Details (indicate cue type and reason): patient was MI For bed mobility. patient declined to get out of bed at this time.                 Vision   Vision Assessment?: No apparent visual deficits            Pertinent Vitals/Pain Pain Assessment Pain Assessment: No/denies pain     Extremity/Trunk Assessment Upper Extremity Assessment Upper Extremity Assessment: Overall WFL for tasks assessed   Lower Extremity Assessment Lower Extremity Assessment: Defer to PT evaluation       Communication     Cognition Arousal: Alert Behavior During Therapy: [facility] for tasks assessed/performed Cognition: Difficult to assess             OT - Cognition Comments: patient was short with answers with therapist when attempting to transition to cognitive questions. will attempt formal assessment during next session if patient agreeable. patient reported not remembering why she came to hospital  Home Living Family/patient expects to be discharged to:: Private residence Living Arrangements: Alone Available Help at Discharge: Family;Available PRN/intermittently Type of Home: House Home Access: Level entry     Home Layout: One level     Bathroom Shower/Tub:  Walk-in shower         Home Equipment: Shower seat          Prior Functioning/Environment Prior Level of Function : Independent/Modified Independent               ADLs Comments: patient reported she did not drive.    OT Problem List: Impaired balance (sitting and/or standing);Decreased safety awareness;Decreased knowledge of precautions;Decreased knowledge of use of DME or AE;Decreased activity tolerance;Decreased cognition   OT Treatment/Interventions: Self-care/ADL training;DME and/or AE instruction;Therapeutic activities;Balance training;Therapeutic exercise;Energy conservation;Patient/family education      OT Goals(Current goals can be found in the care plan section)   Acute Rehab OT Goals Patient Stated Goal: to get better OT Goal Formulation: With patient Time For Goal Achievement: 06/14/23 Potential to Achieve Goals: Fair   OT Frequency:  Min 2X/week       AM-PAC OT "6 Clicks" Daily Activity     Outcome Measure Help from another person eating meals?: A Little Help from another person taking care of personal grooming?: A Little Help from another person toileting, which includes using toliet, bedpan, or urinal?: A Lot Help from another person bathing (including washing, rinsing, drying)?: A Lot Help from another person to put on and taking off regular upper body clothing?: A Little Help from another person to put on and taking off regular lower body clothing?: A Lot 6 Click Score: 15   End of Session Nurse Communication: Mobility status  Activity Tolerance: Patient tolerated treatment well Patient left: in bed;with call bell/phone within reach;with bed alarm set  OT Visit Diagnosis: Other symptoms and signs involving cognitive function;Unsteadiness on feet (R26.81)                Time: 0981-1914 OT Time Calculation (min): 12 min Charges:  OT General Charges $OT Visit: 1 Visit OT Evaluation $OT Eval Low Complexity: 1 Low  Thorsten Climer OTR/L, MS Acute  Rehabilitation Department Office# [phone]   Jame Maze 05/31/2023, 9:13 AM

## 2023-05-31 NOTE — Progress Notes (Signed)
 [facility] PROGRESS NOTE    Progress Note  Raven Buckley  ZOX:[id number] DOB: 13-Aug-1955 DOA: 05/30/2023 PCP: Joaquin Mulberry, MD     Brief Narrative:   Raven Buckley is an 68 y.o. female past medical history significant for essential hypertension hyperlipidemia marijuana use comes to the ED for altered mental status problem started 2 days prior to admission the family called me through police department and found her in the bathroom floor and agitated  Assessment/Plan:   Acute metabolic encephalopathy: Imaging CT and chest x-ray is unrevealing. UA is abnormal urine culture in process she will start empirically on IV Rocephin . Alcohol acetaminophen  and salicylate levels are undetectable. UDS is positive for cannabis. Blood cultures have been ordered. B12 150 started on B12 repletion. RPR pending TSH 0.8 Ammonia unremarkable CK is 192  Hypokalemia/prolonged QTc: Tube potassium greater than 4 magnesium greater than 2.  Essential hypertension Hydralazine as needed. Resume Norvasc  and Coreg . Continue to hold ACE inhibitor and diuretic therapy.    DVT prophylaxis: lovenox  Family Communication:none Status is: Observation The patient remains OBS appropriate and will d/c before 2 midnights.    Code Status:     Code Status Orders  (From admission, onward)           Start     Ordered   05/30/23 2259  Full code  Continuous       Question:  By:  Answer:  Default: patient does not have capacity for decision making, no surrogate or prior directive available   05/30/23 2301           Code Status History     Date Active Date Inactive Code Status Order ID Comments User Context   03/18/2019 1847 03/19/2019 2042 Full Code [id number]  Karalee Oscar, MD Inpatient   08/29/2011 0204 08/31/2011 2137 Full Code [id number]  Sander Crooked, MD ED         IV Access:   Peripheral IV   Procedures and diagnostic studies:   DG Chest Portable 1 View Result Date:  05/30/2023 CLINICAL DATA:  Chest pain.  Found down. EXAM: PORTABLE CHEST 1 VIEW COMPARISON:  01/07/2016 FINDINGS: Examination is limited by clothing artifact. The cardiac silhouette, mediastinal and hilar contours are within normal limits given the AP projection, portable technique and semi upright position of the patient. No acute pulmonary findings. No pleural effusions or pulmonary lesions. The bony thorax is intact. IMPRESSION: No acute cardiopulmonary findings. Electronically Signed   By: Marrian Siva M.D.   On: 05/30/2023 22:02   CT Head Wo Contrast Result Date: 05/30/2023 CLINICAL DATA:  Altered level of consciousness, headache EXAM: CT HEAD WITHOUT CONTRAST TECHNIQUE: Contiguous axial images were obtained from the base of the skull through the vertex without intravenous contrast. RADIATION DOSE REDUCTION: This exam was performed according to the departmental dose-optimization program which includes automated exposure control, adjustment of the mA and/or kV according to patient size and/or use of iterative reconstruction technique. COMPARISON:  05/30/2023, 03/18/2019 FINDINGS: Brain: No acute infarct or hemorrhage. Lateral ventricles and midline structures are unremarkable. No acute extra-axial fluid collections. No mass effect. Vascular: No hyperdense vessel or unexpected calcification. Skull: Normal. Negative for fracture or focal lesion. Sinuses/Orbits: No acute finding. Other: None. IMPRESSION: 1. No acute intracranial process. Electronically Signed   By: Bobbye Burrow M.D.   On: 05/30/2023 21:37   CT Head Wo Contrast Result Date: 05/30/2023 CLINICAL DATA:  Headache, new onset (Age >= 51y) EXAM: CT HEAD WITHOUT CONTRAST TECHNIQUE: Contiguous  axial images were obtained from the base of the skull through the vertex without intravenous contrast. RADIATION DOSE REDUCTION: This exam was performed according to the departmental dose-optimization program which includes automated exposure control,  adjustment of the mA and/or kV according to patient size and/or use of iterative reconstruction technique. COMPARISON:  CT head 03/18/2019 FINDINGS: Brain: Limited evaluation due to motion artifact. No definite evidence of large-territorial acute infarction. No definite parenchymal hemorrhage. No gross mass lesion. No definite extra-axial collection. No mass effect or midline shift. No hydrocephalus. Basilar cisterns are patent. Vascular: No hyperdense vessel. Skull: Limited evaluation. Sinuses/Orbits: Paranasal sinuses and mastoid air cells are clear. The orbits are unremarkable. Other: None. IMPRESSION: Limited evaluation due to motion artifact.  Recommend repeat. Electronically Signed   By: Morgane  Naveau M.D.   On: 05/30/2023 20:37     Medical Consultants:   None.   Subjective:    Raven Buckley no complaints feels better  Objective:    Vitals:   05/30/23 2232 05/31/23 0228 05/31/23 0536 05/31/23 0618  BP: (!) 151/85 (!) 167/98 139/64 (!) 142/77  Pulse: 95 95 70 76  Resp: 20 19 18 16   Temp: 98.2 F (36.8 C) 98.1 F (36.7 C) 98.8 F (37.1 C) 98.7 F (37.1 C)  TempSrc:   Oral Oral  SpO2: 98% 99% 97% 100%  Weight:      Height:       SpO2: 100 %   Intake/Output Summary (Last 24 hours) at 05/31/2023 0811 Last data filed at 05/31/2023 0132 Gross per 24 hour  Intake 1100 ml  Output --  Net 1100 ml   Filed Weights   05/30/23 1852  Weight: 54.4 kg    Exam: General exam: In no acute distress. Respiratory system: Good air movement and clear to auscultation. Cardiovascular system: S1 & S2 heard, RRR. No JVD. Gastrointestinal system: Abdomen is nondistended, soft and nontender.  Central nervous system: Alert and oriented. No focal neurological deficits. Extremities: No pedal edema. Skin: No rashes, lesions or ulcers Psychiatry: Judgement and insight appear normal. Mood & affect appropriate.    Data Reviewed:    Labs: Basic Metabolic Panel: Recent Labs  Lab  05/30/23 1959 05/31/23 0535  NA 135 135  K 3.3* 3.3*  CL 96* 101  CO2 25 23  GLUCOSE 150* 118*  BUN 12 12  CREATININE 0.83 0.60  CALCIUM  9.6 8.9  MG 1.9  --    GFR Estimated Creatinine Clearance: 51.4 mL/min (by C-G formula based on SCr of 0.6 mg/dL). Liver Function Tests: Recent Labs  Lab 05/30/23 1959  AST 27  ALT 14  ALKPHOS 100  BILITOT 1.1  PROT 8.7*  ALBUMIN 4.4   No results for input(s): "LIPASE", "AMYLASE" in the last 168 hours. Recent Labs  Lab 05/30/23 1958  AMMONIA 21   Coagulation profile No results for input(s): "INR", "PROTIME" in the last 168 hours. COVID-19 Labs  No results for input(s): "DDIMER", "FERRITIN", "LDH", "CRP" in the last 72 hours.  Lab Results  Component Value Date   SARSCOV2NAA NEGATIVE 03/18/2019    CBC: Recent Labs  Lab 05/30/23 1959 05/31/23 0535  WBC 10.5 8.4  HGB 13.9 12.8  HCT 42.1 39.1  MCV 90.7 90.3  PLT 392 348   Cardiac Enzymes: Recent Labs  Lab 05/30/23 2012  CKTOTAL 192   BNP (last 3 results) No results for input(s): "PROBNP" in the last 8760 hours. CBG: No results for input(s): "GLUCAP" in the last 168 hours. D-Dimer: No results  for input(s): "DDIMER" in the last 72 hours. Hgb A1c: No results for input(s): "HGBA1C" in the last 72 hours. Lipid Profile: No results for input(s): "CHOL", "HDL", "LDLCALC", "TRIG", "CHOLHDL", "LDLDIRECT" in the last 72 hours. Thyroid function studies: Recent Labs    05/30/23 1959  TSH 0.872   Anemia work up: Recent Labs    05/31/23 0535  VITAMINB12 158*   Sepsis Labs: Recent Labs  Lab 05/30/23 1959 05/31/23 0535  WBC 10.5 8.4   Microbiology Recent Results (from the past 240 hours)  Culture, blood (routine x 2)     Status: None (Preliminary result)   Collection Time: 05/30/23  7:50 PM   Specimen: BLOOD  Result Value Ref Range Status   Specimen Description   Final    BLOOD LEFT ANTECUBITAL Performed at [facility], [address], [place], [place] [address]    Special Requests   Final    BOTTLES DRAWN AEROBIC AND ANAEROBIC Blood Culture results may not be optimal due to an inadequate volume of blood received in culture bottles   Culture   Final    NO GROWTH < 12 HOURS Performed at [facility], [address], [place], [place] [address]    Report Status PENDING  Incomplete  Culture, blood (routine x 2)     Status: None (Preliminary result)   Collection Time: 05/30/23  7:50 PM   Specimen: BLOOD  Result Value Ref Range Status   Specimen Description   Final    BLOOD RIGHT ANTECUBITAL Performed at [facility], [address] [place], [place] [address]    Special Requests   Final    BOTTLES DRAWN AEROBIC AND ANAEROBIC Blood Culture adequate volume   Culture   Final    NO GROWTH < 12 HOURS Performed at [facility], [address] [place], [place] [address]    Report Status PENDING  Incomplete     Medications:    enoxaparin  (LOVENOX ) injection  40 mg Subcutaneous Q24H   sodium chloride  flush  3 mL Intravenous Q12H   Continuous Infusions:  cefTRIAXone  (ROCEPHIN )  IV Stopped (05/31/23 0029)      LOS: 0 days   Macdonald Savoy  [facility]  05/31/2023, 8:11 AM

## 2023-05-31 NOTE — Progress Notes (Signed)
 PHARMACY - PHYSICIAN COMMUNICATION CRITICAL VALUE ALERT - BLOOD CULTURE IDENTIFICATION (BCID)  Raven Buckley is an 68 y.o. female who presented to [facility] on 05/30/2023 with a chief complaint of acute metabolic encephalopathy   Assessment:  presumed UTI started on Rocephin . 1 of 4 blood culture bottles growing staph epi with no resistance - presumed contaminant  Name of physician (or Provider) ContactedSheree Dieter  Current antibiotics: Rocephin    Changes to prescribed antibiotics recommended:  No changes recommended  Results for orders placed or performed during the hospital encounter of 05/30/23  Blood Culture ID Panel (Reflexed) (Collected: 05/30/2023  7:50 PM)  Result Value Ref Range   Enterococcus faecalis NOT DETECTED NOT DETECTED   Enterococcus Faecium NOT DETECTED NOT DETECTED   Listeria monocytogenes NOT DETECTED NOT DETECTED   Staphylococcus species DETECTED (A) NOT DETECTED   Staphylococcus aureus (BCID) NOT DETECTED NOT DETECTED   Staphylococcus epidermidis DETECTED (A) NOT DETECTED   Staphylococcus lugdunensis NOT DETECTED NOT DETECTED   Streptococcus species NOT DETECTED NOT DETECTED   Streptococcus agalactiae NOT DETECTED NOT DETECTED   Streptococcus pneumoniae NOT DETECTED NOT DETECTED   Streptococcus pyogenes NOT DETECTED NOT DETECTED   A.calcoaceticus-baumannii NOT DETECTED NOT DETECTED   Bacteroides fragilis NOT DETECTED NOT DETECTED   Enterobacterales NOT DETECTED NOT DETECTED   Enterobacter cloacae complex NOT DETECTED NOT DETECTED   Escherichia coli NOT DETECTED NOT DETECTED   Klebsiella aerogenes NOT DETECTED NOT DETECTED   Klebsiella oxytoca NOT DETECTED NOT DETECTED   Klebsiella pneumoniae NOT DETECTED NOT DETECTED   Proteus species NOT DETECTED NOT DETECTED   Salmonella species NOT DETECTED NOT DETECTED   Serratia marcescens NOT DETECTED NOT DETECTED   Haemophilus influenzae NOT DETECTED NOT DETECTED   Neisseria meningitidis NOT DETECTED NOT DETECTED    Pseudomonas aeruginosa NOT DETECTED NOT DETECTED   Stenotrophomonas maltophilia NOT DETECTED NOT DETECTED   Candida albicans NOT DETECTED NOT DETECTED   Candida auris NOT DETECTED NOT DETECTED   Candida glabrata NOT DETECTED NOT DETECTED   Candida krusei NOT DETECTED NOT DETECTED   Candida parapsilosis NOT DETECTED NOT DETECTED   Candida tropicalis NOT DETECTED NOT DETECTED   Cryptococcus neoformans/gattii NOT DETECTED NOT DETECTED   Methicillin resistance mecA/C NOT DETECTED NOT DETECTED    Bernett Brill 05/31/2023  5:05 PM

## 2023-05-31 NOTE — Evaluation (Signed)
 Physical Therapy Evaluation Patient Details Name: Raven Buckley MRN: [id number] DOB: [birth year] Today's Date: 05/31/2023  History of Present Illness  year old female who presented with 2 day history of AMS. Patient was admitted with acute metabolic encephalopathy. PMH: hyperlipidemia, HTN,  Clinical Impression  On eval, pt was Supv-Mod Ind with mobility. She tolerated activity well. Pt denied dizziness during session. Will plan to follow pt acutely during this hospital stay. Will recommend HHPT f/u to ensure safe transition back into home environment.         If plan is discharge home, recommend the following: A little help with walking and/or transfers;A little help with bathing/dressing/bathroom;Assistance with cooking/housework;Assist for transportation;Help with stairs or ramp for entrance   Can travel by private vehicle        Equipment Recommendations None recommended by PT  Recommendations for Other Services       Functional Status Assessment Patient has had a recent decline in their functional status and demonstrates the ability to make significant improvements in function in a reasonable and predictable amount of time.     Precautions / Restrictions Precautions Precautions: Fall Restrictions Weight Bearing Restrictions Per Provider Order: No      Mobility  Bed Mobility Overal bed mobility: Modified Independent                  Transfers Overall transfer level: Modified independent                      Ambulation/Gait Ambulation/Gait assistance: Supervision Gait Distance (Feet): 250 Feet Assistive device: None Gait Pattern/deviations: Step-through pattern       General Gait Details: Fair gait speed. No LOB. Tolerated distance well. Denied dizziness.  Stairs            Wheelchair Mobility     Tilt Bed    Modified Rankin (Stroke Patients Only)       Balance Overall balance assessment: Mild deficits observed, not formally  tested                                           Pertinent Vitals/Pain Pain Assessment Pain Assessment: No/denies pain    Home Living Family/patient expects to be discharged to:: Private residence Living Arrangements: Alone Available Help at Discharge: Family;Available PRN/intermittently Type of Home: House Home Access: Level entry       Home Layout: One level Home Equipment: Shower seat      Prior Function Prior Level of Function : Independent/Modified Independent               ADLs Comments: patient reported she did not drive.     Extremity/Trunk Assessment   Upper Extremity Assessment Upper Extremity Assessment: Defer to OT evaluation    Lower Extremity Assessment Lower Extremity Assessment: Overall WFL for tasks assessed    Cervical / Trunk Assessment Cervical / Trunk Assessment: Normal  Communication   Communication Communication: Impaired Factors Affecting Communication: Hearing impaired    Cognition Arousal: Alert Behavior During Therapy: WFL for tasks assessed/performed                             Following commands: Intact       Cueing Cueing Techniques: Verbal cues     General Comments      Exercises     Assessment/Plan  PT Assessment Patient needs continued PT services  PT Problem List Decreased mobility       PT Treatment Interventions Gait training;Functional mobility training;Therapeutic activities;Therapeutic exercise;Patient/family education;Balance training    PT Goals (Current goals can be found in the Care Plan section)  Acute Rehab PT Goals Patient Stated Goal: none stated PT Goal Formulation: With patient Time For Goal Achievement: 06/14/23 Potential to Achieve Goals: Good    Frequency Min 3X/week     Co-evaluation               AM-PAC PT "6 Clicks" Mobility  Outcome Measure Help needed turning from your back to your side while in a flat bed without using bedrails?:  None Help needed moving from lying on your back to sitting on the side of a flat bed without using bedrails?: None Help needed moving to and from a bed to a chair (including a wheelchair)?: None Help needed standing up from a chair using your arms (e.g., wheelchair or bedside chair)?: None Help needed to walk in hospital room?: A Little Help needed climbing 3-5 steps with a railing? : A Little 6 Click Score: 22    End of Session Equipment Utilized During Treatment: Gait belt Activity Tolerance: Patient tolerated treatment well Patient left: in bed;with call bell/phone within reach;with bed alarm set        Time: 1042-1050 PT Time Calculation (min) (ACUTE ONLY): 8 min   Charges:   PT Evaluation $PT Eval Low Complexity: 1 Low   PT General Charges $$ ACUTE PT VISIT: 1 Visit            Tanda Falter, PT Acute Rehabilitation  Office: [phone]

## 2023-05-31 NOTE — TOC Initial Note (Signed)
 Transition of Care [facility]) - Initial/Assessment Note    Patient Details  Name: Raven Buckley MRN: [id number] Date of Birth: [birth year]  Transition of Care [facility]) CM/SW Contact:    Gertha Ku, LCSW Phone Number: 05/31/2023, 3:09 PM  Clinical Narrative:                 CSW spoke with the pt to discuss the Medicare notice. Pt is from home and reports no DME needs. CSW discussed recommendations for home health services, but the pt has declined. Pt reports that her brother or sister will provide transportation home upon d/c. No further TOC needs, TOC sign-off.  Expected Discharge Plan: Home/Self Care Barriers to Discharge: Continued Medical Work up   Patient Goals and CMS Choice            Expected Discharge Plan and Services       Living arrangements for the past 2 months: Apartment                                      Prior Living Arrangements/Services Living arrangements for the past 2 months: Apartment Lives with:: Self Patient language and need for interpreter reviewed:: Yes Do you feel safe going back to the place where you live?: Yes      Need for Family Participation in Patient Care: No (Comment) Care giver support system in place?: No (comment)   Criminal Activity/Legal Involvement Pertinent to Current Situation/Hospitalization: No - Comment as needed  Activities of Daily Living   ADL Screening (condition at time of admission) Independently performs ADLs?: Yes (appropriate for developmental age) Is the patient deaf or have difficulty hearing?: No Does the patient have difficulty seeing, even when wearing glasses/contacts?: No Does the patient have difficulty concentrating, remembering, or making decisions?: Yes  Permission Sought/Granted                  Emotional Assessment Appearance:: Appears stated age Attitude/Demeanor/Rapport: Gracious Affect (typically observed): Accepting Orientation: : Oriented to  Time, Oriented to  Situation, Oriented to Place, Oriented to Self   Psych Involvement: No (comment)  Admission diagnosis:  Acute encephalopathy [G93.40] Altered mental status, unspecified altered mental status type [R41.82] Patient Active Problem List   Diagnosis Date Noted   Acute encephalopathy 05/30/2023   Prolonged Q-T interval on ECG 05/30/2023   Pain due to onychomycosis of toenails of both feet 01/05/2020   Porokeratosis 01/05/2020   Near syncope 03/18/2019   Hypertension 03/13/2016   Syncope 08/29/2011   Hypokalemia 08/29/2011   PCP:  Joaquin Mulberry, MD Pharmacy:   [facility] [address], [address] [place] [address] Phone: [phone] Fax: [phone]  [facility] [address] [place] [address] Phone: [phone] Fax: [phone]     Social Drivers of Health (SDOH) Social History: SDOH Screenings   Food Insecurity: No Food Insecurity (05/31/2023)  Housing: Low Risk  (05/31/2023)  Transportation Needs: No Transportation Needs (05/31/2023)  Utilities: Not At Risk (05/31/2023)  Alcohol Screen: Low Risk  (12/05/2021)  Depression (PHQ2-9): Low Risk  (12/05/2021)  Financial Resource Strain: Low Risk  (12/05/2021)  Physical Activity: Insufficiently Active (12/05/2021)  Social Connections: Moderately Isolated (05/31/2023)  Stress: No Stress Concern Present (12/05/2021)  Tobacco Use: High Risk (10/29/2022)   SDOH Interventions:     Readmission Risk Interventions     No  data to display

## 2023-05-31 NOTE — Care Management Obs Status (Signed)
 MEDICARE OBSERVATION STATUS NOTIFICATION   Patient Details  Name: ALEYLA SAMAND MRN: [id number] Date of Birth: [birth year]   Medicare Observation Status Notification Given:  Yes    Gertha Ku, LCSW 05/31/2023, 2:42 PM

## 2023-05-31 NOTE — ED Notes (Signed)
 Pt is much more awake and alert. She requested to go to the restroom; ambulated to restroom with RN standby assist w/o difficulty. Pt states she does not remember why she came to the hospital and does not remember her family visiting earlier, but she is aware that they had plans to eat together on Sunday (day of arrival). She is answering most questions appropriately but is still confused. Returned pt to bed and activated bed alarm for pt safety. Raised both side rails for safety and provided blankets for comfort. NAD noted throughout.

## 2023-06-01 ENCOUNTER — Other Ambulatory Visit (HOSPITAL_COMMUNITY): Payer: Self-pay

## 2023-06-01 ENCOUNTER — Encounter (HOSPITAL_COMMUNITY): Payer: Self-pay | Admitting: Internal Medicine

## 2023-06-01 DIAGNOSIS — G9340 Encephalopathy, unspecified: Secondary | ICD-10-CM | POA: Diagnosis not present

## 2023-06-01 DIAGNOSIS — G9341 Metabolic encephalopathy: Secondary | ICD-10-CM | POA: Diagnosis not present

## 2023-06-01 LAB — T.PALLIDUM AB, TOTAL: T Pallidum Abs: REACTIVE — AB

## 2023-06-01 LAB — URINE CULTURE: Culture: NO GROWTH

## 2023-06-01 MED ORDER — BUPROPION HCL ER (SR) 150 MG PO TB12
150.0000 mg | ORAL_TABLET | Freq: Two times a day (BID) | ORAL | 3 refills | Status: DC
  Filled 2023-06-01: qty 60, 30d supply, fill #0

## 2023-06-01 MED ORDER — POTASSIUM CHLORIDE CRYS ER 20 MEQ PO TBCR
40.0000 meq | EXTENDED_RELEASE_TABLET | Freq: Once | ORAL | Status: AC
  Administered 2023-06-01: 40 meq via ORAL
  Filled 2023-06-01: qty 2

## 2023-06-01 MED ORDER — AMLODIPINE BESYLATE 10 MG PO TABS
ORAL_TABLET | Freq: Every day | ORAL | 6 refills | Status: DC
  Filled 2023-06-01: qty 30, 30d supply, fill #0
  Filled 2023-06-25: qty 30, 30d supply, fill #1
  Filled 2023-08-04: qty 30, 30d supply, fill #2
  Filled 2023-10-07: qty 30, 30d supply, fill #3

## 2023-06-01 MED ORDER — CARVEDILOL 25 MG PO TABS
ORAL_TABLET | Freq: Two times a day (BID) | ORAL | 1 refills | Status: DC
  Filled 2023-06-01: qty 180, 90d supply, fill #0

## 2023-06-01 MED ORDER — ATORVASTATIN CALCIUM 20 MG PO TABS
20.0000 mg | ORAL_TABLET | Freq: Every day | ORAL | 3 refills | Status: DC
  Filled 2023-06-01: qty 30, 30d supply, fill #0
  Filled 2023-07-28: qty 30, 30d supply, fill #1
  Filled 2023-11-23: qty 30, 30d supply, fill #2

## 2023-06-01 NOTE — Discharge Summary (Signed)
 Physician Discharge Summary  Raven Buckley MVH:[id number] DOB: 1955/11/27 DOA: 05/30/2023  PCP: Joaquin Mulberry, MD  Admit date: 05/30/2023 Discharge date: 06/01/2023  Admitted From: Home Disposition:  Home  Recommendations for Outpatient Follow-up:  Follow up with PCP in 1-2 weeks Please obtain BMP/CBC in one week   Home Health:Yes Equipment/Devices:None  Discharge Condition:Stable CODE STATUS:Full Diet recommendation: Heart Healthy  Brief/Interim Summary: 68 y.o. female past medical history significant for essential hypertension hyperlipidemia marijuana use comes to the ED for altered mental status problem started 2 days prior to admission the family called me through police department and found her in the bathroom floor and agitated   Discharge Diagnoses:  Principal Problem:   Acute encephalopathy Active Problems:   Hypokalemia   Hypertension   Prolonged Q-T interval on ECG  Acute metabolic encephalopathy: Imaging was unremarkable UA and imaging showed no evidence of infection. Blood work was unremarkable except for UDS was positive for cannabis. Question of encephalopathy is likely due to cannabis. Ammonia level was unremarkable TSH was 0.8.  Hypokalemia/prolonged QTc: Potassium was repleted orally now improved.  Essential hypertension: She will continue Norvasc  and Coreg  as an outpatient and blood pressure is actually well-controlled. ACE inhibitor and diuretic therapy then will have to be resumed as an outpatient by her PCP were held.  Hyperlipidemia: Continue statins.  Discharge Instructions  Discharge Instructions     Diet - low sodium heart healthy   Complete by: As directed    Increase activity slowly   Complete by: As directed       Allergies as of 06/01/2023       Reactions   Penicillins Other (See Comments)    pt stated: "I don't know it just bothers me but I did not have a rash or swelling"        Medication List     STOP taking these  medications    lisinopril -hydrochlorothiazide  20-12.5 MG tablet Commonly known as: Zestoretic    nicotine  14 mg/24hr patch Commonly known as: NICODERM CQ  - dosed in mg/24 hours   PARoxetine  20 MG tablet Commonly known as: Paxil    polyethylene glycol powder 17 GM/SCOOP powder Commonly known as: GLYCOLAX /MIRALAX        TAKE these medications    amLODipine  10 MG tablet Commonly known as: NORVASC  TAKE 1 TABLET (10 MG TOTAL) BY MOUTH DAILY.   atorvastatin  20 MG tablet Commonly known as: LIPITOR Take 1 tablet (20 mg total) by mouth daily.   buPROPion  150 MG 12 hr tablet Commonly known as: Wellbutrin  SR Take 1 tablet (150 mg total) by mouth 2 (two) times daily. For smoking cessation   carvedilol  25 MG tablet Commonly known as: COREG  TAKE 1 TABLET (25 MG TOTAL) BY MOUTH 2 (TWO) TIMES DAILY WITH A MEAL.   cetirizine  10 MG tablet Commonly known as: ZYRTEC  Take 1 tablet (10 mg total) by mouth daily.   diclofenac  Sodium 1 % Gel Commonly known as: VOLTAREN  Apply 4 g topically 4 (four) times daily.        Allergies  Allergen Reactions   Penicillins Other (See Comments)     pt stated: "I don't know it just bothers me but I did not have a rash or swelling"      Consultations: None   Procedures/Studies: DG Chest Portable 1 View Result Date: 05/30/2023 CLINICAL DATA:  Chest pain.  Found down. EXAM: PORTABLE CHEST 1 VIEW COMPARISON:  01/07/2016 FINDINGS: Examination is limited by clothing artifact. The cardiac silhouette, mediastinal and hilar contours are  within normal limits given the AP projection, portable technique and semi upright position of the patient. No acute pulmonary findings. No pleural effusions or pulmonary lesions. The bony thorax is intact. IMPRESSION: No acute cardiopulmonary findings. Electronically Signed   By: Marrian Siva M.D.   On: 05/30/2023 22:02   CT Head Wo Contrast Result Date: 05/30/2023 CLINICAL DATA:  Altered level of consciousness, headache  EXAM: CT HEAD WITHOUT CONTRAST TECHNIQUE: Contiguous axial images were obtained from the base of the skull through the vertex without intravenous contrast. RADIATION DOSE REDUCTION: This exam was performed according to the departmental dose-optimization program which includes automated exposure control, adjustment of the mA and/or kV according to patient size and/or use of iterative reconstruction technique. COMPARISON:  05/30/2023, 03/18/2019 FINDINGS: Brain: No acute infarct or hemorrhage. Lateral ventricles and midline structures are unremarkable. No acute extra-axial fluid collections. No mass effect. Vascular: No hyperdense vessel or unexpected calcification. Skull: Normal. Negative for fracture or focal lesion. Sinuses/Orbits: No acute finding. Other: None. IMPRESSION: 1. No acute intracranial process. Electronically Signed   By: Bobbye Burrow M.D.   On: 05/30/2023 21:37   CT Head Wo Contrast Result Date: 05/30/2023 CLINICAL DATA:  Headache, new onset (Age >= 68y) EXAM: CT HEAD WITHOUT CONTRAST TECHNIQUE: Contiguous axial images were obtained from the base of the skull through the vertex without intravenous contrast. RADIATION DOSE REDUCTION: This exam was performed according to the departmental dose-optimization program which includes automated exposure control, adjustment of the mA and/or kV according to patient size and/or use of iterative reconstruction technique. COMPARISON:  CT head 03/18/2019 FINDINGS: Brain: Limited evaluation due to motion artifact. No definite evidence of large-territorial acute infarction. No definite parenchymal hemorrhage. No gross mass lesion. No definite extra-axial collection. No mass effect or midline shift. No hydrocephalus. Basilar cisterns are patent. Vascular: No hyperdense vessel. Skull: Limited evaluation. Sinuses/Orbits: Paranasal sinuses and mastoid air cells are clear. The orbits are unremarkable. Other: None. IMPRESSION: Limited evaluation due to motion artifact.   Recommend repeat. Electronically Signed   By: Morgane  Naveau M.D.   On: 05/30/2023 20:37     Subjective: No complaints  Discharge Exam: Vitals:   05/31/23 2001 06/01/23 0611  BP: (!) 146/84 133/65  Pulse: 67 64  Resp: 20 16  Temp: 99 F (37.2 C) 98.3 F (36.8 C)  SpO2: 100% 99%   Vitals:   05/31/23 1256 05/31/23 1704 05/31/23 2001 06/01/23 0611  BP: 119/71 (!) 155/89 (!) 146/84 133/65  Pulse: 67 (!) 59 67 64  Resp:  19 20 16   Temp: 97.9 F (36.6 C) 98.7 F (37.1 C) 99 F (37.2 C) 98.3 F (36.8 C)  TempSrc: Oral  Oral Oral  SpO2: 100% 98% 100% 99%  Weight:      Height:        General: Pt is alert, awake, not in acute distress Cardiovascular: RRR, S1/S2 +, no rubs, no gallops Respiratory: CTA bilaterally, no wheezing, no rhonchi Abdominal: Soft, NT, ND, bowel sounds + Extremities: no edema, no cyanosis    The results of significant diagnostics from this hospitalization (including imaging, microbiology, ancillary and laboratory) are listed below for reference.     Microbiology: Recent Results (from the past 240 hours)  Culture, blood (routine x 2)     Status: Abnormal (Preliminary result)   Collection Time: 05/30/23  7:50 PM   Specimen: BLOOD  Result Value Ref Range Status   Specimen Description   Final    BLOOD LEFT ANTECUBITAL Performed at [facility], [address], [place], [place] [address]    Special Requests   Final    BOTTLES DRAWN AEROBIC AND ANAEROBIC Blood Culture results may not be optimal due to an inadequate volume of blood received in culture bottles   Culture  Setup Time   Final    GRAM POSITIVE COCCI IN CLUSTERS IN BOTH AEROBIC AND ANAEROBIC BOTTLES CRITICAL RESULT CALLED TO, READ BACK BY AND VERIFIED WITH: PHARMD J. LEGGE [id number] @ 1703 FH    Culture (A)  Final    STAPHYLOCOCCUS EPIDERMIDIS THE SIGNIFICANCE OF ISOLATING THIS ORGANISM FROM A SINGLE SET OF BLOOD CULTURES WHEN MULTIPLE SETS ARE DRAWN IS UNCERTAIN.  PLEASE NOTIFY THE MICROBIOLOGY DEPARTMENT WITHIN ONE WEEK IF SPECIATION AND SENSITIVITIES ARE REQUIRED. Performed at [facility], [address] [place], [place] [address]    Report Status PENDING  Incomplete  Culture, blood (routine x 2)     Status: None (Preliminary result)   Collection Time: 05/30/23  7:50 PM   Specimen: BLOOD  Result Value Ref Range Status   Specimen Description   Final    BLOOD RIGHT ANTECUBITAL Performed at [facility], [address] [place], [place] [address]    Special Requests   Final    BOTTLES DRAWN AEROBIC AND ANAEROBIC Blood Culture adequate volume   Culture   Final    NO GROWTH 2 DAYS Performed at [facility], [address], [place], [place] [address]    Report Status PENDING  Incomplete  Blood Culture ID Panel (Reflexed)     Status: Abnormal   Collection Time: 05/30/23  7:50 PM  Result Value Ref Range Status   Enterococcus faecalis NOT DETECTED NOT DETECTED Final   Enterococcus Faecium NOT DETECTED NOT DETECTED Final   Listeria monocytogenes NOT DETECTED NOT DETECTED Final   Staphylococcus species DETECTED (A) NOT DETECTED Final    Comment: CRITICAL RESULT CALLED TO, READ BACK BY AND VERIFIED WITH: PHARMD J. LEGGE [id number] @ 1703 FH    Staphylococcus aureus (BCID) NOT DETECTED NOT DETECTED Final   Staphylococcus epidermidis DETECTED (A) NOT DETECTED Final    Comment: CRITICAL RESULT CALLED TO, READ BACK BY AND VERIFIED WITH: PHARMD J. LEGGE [id number] @ 1703 FH    Staphylococcus lugdunensis NOT DETECTED NOT DETECTED Final   Streptococcus species NOT DETECTED NOT DETECTED Final   Streptococcus agalactiae NOT DETECTED NOT DETECTED Final   Streptococcus pneumoniae NOT DETECTED NOT DETECTED Final   Streptococcus pyogenes NOT DETECTED NOT DETECTED Final   A.calcoaceticus-baumannii NOT DETECTED NOT DETECTED Final   Bacteroides fragilis NOT DETECTED NOT DETECTED Final   Enterobacterales NOT DETECTED NOT DETECTED Final    Enterobacter cloacae complex NOT DETECTED NOT DETECTED Final   Escherichia coli NOT DETECTED NOT DETECTED Final   Klebsiella aerogenes NOT DETECTED NOT DETECTED Final   Klebsiella oxytoca NOT DETECTED NOT DETECTED Final   Klebsiella pneumoniae NOT DETECTED NOT DETECTED Final   Proteus species NOT DETECTED NOT DETECTED Final   Salmonella species NOT DETECTED NOT DETECTED Final   Serratia marcescens NOT DETECTED NOT DETECTED Final   Haemophilus influenzae NOT DETECTED NOT DETECTED Final   Neisseria meningitidis NOT DETECTED NOT DETECTED Final   Pseudomonas aeruginosa NOT DETECTED NOT DETECTED Final   Stenotrophomonas maltophilia NOT DETECTED NOT DETECTED Final   Candida albicans NOT DETECTED NOT DETECTED Final   Candida auris NOT DETECTED NOT DETECTED Final   Candida glabrata NOT DETECTED NOT DETECTED Final   Candida krusei NOT  DETECTED NOT DETECTED Final   Candida parapsilosis NOT DETECTED NOT DETECTED Final   Candida tropicalis NOT DETECTED NOT DETECTED Final   Cryptococcus neoformans/gattii NOT DETECTED NOT DETECTED Final   Methicillin resistance mecA/C NOT DETECTED NOT DETECTED Final    Comment: Performed at [facility], [address] [place], [place] [address]  Urine Culture     Status: None   Collection Time: 05/30/23  7:51 PM   Specimen: Urine, Random  Result Value Ref Range Status   Specimen Description   Final    URINE, RANDOM Performed at [facility], [address], [place], [place] [address]    Special Requests   Final    NONE Reflexed from [id number] Performed at [facility], [address] [place], [place] [address]    Culture   Final    NO GROWTH Performed at [facility], [address], [place], [place] [address]    Report Status 06/01/2023 FINAL  Final     Labs: BNP (last 3 results) No results for input(s): "BNP" in the last 8760 hours. Basic Metabolic Panel: Recent Labs  Lab 05/30/23 1959  05/31/23 0535  NA 135 135  K 3.3* 3.3*  CL 96* 101  CO2 25 23  GLUCOSE 150* 118*  BUN 12 12  CREATININE 0.83 0.60  CALCIUM  9.6 8.9  MG 1.9  --    Liver Function Tests: Recent Labs  Lab 05/30/23 1959  AST 27  ALT 14  ALKPHOS 100  BILITOT 1.1  PROT 8.7*  ALBUMIN 4.4   No results for input(s): "LIPASE", "AMYLASE" in the last 168 hours. Recent Labs  Lab 05/30/23 1958  AMMONIA 21   CBC: Recent Labs  Lab 05/30/23 1959 05/31/23 0535  WBC 10.5 8.4  HGB 13.9 12.8  HCT 42.1 39.1  MCV 90.7 90.3  PLT 392 348   Cardiac Enzymes: Recent Labs  Lab 05/30/23 2012  CKTOTAL 192   BNP: Invalid input(s): "POCBNP" CBG: No results for input(s): "GLUCAP" in the last 168 hours. D-Dimer No results for input(s): "DDIMER" in the last 72 hours. Hgb A1c No results for input(s): "HGBA1C" in the last 72 hours. Lipid Profile No results for input(s): "CHOL", "HDL", "LDLCALC", "TRIG", "CHOLHDL", "LDLDIRECT" in the last 72 hours. Thyroid function studies Recent Labs    05/30/23 1959  TSH 0.872   Anemia work up Recent Labs    05/31/23 0535  VITAMINB12 158*   Urinalysis    Component Value Date/Time   COLORURINE YELLOW 05/30/2023 1951   APPEARANCEUR CLOUDY (A) 05/30/2023 1951   LABSPEC 1.020 05/30/2023 1951   PHURINE 5.0 05/30/2023 1951   GLUCOSEU NEGATIVE 05/30/2023 1951   HGBUR MODERATE (A) 05/30/2023 1951   BILIRUBINUR NEGATIVE 05/30/2023 1951   KETONESUR 5 (A) 05/30/2023 1951   PROTEINUR 100 (A) 05/30/2023 1951   UROBILINOGEN 0.2 08/28/2011 2143   NITRITE NEGATIVE 05/30/2023 1951   LEUKOCYTESUR TRACE (A) 05/30/2023 1951   Sepsis Labs Recent Labs  Lab 05/30/23 1959 05/31/23 0535  WBC 10.5 8.4   Microbiology Recent Results (from the past 240 hours)  Culture, blood (routine x 2)     Status: Abnormal (Preliminary result)   Collection Time: 05/30/23  7:50 PM   Specimen: BLOOD  Result Value Ref Range Status   Specimen Description   Final    BLOOD LEFT  ANTECUBITAL Performed at [facility], [address], [place], [place] [address]    Special Requests  Final    BOTTLES DRAWN AEROBIC AND ANAEROBIC Blood Culture results may not be optimal due to an inadequate volume of blood received in culture bottles   Culture  Setup Time   Final    GRAM POSITIVE COCCI IN CLUSTERS IN BOTH AEROBIC AND ANAEROBIC BOTTLES CRITICAL RESULT CALLED TO, READ BACK BY AND VERIFIED WITH: PHARMD J. LEGGE [id number] @ 1703 FH    Culture (A)  Final    STAPHYLOCOCCUS EPIDERMIDIS THE SIGNIFICANCE OF ISOLATING THIS ORGANISM FROM A SINGLE SET OF BLOOD CULTURES WHEN MULTIPLE SETS ARE DRAWN IS UNCERTAIN. PLEASE NOTIFY THE MICROBIOLOGY DEPARTMENT WITHIN ONE WEEK IF SPECIATION AND SENSITIVITIES ARE REQUIRED. Performed at [facility], [address], [place], [place] [address]    Report Status PENDING  Incomplete  Culture, blood (routine x 2)     Status: None (Preliminary result)   Collection Time: 05/30/23  7:50 PM   Specimen: BLOOD  Result Value Ref Range Status   Specimen Description   Final    BLOOD RIGHT ANTECUBITAL Performed at [facility], [address], [place], [place] [address]    Special Requests   Final    BOTTLES DRAWN AEROBIC AND ANAEROBIC Blood Culture adequate volume   Culture   Final    NO GROWTH 2 DAYS Performed at [facility], [address], [place], [place] [address]    Report Status PENDING  Incomplete  Blood Culture ID Panel (Reflexed)     Status: Abnormal   Collection Time: 05/30/23  7:50 PM  Result Value Ref Range Status   Enterococcus faecalis NOT DETECTED NOT DETECTED Final   Enterococcus Faecium NOT DETECTED NOT DETECTED Final   Listeria monocytogenes NOT DETECTED NOT DETECTED Final   Staphylococcus species DETECTED (A) NOT DETECTED Final    Comment: CRITICAL RESULT CALLED TO, READ BACK BY AND VERIFIED WITH: PHARMD J. LEGGE [id number] @ 1703 FH    Staphylococcus aureus (BCID) NOT DETECTED  NOT DETECTED Final   Staphylococcus epidermidis DETECTED (A) NOT DETECTED Final    Comment: CRITICAL RESULT CALLED TO, READ BACK BY AND VERIFIED WITH: PHARMD J. LEGGE [id number] @ 1703 FH    Staphylococcus lugdunensis NOT DETECTED NOT DETECTED Final   Streptococcus species NOT DETECTED NOT DETECTED Final   Streptococcus agalactiae NOT DETECTED NOT DETECTED Final   Streptococcus pneumoniae NOT DETECTED NOT DETECTED Final   Streptococcus pyogenes NOT DETECTED NOT DETECTED Final   A.calcoaceticus-baumannii NOT DETECTED NOT DETECTED Final   Bacteroides fragilis NOT DETECTED NOT DETECTED Final   Enterobacterales NOT DETECTED NOT DETECTED Final   Enterobacter cloacae complex NOT DETECTED NOT DETECTED Final   Escherichia coli NOT DETECTED NOT DETECTED Final   Klebsiella aerogenes NOT DETECTED NOT DETECTED Final   Klebsiella oxytoca NOT DETECTED NOT DETECTED Final   Klebsiella pneumoniae NOT DETECTED NOT DETECTED Final   Proteus species NOT DETECTED NOT DETECTED Final   Salmonella species NOT DETECTED NOT DETECTED Final   Serratia marcescens NOT DETECTED NOT DETECTED Final   Haemophilus influenzae NOT DETECTED NOT DETECTED Final   Neisseria meningitidis NOT DETECTED NOT DETECTED Final   Pseudomonas aeruginosa NOT DETECTED NOT DETECTED Final   Stenotrophomonas maltophilia NOT DETECTED NOT DETECTED Final   Candida albicans NOT DETECTED NOT DETECTED Final   Candida auris NOT DETECTED NOT DETECTED Final   Candida glabrata NOT DETECTED NOT DETECTED Final   Candida krusei NOT DETECTED NOT DETECTED Final   Candida parapsilosis NOT DETECTED NOT DETECTED Final   Candida tropicalis  NOT DETECTED NOT DETECTED Final   Cryptococcus neoformans/gattii NOT DETECTED NOT DETECTED Final   Methicillin resistance mecA/C NOT DETECTED NOT DETECTED Final    Comment: Performed at [facility], [address], [place], [place] [address]  Urine Culture     Status: None   Collection Time: 05/30/23  7:51 PM    Specimen: Urine, Random  Result Value Ref Range Status   Specimen Description   Final    URINE, RANDOM Performed at [facility], [address], [place], [place] [address]    Special Requests   Final    NONE Reflexed from [id number] Performed at [facility], [address], [place], [place] [address]    Culture   Final    NO GROWTH Performed at [facility], [address], [place], [place] [address]    Report Status 06/01/2023 FINAL  Final     SIGNED:   Macdonald Savoy, MD  [facility] 06/01/2023, 9:52 AM Pager   If 7PM-7AM, please contact night-coverage [email or web address] Password [email or web address]

## 2023-06-01 NOTE — Progress Notes (Signed)
 TOC meds in a secure bag delivered to pt  in room-placed with patient belongings

## 2023-06-02 ENCOUNTER — Telehealth: Payer: Self-pay

## 2023-06-02 LAB — CULTURE, BLOOD (ROUTINE X 2)

## 2023-06-02 NOTE — Transitions of Care (Post Inpatient/ED Visit) (Signed)
   06/02/2023  Name: ADEENA APGAR MRN: [id number] DOB: 11-Nov-1955  Today's TOC FU Call Status: Today's TOC FU Call Status:: Unsuccessful Call (1st Attempt) Unsuccessful Call (1st Attempt) Date: 06/02/23  Attempted to reach the patient regarding the most recent Inpatient/ED visit.  Follow Up Plan: Additional outreach attempts will be made to reach the patient to complete the Transitions of Care (Post Inpatient/ED visit) call.   Signature  Burnett Carson, RN

## 2023-06-03 ENCOUNTER — Telehealth: Payer: Self-pay

## 2023-06-03 NOTE — Transitions of Care (Post Inpatient/ED Visit) (Signed)
   06/03/2023  Name: Raven Buckley MRN: [id number] DOB: 1956/01/28  Today's TOC FU Call Status: Today's TOC FU Call Status:: Unsuccessful Call (3rd Attempt) Unsuccessful Call (1st Attempt) Date: 06/02/23 Unsuccessful Call (2nd Attempt) Date: 06/03/23 Unsuccessful Call (3rd Attempt) Date: 06/03/23  Attempted to reach the patient regarding the most recent Inpatient/ED visit.  Follow Up Plan: No further outreach attempts will be made at this time. We have been unable to contact the patient.  The 3rd attempt was made this afternoon as the patient requested.  Letter sent to her requesting she contact [facility] to schedule a follow up appointment as we have not been able to reach her.  If she calls back, we need to schedule a hospital follow up appointment for her.  She has not seen Dr Adan Holms since 11/2021.   Signature  Burnett Carson, RN

## 2023-06-03 NOTE — Transitions of Care (Post Inpatient/ED Visit) (Signed)
   06/03/2023  Name: Raven Buckley MRN: [id number] DOB: 01-18-1956  Today's TOC FU Call Status: Today's TOC FU Call Status:: Unsuccessful Call (2nd Attempt) Unsuccessful Call (1st Attempt) Date: 06/02/23 Unsuccessful Call (2nd Attempt) Date: 06/03/23  Attempted to reach the patient regarding the most recent Inpatient/ED visit.  Follow Up Plan: Additional outreach attempts will be made to reach the patient to complete the Transitions of Care (Post Inpatient/ED visit) call.   Signature Burnett Carson, RN

## 2023-06-04 LAB — CULTURE, BLOOD (ROUTINE X 2)
Culture: NO GROWTH
Special Requests: ADEQUATE

## 2023-06-07 ENCOUNTER — Telehealth: Payer: Self-pay

## 2023-06-07 NOTE — Telephone Encounter (Signed)
 Copied from CRM [id number]. Topic: Appointments - Scheduling Inquiry for Clinic >> Jun 02, 2023  4:58 PM Raven Buckley wrote: Reason for CRM: Patient requests to schedule hospital follow up appointment but there were no appointments available within 2 weeks of discharge. Please contact patient to schedule hospital follow up. Call back# [phone]

## 2023-06-23 ENCOUNTER — Emergency Department (HOSPITAL_COMMUNITY)
Admission: EM | Admit: 2023-06-23 | Discharge: 2023-06-23 | Disposition: A | Discharge status: Home / Self Care | Attending: Emergency Medicine | Admitting: Emergency Medicine

## 2023-06-23 ENCOUNTER — Ambulatory Visit: Attending: Family Medicine | Admitting: Family Medicine

## 2023-06-23 ENCOUNTER — Encounter: Payer: Self-pay | Admitting: Family Medicine

## 2023-06-23 VITALS — BP 223/113 | HR 84 | Ht 59.0 in | Wt 114.2 lb

## 2023-06-23 DIAGNOSIS — R519 Headache, unspecified: Secondary | ICD-10-CM | POA: Diagnosis not present

## 2023-06-23 DIAGNOSIS — I161 Hypertensive emergency: Secondary | ICD-10-CM | POA: Diagnosis not present

## 2023-06-23 DIAGNOSIS — R112 Nausea with vomiting, unspecified: Secondary | ICD-10-CM | POA: Diagnosis not present

## 2023-06-23 DIAGNOSIS — I10 Essential (primary) hypertension: Secondary | ICD-10-CM | POA: Diagnosis present

## 2023-06-23 DIAGNOSIS — Z79899 Other long term (current) drug therapy: Secondary | ICD-10-CM | POA: Insufficient documentation

## 2023-06-23 LAB — CBC WITH DIFFERENTIAL/PLATELET
Abs Immature Granulocytes: 0.01 10*3/uL (ref 0.00–0.07)
Basophils Absolute: 0 10*3/uL (ref 0.0–0.1)
Basophils Relative: 0 %
Eosinophils Absolute: 0 10*3/uL (ref 0.0–0.5)
Eosinophils Relative: 0 %
HCT: 42 % (ref 36.0–46.0)
Hemoglobin: 13.7 g/dL (ref 12.0–15.0)
Immature Granulocytes: 0 %
Lymphocytes Relative: 16 %
Lymphs Abs: 0.9 10*3/uL (ref 0.7–4.0)
MCH: 29.8 pg (ref 26.0–34.0)
MCHC: 32.6 g/dL (ref 30.0–36.0)
MCV: 91.5 fL (ref 80.0–100.0)
Monocytes Absolute: 0.2 10*3/uL (ref 0.1–1.0)
Monocytes Relative: 4 %
Neutro Abs: 4.4 10*3/uL (ref 1.7–7.7)
Neutrophils Relative %: 80 %
Platelets: 424 10*3/uL — ABNORMAL HIGH (ref 150–400)
RBC: 4.59 MIL/uL (ref 3.87–5.11)
RDW: 12.9 % (ref 11.5–15.5)
WBC: 5.5 10*3/uL (ref 4.0–10.5)
nRBC: 0 % (ref 0.0–0.2)

## 2023-06-23 LAB — BASIC METABOLIC PANEL WITH GFR
Anion gap: 10 (ref 5–15)
BUN: 7 mg/dL — ABNORMAL LOW (ref 8–23)
CO2: 24 mmol/L (ref 22–32)
Calcium: 9.7 mg/dL (ref 8.9–10.3)
Chloride: 101 mmol/L (ref 98–111)
Creatinine, Ser: 0.5 mg/dL (ref 0.44–1.00)
GFR, Estimated: >60 mL/min (ref >60)
Glucose, Bld: 110 mg/dL — ABNORMAL HIGH (ref 70–99)
Potassium: 3.7 mmol/L (ref 3.5–5.1)
Sodium: 135 mmol/L (ref 135–145)

## 2023-06-23 MED ORDER — HYDRALAZINE HCL 20 MG/ML IJ SOLN
5.0000 mg | Freq: Once | INTRAMUSCULAR | Status: AC
  Administered 2023-06-23: 5 mg via INTRAVENOUS
  Filled 2023-06-23: qty 1

## 2023-06-23 MED ORDER — AMLODIPINE BESYLATE 5 MG PO TABS
10.0000 mg | ORAL_TABLET | Freq: Every day | ORAL | Status: DC
  Administered 2023-06-23: 10 mg via ORAL
  Filled 2023-06-23: qty 2

## 2023-06-23 MED ORDER — HYDRALAZINE HCL 20 MG/ML IJ SOLN
10.0000 mg | Freq: Once | INTRAMUSCULAR | Status: DC
Start: 2023-06-23 — End: 2023-06-23

## 2023-06-23 MED ORDER — CARVEDILOL 12.5 MG PO TABS
25.0000 mg | ORAL_TABLET | ORAL | Status: AC
  Administered 2023-06-23: 25 mg via ORAL
  Filled 2023-06-23: qty 2

## 2023-06-23 NOTE — Patient Instructions (Signed)
 I am referring you to the emergency room right away due to your hypertensive emergency as your blood pressure is significantly elevated and you have a headache with nausea and vomiting.  You will need further evaluation at the emergency room.

## 2023-06-23 NOTE — Discharge Instructions (Signed)
 Take your blood pressure medication as directed.

## 2023-06-23 NOTE — Progress Notes (Signed)
 Subjective:  Patient ID: Raven Buckley, female    DOB: 08/02/1955  Age: 68 y.o. MRN: [id number]  CC: Hospitalization Follow-up (headache)     Discussed the use of AI scribe software for clinical note transcription with the patient, who gave verbal consent to proceed.  History of Present Illness Raven Buckley is a 68 year old female with a history of hypertension, nicotine  dependence who presents with a headache and elevated blood pressure. Hospitalized for acute encephalopathy, accelerated hypertension last month. Accompanied by her brother to this visit.  She was recently found unconscious in her apartment after family had called the police department.  Labs had revealed UDS positive for cannabis and she did have electrolyte derangements which were corrected.  Her medication regimen was adjusted and lisinopril /HCTZ discontinued while Norvasc  and Coreg  continued.    She has a history of inconsistent medication adherence, currently taking amlodipine  and carvedilol  but not her previously  prescribed lisinopril / hydrochlorothiazide . Her blood pressure is significantly elevated at 223/113, accompanied by headache.  While I was speaking to her she developed nausea and vomiting and was vomiting throughout the encounter.  She denies blurry vision, chest pain, shortness of breath, or tingling in her arms.     Past Medical History:  Diagnosis Date   Hypertension     No past surgical history on file.  No family history on file.  Social History   Socioeconomic History   Marital status: Single    Spouse name: Not on file   Number of children: Not on file   Years of education: Not on file   Highest education level: Not on file  Occupational History   Not on file  Tobacco Use   Smoking status: Every Day    Current packs/day: 0.50    Types: Cigarettes   Smokeless tobacco: Never  Substance and Sexual Activity   Alcohol use: No   Drug use: Yes    Types: Marijuana   Sexual activity:  Not on file  Other Topics Concern   Not on file  Social History Narrative   Not on file   Social Drivers of Health   Financial Resource Strain: Low Risk  (12/05/2021)   Overall Financial Resource Strain (CARDIA)    Difficulty of Paying Living Expenses: Not hard at all  Food Insecurity: No Food Insecurity (05/31/2023)   Hunger Vital Sign    Worried About Running Out of Food in the Last Year: Never true    Ran Out of Food in the Last Year: Never true  Transportation Needs: No Transportation Needs (05/31/2023)   PRAPARE - Administrator, Civil Service (Medical): No    Lack of Transportation (Non-Medical): No  Physical Activity: Insufficiently Active (12/05/2021)   Exercise Vital Sign    Days of Exercise per Week: 7 days    Minutes of Exercise per Session: 20 min  Stress: No Stress Concern Present (12/05/2021)   [employer] of Occupational Health - Occupational Stress Questionnaire    Feeling of Stress : Not at all  Social Connections: Moderately Isolated (05/31/2023)   Social Connection and Isolation Panel [NHANES]    Frequency of Communication with Friends and Family: Once a week    Frequency of Social Gatherings with Friends and Family: Once a week    Attends Religious Services: More than 4 times per year    Active Member of [employer] or Organizations: Yes    Attends Banker Meetings: More than 4 times per year  Marital Status: Never married    Allergies  Allergen Reactions   Penicillins Other (See Comments)     pt stated: "I don't know it just bothers me but I did not have a rash or swelling"      Outpatient Medications Prior to Visit  Medication Sig Dispense Refill   amLODipine  (NORVASC ) 10 MG tablet TAKE 1 TABLET (10 MG TOTAL) BY MOUTH DAILY. 30 tablet 6   atorvastatin  (LIPITOR) 20 MG tablet Take 1 tablet (20 mg total) by mouth daily. 30 tablet 3   buPROPion  (WELLBUTRIN  SR) 150 MG 12 hr tablet Take 1 tablet (150 mg total) by mouth 2 (two) times  daily. For smoking cessation 60 tablet 3   carvedilol  (COREG ) 25 MG tablet TAKE 1 TABLET (25 MG TOTAL) BY MOUTH 2 (TWO) TIMES DAILY WITH A MEAL. 180 tablet 1   diclofenac  Sodium (VOLTAREN ) 1 % GEL Apply 4 g topically 4 (four) times daily. 100 g 0   No facility-administered medications prior to visit.     ROS Review of Systems  Constitutional:  Negative for activity change and appetite change.  HENT:  Negative for sinus pressure and sore throat.   Respiratory:  Negative for chest tightness, shortness of breath and wheezing.   Cardiovascular:  Negative for chest pain and palpitations.  Gastrointestinal:  Positive for nausea and vomiting. Negative for abdominal distention, abdominal pain and constipation.  Genitourinary: Negative.   Musculoskeletal: Negative.   Neurological:  Positive for headaches.  Psychiatric/Behavioral:  Negative for behavioral problems and dysphoric mood.     Objective:  BP (!) 223/113   Pulse 84   Ht 4\' 11"  (1.499 m)   Wt 114 lb 3.2 oz (51.8 kg)   SpO2 98%   BMI 23.07 kg/m      06/23/2023    3:44 PM 06/01/2023   12:33 PM 06/01/2023    6:11 AM  BP/Weight  Systolic BP 223 144 133  Diastolic BP 113 83 65  Wt. (Lbs) 114.2    BMI 23.07 kg/m2        Physical Exam Constitutional:      Appearance: She is well-developed.     Comments: Ill looking  Cardiovascular:     Rate and Rhythm: Normal rate.     Heart sounds: Normal heart sounds. No murmur heard. Pulmonary:     Effort: Pulmonary effort is normal.     Breath sounds: Normal breath sounds. No wheezing or rales.  Chest:     Chest wall: No tenderness.  Abdominal:     General: Bowel sounds are normal. There is no distension.     Palpations: Abdomen is soft. There is no mass.     Tenderness: There is no abdominal tenderness.  Musculoskeletal:        General: Normal range of motion.     Right lower leg: No edema.     Left lower leg: No edema.  Neurological:     Mental Status: She is oriented to  person, place, and time.     Comments: Somnolent  Psychiatric:        Mood and Affect: Mood normal.        Latest Ref Rng & Units 05/31/2023    5:35 AM 05/30/2023    7:59 PM 05/06/2021    9:10 AM  CMP  Glucose 70 - 99 mg/dL 161  096  045   BUN 8 - 23 mg/dL 12  12  7    Creatinine 0.44 - 1.00 mg/dL 4.09  0.83  0.68   Sodium 135 - 145 mmol/L 135  135  140   Potassium 3.5 - 5.1 mmol/L 3.3  3.3  4.1   Chloride 98 - 111 mmol/L 101  96  103   CO2 22 - 32 mmol/L 23  25  24    Calcium  8.9 - 10.3 mg/dL 8.9  9.6  9.5   Total Protein 6.5 - 8.1 g/dL  8.7  6.7   Total Bilirubin 0.0 - 1.2 mg/dL  1.1  0.2   Alkaline Phos 38 - 126 U/L  100  98   AST 15 - 41 U/L  27  17   ALT 0 - 44 U/L  14  9     Lipid Panel     Component Value Date/Time   CHOL 167 05/06/2021 0910   TRIG 61 05/06/2021 0910   HDL 45 05/06/2021 0910   CHOLHDL 4.3 03/18/2019 1857   VLDL 12 03/18/2019 1857   LDLCALC 110 (H) 05/06/2021 0910    CBC    Component Value Date/Time   WBC 8.4 05/31/2023 0535   RBC 4.33 05/31/2023 0535   HGB 12.8 05/31/2023 0535   HGB 11.9 05/06/2021 0910   HCT 39.1 05/31/2023 0535   HCT 36.2 05/06/2021 0910   PLT 348 05/31/2023 0535   PLT 344 05/06/2021 0910   MCV 90.3 05/31/2023 0535   MCV 89 05/06/2021 0910   MCH 29.6 05/31/2023 0535   MCHC 32.7 05/31/2023 0535   RDW 13.2 05/31/2023 0535   RDW 13.1 05/06/2021 0910   LYMPHSABS 2.4 05/06/2021 0910   MONOABS 0.5 01/07/2016 1716   EOSABS 0.1 05/06/2021 0910   BASOSABS 0.0 05/06/2021 0910    Lab Results  Component Value Date   HGBA1C 6.0 (H) 05/06/2021      1. Hypertensive emergency (Primary) Significantly elevated blood pressure but she endorses taking her amlodipine  and carvedilol  this morning.  Was previously on lisinopril /HCTZ 20/12.5 mg 2 tablets daily but this was discontinued during her hospitalization last month. EMS called to transport her to the ED. In the setting of ongoing headache and new onset nausea and vomiting in  the clinic I am referring her to the ED for further evaluation to exclude raised intracranial pressure and endorgan damage  2. Intractable headache, unspecified chronicity pattern, unspecified headache type See #1 above  3. Nausea and vomiting, unspecified vomiting type See #1 above   No orders of the defined types were placed in this encounter.   Follow-up: Return in about 2 weeks (around 07/07/2023) for Blood Pressure follow-up with PCP.       Joaquin Mulberry, MD, FAAFP. [facility] [place], [place] [phone]   06/23/2023, 4:12 PM

## 2023-06-23 NOTE — ED Triage Notes (Addendum)
 Pt BIBA from PCP for Hypertension. EMS reports pt may have forgotten to take her meds for several days.  Endorses Headache, Denies dizziness and chest pain.  BP 210/102 HR 80  100% on Room air  pt is A&O x 4

## 2023-06-23 NOTE — ED Provider Notes (Signed)
 [place] EMERGENCY DEPARTMENT AT [facility] Provider Note   CSN: [id number] Arrival date & time: 06/23/23  1635     History  Chief Complaint  Patient presents with   Hypertension    Raven Buckley is a 68 y.o. female.  68 year old female presents due to increased blood pressure.  States that she has not been compliant with her high blood pressure medication.  Denies any current chest pain.  Mild headache without emesis.  No focal neurological weakness.  Has not been dizzy.  Called EMS and blood pressure was 210/102.  Transported here       Home Medications Prior to Admission medications   Medication Sig Start Date End Date Taking? Authorizing Provider  amLODipine  (NORVASC ) 10 MG tablet TAKE 1 TABLET (10 MG TOTAL) BY MOUTH DAILY. 06/01/23 05/31/24  Macdonald Savoy, MD  atorvastatin  (LIPITOR) 20 MG tablet Take 1 tablet (20 mg total) by mouth daily. 06/01/23   Macdonald Savoy, MD  buPROPion  (WELLBUTRIN  SR) 150 MG 12 hr tablet Take 1 tablet (150 mg total) by mouth 2 (two) times daily. For smoking cessation 06/01/23   Macdonald Savoy, MD  carvedilol  (COREG ) 25 MG tablet TAKE 1 TABLET (25 MG TOTAL) BY MOUTH 2 (TWO) TIMES DAILY WITH A MEAL. 06/01/23 05/31/24  Macdonald Savoy, MD  diclofenac  Sodium (VOLTAREN ) 1 % GEL Apply 4 g topically 4 (four) times daily. 10/29/22   Albertus Hughs, DO      Allergies    Penicillins    Review of Systems   Review of Systems  All other systems reviewed and are negative.   Physical Exam Updated Vital Signs BP (!) 207/101   Pulse 73   Temp 98 F (36.7 C) (Oral)   Resp 18   SpO2 100%  Physical Exam Vitals and nursing note reviewed.  Constitutional:      General: She is not in acute distress.    Appearance: Normal appearance. She is well-developed. She is not toxic-appearing.  HENT:     Head: Normocephalic and atraumatic.  Eyes:     General: Lids are normal.     Conjunctiva/sclera: Conjunctivae normal.     Pupils:  Pupils are equal, round, and reactive to light.  Neck:     Thyroid : No thyroid  mass.     Trachea: No tracheal deviation.  Cardiovascular:     Rate and Rhythm: Normal rate and regular rhythm.     Heart sounds: Normal heart sounds. No murmur heard.    No gallop.  Pulmonary:     Effort: Pulmonary effort is normal. No respiratory distress.     Breath sounds: Normal breath sounds. No stridor. No decreased breath sounds, wheezing, rhonchi or rales.  Abdominal:     General: There is no distension.     Palpations: Abdomen is soft.     Tenderness: There is no abdominal tenderness. There is no rebound.  Musculoskeletal:        General: No tenderness. Normal range of motion.     Cervical back: Normal range of motion and neck supple.  Skin:    General: Skin is warm and dry.     Findings: No abrasion or rash.  Neurological:     Mental Status: She is alert and oriented to person, place, and time. Mental status is at baseline.     GCS: GCS eye subscore is 4. GCS verbal subscore is 5. GCS motor subscore is 6.     Cranial Nerves: No cranial nerve  deficit.     Sensory: No sensory deficit.     Motor: Motor function is intact.  Psychiatric:        Attention and Perception: Attention normal.        Speech: Speech normal.        Behavior: Behavior normal.     ED Results / Procedures / Treatments   Labs (all labs ordered are listed, but only abnormal results are displayed) Labs Reviewed - No data to display  EKG EKG Interpretation Date/Time:  Wednesday Jun 23 2023 17:29:01 EDT Ventricular Rate:  78 PR Interval:  162 QRS Duration:  82 QT Interval:  425 QTC Calculation: 485 R Axis:   9  Text Interpretation: Sinus rhythm Biatrial enlargement Left ventricular hypertrophy Nonspecific T abnormalities, diffuse leads Confirmed by Lind Repine ([id number]) on 06/23/2023 6:32:43 PM  Radiology No results found.  Procedures Procedures    Medications Ordered in ED Medications - No data to  display  ED Course/ Medical Decision Making/ A&P                                 Medical Decision Making Amount and/or Complexity of Data Reviewed Labs: ordered. ECG/medicine tests: ordered.  Risk Prescription drug management.  Patient's EKG shows normal sinus rhythm. Patient hypertensive here and received her normal daily medications as well as a dose of hydralazine .  Repeat blood pressure is much improved.  Her renal function is within normal limits.  Monitored for several hours and is doing well.  Will discharge home  CRITICAL CARE Performed by: Eden Goodpasture Total critical care time: 45 minutes Critical care time was exclusive of separately billable procedures and treating other patients. Critical care was necessary to treat or prevent imminent or life-threatening deterioration. Critical care was time spent personally by me on the following activities: development of treatment plan with patient and/or surrogate as well as nursing, discussions with consultants, evaluation of patient's response to treatment, examination of patient, obtaining history from patient or surrogate, ordering and performing treatments and interventions, ordering and review of laboratory studies, ordering and review of radiographic studies, pulse oximetry and re-evaluation of patient's condition.         Final Clinical Impression(s) / ED Diagnoses Final diagnoses:  None    Rx / DC Orders ED Discharge Orders     None         Lind Repine, MD 06/23/23 1951

## 2023-06-25 ENCOUNTER — Other Ambulatory Visit (HOSPITAL_COMMUNITY): Payer: Self-pay

## 2023-07-28 ENCOUNTER — Other Ambulatory Visit (HOSPITAL_COMMUNITY): Payer: Self-pay

## 2023-08-04 ENCOUNTER — Other Ambulatory Visit (HOSPITAL_COMMUNITY): Payer: Self-pay

## 2023-10-07 ENCOUNTER — Other Ambulatory Visit (HOSPITAL_COMMUNITY): Payer: Self-pay

## 2023-10-12 ENCOUNTER — Other Ambulatory Visit: Payer: Self-pay

## 2023-10-12 ENCOUNTER — Encounter (HOSPITAL_COMMUNITY): Payer: Self-pay

## 2023-10-12 ENCOUNTER — Emergency Department (HOSPITAL_COMMUNITY)
Admission: EM | Admit: 2023-10-12 | Discharge: 2023-10-13 | Disposition: A | Discharge status: Home / Self Care | Attending: Emergency Medicine | Admitting: Emergency Medicine

## 2023-10-12 DIAGNOSIS — R55 Syncope and collapse: Secondary | ICD-10-CM | POA: Insufficient documentation

## 2023-10-12 DIAGNOSIS — Z743 Need for continuous supervision: Secondary | ICD-10-CM | POA: Diagnosis not present

## 2023-10-12 DIAGNOSIS — R6889 Other general symptoms and signs: Secondary | ICD-10-CM | POA: Diagnosis not present

## 2023-10-12 DIAGNOSIS — I10 Essential (primary) hypertension: Secondary | ICD-10-CM | POA: Diagnosis not present

## 2023-10-12 DIAGNOSIS — N3000 Acute cystitis without hematuria: Secondary | ICD-10-CM | POA: Insufficient documentation

## 2023-10-12 DIAGNOSIS — Z79899 Other long term (current) drug therapy: Secondary | ICD-10-CM | POA: Insufficient documentation

## 2023-10-12 DIAGNOSIS — I499 Cardiac arrhythmia, unspecified: Secondary | ICD-10-CM | POA: Diagnosis not present

## 2023-10-12 LAB — URINALYSIS, ROUTINE W REFLEX MICROSCOPIC
Bilirubin Urine: NEGATIVE
Glucose, UA: NEGATIVE mg/dL
Hgb urine dipstick: NEGATIVE
Ketones, ur: NEGATIVE mg/dL
Nitrite: NEGATIVE
Protein, ur: 100 mg/dL — AB
Specific Gravity, Urine: 1.024 (ref 1.005–1.030)
pH: 5 (ref 5.0–8.0)

## 2023-10-12 LAB — CBC WITH DIFFERENTIAL/PLATELET
Abs Immature Granulocytes: 0.02 K/uL (ref 0.00–0.07)
Basophils Absolute: 0 K/uL (ref 0.0–0.1)
Basophils Relative: 0 %
Eosinophils Absolute: 0 K/uL (ref 0.0–0.5)
Eosinophils Relative: 0 %
HCT: 46.8 % — ABNORMAL HIGH (ref 36.0–46.0)
Hemoglobin: 14.8 g/dL (ref 12.0–15.0)
Immature Granulocytes: 0 %
Lymphocytes Relative: 42 %
Lymphs Abs: 2.2 K/uL (ref 0.7–4.0)
MCH: 29.1 pg (ref 26.0–34.0)
MCHC: 31.6 g/dL (ref 30.0–36.0)
MCV: 91.9 fL (ref 80.0–100.0)
Monocytes Absolute: 0.3 K/uL (ref 0.1–1.0)
Monocytes Relative: 6 %
Neutro Abs: 2.7 K/uL (ref 1.7–7.7)
Neutrophils Relative %: 52 %
Platelets: 285 K/uL (ref 150–400)
RBC: 5.09 MIL/uL (ref 3.87–5.11)
RDW: 13.6 % (ref 11.5–15.5)
WBC: 5.3 K/uL (ref 4.0–10.5)
nRBC: 0 % (ref 0.0–0.2)

## 2023-10-12 LAB — BASIC METABOLIC PANEL WITH GFR
Anion gap: 18 — ABNORMAL HIGH (ref 5–15)
BUN: 33 mg/dL — ABNORMAL HIGH (ref 8–23)
CO2: 21 mmol/L — ABNORMAL LOW (ref 22–32)
Calcium: 10.2 mg/dL (ref 8.9–10.3)
Chloride: 97 mmol/L — ABNORMAL LOW (ref 98–111)
Creatinine, Ser: 1.35 mg/dL — ABNORMAL HIGH (ref 0.44–1.00)
GFR, Estimated: 43 mL/min — ABNORMAL LOW (ref >60)
Glucose, Bld: 187 mg/dL — ABNORMAL HIGH (ref 70–99)
Potassium: 3.7 mmol/L (ref 3.5–5.1)
Sodium: 136 mmol/L (ref 135–145)

## 2023-10-12 LAB — CBG MONITORING, ED: Glucose-Capillary: 185 mg/dL — ABNORMAL HIGH (ref 70–99)

## 2023-10-12 MED ORDER — AMLODIPINE BESYLATE 5 MG PO TABS: 10.0000 mg | ORAL_TABLET | Freq: Every day | ORAL | Status: DC

## 2023-10-12 MED ORDER — AMLODIPINE BESYLATE 10 MG PO TABS
ORAL_TABLET | Freq: Every day | ORAL | 2 refills | Status: DC
  Filled 2023-10-12: qty 30, fill #0

## 2023-10-12 MED ORDER — CEPHALEXIN 500 MG PO CAPS
500.0000 mg | ORAL_CAPSULE | Freq: Three times a day (TID) | ORAL | 0 refills | Status: DC
  Filled 2023-10-12: qty 15, 5d supply, fill #0

## 2023-10-12 MED ORDER — LACTATED RINGERS IV BOLUS
1000.0000 mL | Freq: Once | INTRAVENOUS | Status: AC
  Administered 2023-10-12: 1000 mL via INTRAVENOUS

## 2023-10-12 MED ORDER — CARVEDILOL 12.5 MG PO TABS
25.0000 mg | ORAL_TABLET | Freq: Two times a day (BID) | ORAL | Status: DC
  Administered 2023-10-12: 25 mg via ORAL
  Filled 2023-10-12: qty 2

## 2023-10-12 MED ORDER — CARVEDILOL 25 MG PO TABS: ORAL_TABLET | Freq: Two times a day (BID) | ORAL | 2 refills | Status: DC

## 2023-10-12 MED ORDER — CEPHALEXIN 500 MG PO CAPS: 500.0000 mg | ORAL_CAPSULE | Freq: Three times a day (TID) | ORAL | 0 refills | Status: DC

## 2023-10-12 MED ORDER — AMLODIPINE BESYLATE 10 MG PO TABS: ORAL_TABLET | Freq: Every day | ORAL | 2 refills | Status: DC

## 2023-10-12 MED ORDER — SODIUM CHLORIDE 0.9 % IV SOLN
1.0000 g | Freq: Once | INTRAVENOUS | Status: AC
  Administered 2023-10-12: 1 g via INTRAVENOUS
  Filled 2023-10-12: qty 10

## 2023-10-12 MED ORDER — CARVEDILOL 25 MG PO TABS
ORAL_TABLET | Freq: Two times a day (BID) | ORAL | 2 refills | Status: DC
  Filled 2023-10-12: qty 180, fill #0

## 2023-10-12 NOTE — ED Provider Notes (Addendum)
 [place] EMERGENCY DEPARTMENT AT [facility] Provider Note   CSN: [id number] Arrival date & time: 10/12/23  1610     Patient presents with: Loss of Consciousness   Raven Buckley is a 68 y.o. female.    Loss of Consciousness    Pt felt very weak today.  She felt like she was going to pass out.  SHe felt hot and flushed.  SHe denies any urinary symptoms.  No chest pain or shortness of breath.   No trouble moving arms or legs.  Pt has had near syncopal symptoms before.  Patient states she feels fine now.  Prior to Admission medications   Medication Sig Start Date End Date Taking? Authorizing Provider  amLODipine  (NORVASC ) 10 MG tablet TAKE 1 TABLET (10 MG TOTAL) BY MOUTH DAILY. 10/12/23 10/11/24  Randol Simmonds, MD  atorvastatin  (LIPITOR) 20 MG tablet Take 1 tablet (20 mg total) by mouth daily. Patient not taking: Reported on 06/23/2023 06/01/23   Odell Celinda Balo, MD  buPROPion  (WELLBUTRIN  SR) 150 MG 12 hr tablet Take 1 tablet (150 mg total) by mouth 2 (two) times daily. For smoking cessation Patient not taking: Reported on 06/23/2023 06/01/23   Odell Celinda Balo, MD  carvedilol  (COREG ) 25 MG tablet TAKE 1 TABLET (25 MG TOTAL) BY MOUTH 2 (TWO) TIMES DAILY WITH A MEAL. 10/12/23 10/11/24  Randol Simmonds, MD  cephALEXin  (KEFLEX ) 500 MG capsule Take 1 capsule (500 mg total) by mouth 3 (three) times daily. 10/12/23   Randol Simmonds, MD  diclofenac  Sodium (VOLTAREN ) 1 % GEL Apply 4 g topically 4 (four) times daily. Patient not taking: Reported on 06/23/2023 10/29/22   Emil Share, DO    Allergies: Penicillins    Review of Systems  Cardiovascular:  Positive for syncope.    Updated Vital Signs BP (!) 153/72   Pulse 86   Temp 98.2 F (36.8 C) (Oral)   Resp 19   SpO2 100%   Physical Exam Vitals and nursing note reviewed.  Constitutional:      General: She is not in acute distress.    Appearance: She is well-developed.  HENT:     Head: Normocephalic and atraumatic.     Right Ear:  External ear normal.     Left Ear: External ear normal.  Eyes:     General: No visual field deficit or scleral icterus.       Right eye: No discharge.        Left eye: No discharge.     Conjunctiva/sclera: Conjunctivae normal.  Neck:     Trachea: No tracheal deviation.  Cardiovascular:     Rate and Rhythm: Normal rate and regular rhythm.  Pulmonary:     Effort: Pulmonary effort is normal. No respiratory distress.     Breath sounds: Normal breath sounds. No stridor. No wheezing or rales.  Abdominal:     General: Bowel sounds are normal. There is no distension.     Palpations: Abdomen is soft.     Tenderness: There is no abdominal tenderness. There is no guarding or rebound.  Musculoskeletal:        General: No tenderness.     Cervical back: Neck supple.  Skin:    General: Skin is warm and dry.     Findings: No rash.  Neurological:     Mental Status: She is alert and oriented to person, place, and time.     Cranial Nerves: No cranial nerve deficit, dysarthria or facial asymmetry.  Sensory: No sensory deficit.     Motor: No abnormal muscle tone, seizure activity or pronator drift.     Coordination: Coordination normal.     Comments:  able to hold both legs off bed for 5 seconds, sensation intact in all extremities,  no left or right sided neglect, normal finger-nose exam bilaterally, no nystagmus noted   Psychiatric:        Mood and Affect: Mood normal.     (all labs ordered are listed, but only abnormal results are displayed) Labs Reviewed  BASIC METABOLIC PANEL WITH GFR - Abnormal; Notable for the following components:      Result Value   Chloride 97 (*)    CO2 21 (*)    Glucose, Bld 187 (*)    BUN 33 (*)    Creatinine, Ser 1.35 (*)    GFR, Estimated 43 (*)    Anion gap 18 (*)    All other components within normal limits  CBC WITH DIFFERENTIAL/PLATELET - Abnormal; Notable for the following components:   HCT 46.8 (*)    All other components within normal limits   URINALYSIS, ROUTINE W REFLEX MICROSCOPIC - Abnormal; Notable for the following components:   Color, Urine AMBER (*)    APPearance CLOUDY (*)    Protein, ur 100 (*)    Leukocytes,Ua LARGE (*)    Bacteria, UA FEW (*)    All other components within normal limits  CBG MONITORING, ED - Abnormal; Notable for the following components:   Glucose-Capillary 185 (*)    All other components within normal limits  URINE CULTURE    EKG: EKG Interpretation Date/Time:  Tuesday October 12 2023 16:32:46 EDT Ventricular Rate:  82 PR Interval:  142 QRS Duration:  83 QT Interval:  418 QTC Calculation: 489 R Axis:   -10  Text Interpretation: Sinus rhythm Biatrial enlargement Abnormal R-wave progression, early transition Left ventricular hypertrophy Borderline prolonged QT interval No significant change since last tracing Confirmed by Randol Simmonds [phone]) on 10/12/2023 9:38:25 PM  Radiology: No results found.   Procedures   Medications Ordered in the ED  amLODipine  (NORVASC ) tablet 10 mg (has no administration in time range)  carvedilol  (COREG ) tablet 25 mg (25 mg Oral Given 10/12/23 2324)  cefTRIAXone  (ROCEPHIN ) 1 g in sodium chloride  0.9 % 100 mL IVPB (0 g Intravenous Stopped 10/12/23 2303)  lactated ringers  bolus 1,000 mL (0 mLs Intravenous Stopped 10/12/23 2329)                                    Medical Decision Making Differential diagnosis includes cardiac dysrhythmia, dehydration, anemia, dehydration, vasovagal syncope  Problems Addressed: Acute cystitis without hematuria: acute illness or injury Hypertension, unspecified type: chronic illness or injury Near syncope: acute illness or injury that poses a threat to life or bodily functions  Amount and/or Complexity of Data Reviewed Labs: ordered. Decision-making details documented in ED Course.  Risk Prescription drug management.   Patient presented with near syncopal event.  In the ED patient did not show any signs of cardiac  dysrhythmia cardiac monitor.  Patient did not show any signs of systemic infection.  No fever no leukocytosis.    Patient does not have any evidence of anemia.  Patient does appear to have a UTI.  She has been having some urinary discomfort.  She also appeared to be mildly dehydrated with increased urinary creatinine.  Patient was treated with  IV fluids she was also given a dose of her blood pressure medications which she had not taken today.  Patient is here with her family.  Appears appropriate for discharge and outpatient management.    Final diagnoses:  Acute cystitis without hematuria  Near syncope  Hypertension, unspecified type    ED Discharge Orders          Ordered    amLODipine  (NORVASC ) 10 MG tablet  Daily,   Status:  Discontinued        10/12/23 2323    carvedilol  (COREG ) 25 MG tablet  2 times daily with meals,   Status:  Discontinued        10/12/23 2323    cephALEXin  (KEFLEX ) 500 MG capsule  3 times daily,   Status:  Discontinued        10/12/23 2330    amLODipine  (NORVASC ) 10 MG tablet  Daily        10/12/23 2350    carvedilol  (COREG ) 25 MG tablet  2 times daily with meals        10/12/23 2350    cephALEXin  (KEFLEX ) 500 MG capsule  3 times daily        10/12/23 2350               Randol Simmonds, MD 10/12/23 2352    Randol Simmonds, MD 10/12/23 2352

## 2023-10-12 NOTE — Discharge Instructions (Signed)
 Take the antibiotics as prescribed for urinary tract infection.  Make sure to take your blood pressure medications regularly.  Follow-up with your doctor to recheck your blood pressure.  Return to the ED as needed for fevers chills or other concerning symptoms

## 2023-10-12 NOTE — ED Triage Notes (Addendum)
 Pt BIB EMS from [place] with reports of a syncopal episode. Pt was sitting in a booth. Pt is also having UTI symptoms. Pt lives at home alone, reports frequent and painful urination. Pt confused.

## 2023-10-12 NOTE — ED Notes (Signed)
 Called to add on urine culture

## 2023-10-13 ENCOUNTER — Other Ambulatory Visit: Payer: Self-pay

## 2023-10-14 LAB — URINE CULTURE

## 2023-10-19 ENCOUNTER — Telehealth: Payer: Self-pay

## 2023-10-19 NOTE — Telephone Encounter (Signed)
 Spoke with patient's bother [employer] . Patient is not with him at this time. [name] stated that he was aware of the call to have patient ear cleaned out. Advised that the patent has an appointment on 09/23//2023 an will advised at that time. Advised that if any thing changes with the patient ears or with her hearing to call us  back.

## 2023-10-26 ENCOUNTER — Ambulatory Visit (INDEPENDENT_AMBULATORY_CARE_PROVIDER_SITE_OTHER): Admitting: Family Medicine

## 2023-10-26 VITALS — BP 137/79 | HR 71 | Ht 59.0 in | Wt 113.0 lb

## 2023-10-26 DIAGNOSIS — R55 Syncope and collapse: Secondary | ICD-10-CM | POA: Diagnosis not present

## 2023-10-26 DIAGNOSIS — H6123 Impacted cerumen, bilateral: Secondary | ICD-10-CM | POA: Diagnosis not present

## 2023-10-26 NOTE — Progress Notes (Unsigned)
 Established Patient Office Visit  Subjective    Patient ID: Raven Buckley, female    DOB: 07-13-1955  Age: 68 y.o. MRN: [id number]  CC:  Chief Complaint  Patient presents with   Hospitalization Follow-up    pt has stopped up ears    HPI Raven Buckley presents with complaint of stopped up ears. She is having some difficulty hearing. She also had recent visit to ED for syncope but has a follow up appt scheduled with consultant for further evaluation. She has had no further episode since being seen. No etiology has been determined  Outpatient Encounter Medications as of 10/26/2023  Medication Sig   amLODipine  (NORVASC ) 10 MG tablet TAKE 1 TABLET (10 MG TOTAL) BY MOUTH DAILY.   atorvastatin  (LIPITOR) 20 MG tablet Take 1 tablet (20 mg total) by mouth daily.   carvedilol  (COREG ) 25 MG tablet TAKE 1 TABLET (25 MG TOTAL) BY MOUTH 2 (TWO) TIMES DAILY WITH A MEAL.   buPROPion  (WELLBUTRIN  SR) 150 MG 12 hr tablet Take 1 tablet (150 mg total) by mouth 2 (two) times daily. For smoking cessation (Patient not taking: Reported on 06/23/2023)   cephALEXin  (KEFLEX ) 500 MG capsule Take 1 capsule (500 mg total) by mouth 3 (three) times daily.   diclofenac  Sodium (VOLTAREN ) 1 % GEL Apply 4 g topically 4 (four) times daily. (Patient not taking: Reported on 06/23/2023)   No facility-administered encounter medications on file as of 10/26/2023.    Past Medical History:  Diagnosis Date   Hypertension     History reviewed. No pertinent surgical history.  History reviewed. No pertinent family history.  Social History   Socioeconomic History   Marital status: Single    Spouse name: Not on file   Number of children: Not on file   Years of education: Not on file   Highest education level: Not on file  Occupational History   Not on file  Tobacco Use   Smoking status: Every Day    Current packs/day: 0.50    Types: Cigarettes   Smokeless tobacco: Never  Substance and Sexual Activity   Alcohol use:  No   Drug use: Yes    Types: Marijuana   Sexual activity: Not on file  Other Topics Concern   Not on file  Social History Narrative   Not on file   Social Drivers of Health   Financial Resource Strain: Low Risk  (12/05/2021)   Overall Financial Resource Strain (CARDIA)    Difficulty of Paying Living Expenses: Not hard at all  Food Insecurity: No Food Insecurity (05/31/2023)   Hunger Vital Sign    Worried About Running Out of Food in the Last Year: Never true    Ran Out of Food in the Last Year: Never true  Transportation Needs: No Transportation Needs (05/31/2023)   PRAPARE - Administrator, Civil Service (Medical): No    Lack of Transportation (Non-Medical): No  Physical Activity: Insufficiently Active (12/05/2021)   Exercise Vital Sign    Days of Exercise per Week: 7 days    Minutes of Exercise per Session: 20 min  Stress: No Stress Concern Present (12/05/2021)   [employer] of Occupational Health - Occupational Stress Questionnaire    Feeling of Stress : Not at all  Social Connections: Moderately Isolated (05/31/2023)   Social Connection and Isolation Panel    Frequency of Communication with Friends and Family: Once a week    Frequency of Social Gatherings with Friends and Family:  Once a week    Attends Religious Services: More than 4 times per year    Active Member of Clubs or Organizations: Yes    Attends Banker Meetings: More than 4 times per year    Marital Status: Never married  Intimate Partner Violence: Not At Risk (05/31/2023)   Humiliation, Afraid, Rape, and Kick questionnaire    Fear of Current or Ex-Partner: No    Emotionally Abused: No    Physically Abused: No    Sexually Abused: No    Review of Systems  Neurological:  Positive for loss of consciousness.  All other systems reviewed and are negative.       Objective    BP 137/79   Pulse 71   Ht 4' 11 (1.499 m)   Wt 113 lb (51.3 kg)   SpO2 95%   BMI 22.82 kg/m    Physical Exam Vitals and nursing note reviewed.  Constitutional:      General: She is not in acute distress. HENT:     Right Ear: There is impacted cerumen.     Left Ear: There is impacted cerumen.  Cardiovascular:     Rate and Rhythm: Normal rate and regular rhythm.  Pulmonary:     Effort: Pulmonary effort is normal.     Breath sounds: Normal breath sounds.  Neurological:     General: No focal deficit present.     Mental Status: She is alert and oriented to person, place, and time.         Assessment & Plan:   1. Syncope, unspecified syncope type (Primary) Follow up with consultants for further eval/mgt  2. Bilateral hearing loss due to cerumen impaction Patient opted for OTC prep and will RTC if sx persist   Return if symptoms worsen or fail to improve.   Tanda Raguel SQUIBB, MD

## 2023-10-28 ENCOUNTER — Encounter: Payer: Self-pay | Admitting: Family Medicine

## 2023-11-10 DIAGNOSIS — H6123 Impacted cerumen, bilateral: Secondary | ICD-10-CM | POA: Diagnosis not present

## 2023-11-23 ENCOUNTER — Other Ambulatory Visit (HOSPITAL_COMMUNITY): Payer: Self-pay

## 2023-11-24 DIAGNOSIS — H6122 Impacted cerumen, left ear: Secondary | ICD-10-CM | POA: Diagnosis not present

## 2023-11-30 ENCOUNTER — Inpatient Hospital Stay (HOSPITAL_COMMUNITY)
Admission: EM | Admit: 2023-11-30 | Discharge: 2023-12-03 | DRG: 101 | Disposition: A | Discharge status: Home Health | Attending: Internal Medicine | Admitting: Internal Medicine

## 2023-11-30 ENCOUNTER — Emergency Department (HOSPITAL_COMMUNITY)

## 2023-11-30 ENCOUNTER — Other Ambulatory Visit: Payer: Self-pay

## 2023-11-30 ENCOUNTER — Encounter (HOSPITAL_COMMUNITY): Payer: Self-pay | Admitting: Family Medicine

## 2023-11-30 ENCOUNTER — Telehealth: Payer: Self-pay

## 2023-11-30 DIAGNOSIS — I10 Essential (primary) hypertension: Secondary | ICD-10-CM | POA: Diagnosis present

## 2023-11-30 DIAGNOSIS — R9089 Other abnormal findings on diagnostic imaging of central nervous system: Secondary | ICD-10-CM | POA: Diagnosis not present

## 2023-11-30 DIAGNOSIS — Z79899 Other long term (current) drug therapy: Secondary | ICD-10-CM | POA: Diagnosis not present

## 2023-11-30 DIAGNOSIS — F1210 Cannabis abuse, uncomplicated: Secondary | ICD-10-CM | POA: Diagnosis present

## 2023-11-30 DIAGNOSIS — R569 Unspecified convulsions: Principal | ICD-10-CM

## 2023-11-30 DIAGNOSIS — R531 Weakness: Secondary | ICD-10-CM | POA: Diagnosis not present

## 2023-11-30 DIAGNOSIS — G9340 Encephalopathy, unspecified: Secondary | ICD-10-CM

## 2023-11-30 DIAGNOSIS — E785 Hyperlipidemia, unspecified: Secondary | ICD-10-CM | POA: Diagnosis present

## 2023-11-30 DIAGNOSIS — Z880 Allergy status to penicillin: Secondary | ICD-10-CM | POA: Diagnosis not present

## 2023-11-30 DIAGNOSIS — I6522 Occlusion and stenosis of left carotid artery: Secondary | ICD-10-CM | POA: Diagnosis not present

## 2023-11-30 DIAGNOSIS — R4182 Altered mental status, unspecified: Secondary | ICD-10-CM | POA: Diagnosis not present

## 2023-11-30 DIAGNOSIS — Z8673 Personal history of transient ischemic attack (TIA), and cerebral infarction without residual deficits: Secondary | ICD-10-CM | POA: Diagnosis not present

## 2023-11-30 DIAGNOSIS — R0989 Other specified symptoms and signs involving the circulatory and respiratory systems: Secondary | ICD-10-CM | POA: Diagnosis not present

## 2023-11-30 DIAGNOSIS — F17210 Nicotine dependence, cigarettes, uncomplicated: Secondary | ICD-10-CM | POA: Diagnosis present

## 2023-11-30 DIAGNOSIS — G40909 Epilepsy, unspecified, not intractable, without status epilepticus: Principal | ICD-10-CM | POA: Diagnosis present

## 2023-11-30 DIAGNOSIS — R930 Abnormal findings on diagnostic imaging of skull and head, not elsewhere classified: Secondary | ICD-10-CM | POA: Diagnosis not present

## 2023-11-30 DIAGNOSIS — I672 Cerebral atherosclerosis: Secondary | ICD-10-CM | POA: Diagnosis not present

## 2023-11-30 DIAGNOSIS — I6782 Cerebral ischemia: Secondary | ICD-10-CM | POA: Diagnosis not present

## 2023-11-30 DIAGNOSIS — Z743 Need for continuous supervision: Secondary | ICD-10-CM | POA: Diagnosis not present

## 2023-11-30 LAB — URINALYSIS, W/ REFLEX TO CULTURE (INFECTION SUSPECTED)
Bilirubin Urine: NEGATIVE
Glucose, UA: 50 mg/dL — AB
Ketones, ur: NEGATIVE mg/dL
Leukocytes,Ua: NEGATIVE
Nitrite: NEGATIVE
Protein, ur: 30 mg/dL — AB
Specific Gravity, Urine: 1.012 (ref 1.005–1.030)
pH: 7 (ref 5.0–8.0)

## 2023-11-30 LAB — COMPREHENSIVE METABOLIC PANEL WITH GFR
ALT: 17 U/L (ref 0–44)
AST: 21 U/L (ref 15–41)
Albumin: 3.8 g/dL (ref 3.5–5.0)
Alkaline Phosphatase: 88 U/L (ref 38–126)
Anion gap: 10 (ref 5–15)
BUN: 10 mg/dL (ref 8–23)
CO2: 24 mmol/L (ref 22–32)
Calcium: 8.9 mg/dL (ref 8.9–10.3)
Chloride: 103 mmol/L (ref 98–111)
Creatinine, Ser: 0.69 mg/dL (ref 0.44–1.00)
GFR, Estimated: >60 mL/min (ref >60)
Glucose, Bld: 143 mg/dL — ABNORMAL HIGH (ref 70–99)
Potassium: 3.8 mmol/L (ref 3.5–5.1)
Sodium: 137 mmol/L (ref 135–145)
Total Bilirubin: 0.7 mg/dL (ref 0.0–1.2)
Total Protein: 7.2 g/dL (ref 6.5–8.1)

## 2023-11-30 LAB — I-STAT CHEM 8, ED
BUN: 11 mg/dL (ref 8–23)
Calcium, Ion: 1.17 mmol/L (ref 1.15–1.40)
Chloride: 103 mmol/L (ref 98–111)
Creatinine, Ser: 0.7 mg/dL (ref 0.44–1.00)
Glucose, Bld: 135 mg/dL — ABNORMAL HIGH (ref 70–99)
HCT: 40 % (ref 36.0–46.0)
Hemoglobin: 13.6 g/dL (ref 12.0–15.0)
Potassium: 3.9 mmol/L (ref 3.5–5.1)
Sodium: 140 mmol/L (ref 135–145)
TCO2: 25 mmol/L (ref 22–32)

## 2023-11-30 LAB — TROPONIN I (HIGH SENSITIVITY)
Troponin I (High Sensitivity): 13 ng/L (ref <18)
Troponin I (High Sensitivity): 19 ng/L — ABNORMAL HIGH (ref <18)

## 2023-11-30 LAB — CBC WITH DIFFERENTIAL/PLATELET
Abs Immature Granulocytes: 0.01 K/uL (ref 0.00–0.07)
Basophils Absolute: 0 K/uL (ref 0.0–0.1)
Basophils Relative: 0 %
Eosinophils Absolute: 0 K/uL (ref 0.0–0.5)
Eosinophils Relative: 1 %
HCT: 38.2 % (ref 36.0–46.0)
Hemoglobin: 12.4 g/dL (ref 12.0–15.0)
Immature Granulocytes: 0 %
Lymphocytes Relative: 18 %
Lymphs Abs: 1.2 K/uL (ref 0.7–4.0)
MCH: 29.5 pg (ref 26.0–34.0)
MCHC: 32.5 g/dL (ref 30.0–36.0)
MCV: 90.7 fL (ref 80.0–100.0)
Monocytes Absolute: 0.3 K/uL (ref 0.1–1.0)
Monocytes Relative: 4 %
Neutro Abs: 5.1 K/uL (ref 1.7–7.7)
Neutrophils Relative %: 77 %
Platelets: 327 K/uL (ref 150–400)
RBC: 4.21 MIL/uL (ref 3.87–5.11)
RDW: 13.8 % (ref 11.5–15.5)
WBC: 6.6 K/uL (ref 4.0–10.5)
nRBC: 0 % (ref 0.0–0.2)

## 2023-11-30 LAB — I-STAT CG4 LACTIC ACID, ED
Lactic Acid, Venous: 2.1 mmol/L (ref 0.5–1.9)
Lactic Acid, Venous: 1 mmol/L (ref 0.5–1.9)

## 2023-11-30 LAB — RAPID URINE DRUG SCREEN, HOSP PERFORMED
Amphetamines: NOT DETECTED
Barbiturates: NOT DETECTED
Benzodiazepines: NOT DETECTED
Cocaine: NOT DETECTED
Opiates: NOT DETECTED
Tetrahydrocannabinol: POSITIVE — AB

## 2023-11-30 LAB — ETHANOL: Alcohol, Ethyl (B): <15 mg/dL (ref <15)

## 2023-11-30 LAB — TSH: TSH: 0.612 u[IU]/mL (ref 0.350–4.500)

## 2023-11-30 LAB — CK: Total CK: 113 U/L (ref 38–234)

## 2023-11-30 LAB — CBG MONITORING, ED: Glucose-Capillary: 153 mg/dL — ABNORMAL HIGH (ref 70–99)

## 2023-11-30 LAB — AMMONIA: Ammonia: 32 umol/L (ref 9–35)

## 2023-11-30 MED ORDER — LORAZEPAM 2 MG/ML IJ SOLN
INTRAMUSCULAR | Status: DC
Start: 2023-11-30 — End: 2023-11-30
  Filled 2023-11-30: qty 1

## 2023-11-30 MED ORDER — ACETAMINOPHEN 325 MG PO TABS: 650.0000 mg | ORAL_TABLET | Freq: Four times a day (QID) | ORAL | Status: DC | PRN

## 2023-11-30 MED ORDER — LORAZEPAM 2 MG/ML IJ SOLN
0.5000 mg | Freq: Once | INTRAMUSCULAR | Status: AC
  Administered 2023-11-30: 0.5 mg via INTRAVENOUS
  Filled 2023-11-30: qty 1

## 2023-11-30 MED ORDER — SODIUM CHLORIDE 0.9 % IV BOLUS
500.0000 mL | Freq: Once | INTRAVENOUS | Status: AC
  Administered 2023-11-30: 500 mL via INTRAVENOUS

## 2023-11-30 MED ORDER — LEVETIRACETAM (KEPPRA) 500 MG/5 ML ADULT IV PUSH
1000.0000 mg | Freq: Once | INTRAVENOUS | Status: AC
  Administered 2023-11-30: 1000 mg via INTRAVENOUS
  Filled 2023-11-30: qty 10

## 2023-11-30 MED ORDER — LEVETIRACETAM (KEPPRA) 500 MG/5 ML ADULT IV PUSH
500.0000 mg | Freq: Two times a day (BID) | INTRAVENOUS | Status: DC
Start: 2023-11-30 — End: 2023-12-02
  Administered 2023-11-30 – 2023-12-01 (×3): 500 mg via INTRAVENOUS
  Filled 2023-11-30 (×3): qty 5

## 2023-11-30 MED ORDER — ENOXAPARIN SODIUM 40 MG/0.4ML IJ SOSY
40.0000 mg | PREFILLED_SYRINGE | INTRAMUSCULAR | Status: DC
  Administered 2023-12-01: 40 mg via SUBCUTANEOUS
  Filled 2023-11-30 (×3): qty 0.4

## 2023-11-30 MED ORDER — LORAZEPAM 2 MG/ML IJ SOLN: 0.5000 mg | INTRAMUSCULAR | Status: DC | PRN

## 2023-11-30 MED ORDER — IOHEXOL 350 MG/ML SOLN
75.0000 mL | Freq: Once | INTRAVENOUS | Status: AC | PRN
  Administered 2023-11-30: 75 mL via INTRAVENOUS

## 2023-11-30 MED ORDER — LEVETIRACETAM (KEPPRA) 500 MG/5 ML ADULT IV PUSH
500.0000 mg | Freq: Once | INTRAVENOUS | Status: AC
  Administered 2023-11-30: 500 mg via INTRAVENOUS
  Filled 2023-11-30: qty 5

## 2023-11-30 NOTE — H&P (Addendum)
 History and Physical    Patient: Raven Buckley FMW:[id number] DOB: 1955-08-27 DOA: 11/30/2023 DOS: the patient was seen and examined on 11/30/2023 PCP: Delbert Clam, MD  Patient coming from: Local bus stop   Chief Complaint:  Chief Complaint  Patient presents with   Altered Mental Status   HPI: Raven Buckley is a 68 y.o. female with medical history significant of hypertension hyperlipidemia presenting with encephalopathy, witnessed seizure.  Limited history in the setting of encephalopathy.  History from nursing as well as brother at the bedside.  Per report, patient was at the bus stop and noted to be unresponsive and drooling.  EMS subsequently called.  Brother denies any prior episodes like this in the past though patient is noted to have been recently admitted for similar issues including encephalopathy.  Brother denies any known history of seizure disorder, polysubstance abuse, alcohol abuse in the past.  Patient lives at home alone.  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  At present, patient denies any chest pain, shortness of breath, nausea or vomiting. Presented to the ER afebrile, heart rate 100s, BP stable.  Satting well on room air.  Labs including CBC, CMP as well as alcohol level within normal limits.  UDS positive for marijuana.  In discussion with nurse, there was a witnessed 45 seconds to 60 seconds generalized tonic-clonic seizure which resolved after Ativan  as well as IV Keppra load.  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  Neurology consulted for further evaluation.  EEG done at bedside. Review of Systems: unable to review all systems due to the inability of the patient to answer questions. Past Medical History:  Diagnosis Date   Hypertension    No past surgical history on file. Social History:  reports that she has been smoking cigarettes. She has never used smokeless tobacco. She reports current drug use. Drug: Marijuana. She  reports that she does not drink alcohol.  Allergies  Allergen Reactions   Penicillins Other (See Comments)    Pt stated: I don't know- it just bothers me, but I did not have a rash or swelling.      No family history on file.  Prior to Admission medications   Medication Sig Start Date End Date Taking? Authorizing Provider  amLODipine  (NORVASC ) 10 MG tablet TAKE 1 TABLET (10 MG TOTAL) BY MOUTH DAILY. 10/12/23 10/11/24  Randol Simmonds, MD  atorvastatin  (LIPITOR) 20 MG tablet Take 1 tablet (20 mg total) by mouth daily. 06/01/23   Odell Celinda Balo, MD  buPROPion  (WELLBUTRIN  SR) 150 MG 12 hr tablet Take 1 tablet (150 mg total) by mouth 2 (two) times daily. For smoking cessation Patient not taking: Reported on 06/23/2023 06/01/23   Odell Celinda Balo, MD  carvedilol  (COREG ) 25 MG tablet TAKE 1 TABLET (25 MG TOTAL) BY MOUTH 2 (TWO) TIMES DAILY WITH A MEAL. 10/12/23 10/11/24  Randol Simmonds, MD  cephALEXin  (KEFLEX ) 500 MG capsule Take 1 capsule (500 mg total) by mouth 3 (three) times daily. 10/12/23   Randol Simmonds, MD  diclofenac  Sodium (VOLTAREN ) 1 % GEL Apply 4 g topically 4 (four) times daily. Patient not taking: Reported on 06/23/2023 10/29/22   Emil Share, DO    Physical Exam: Vitals:   11/30/23 1132 11/30/23 1143 11/30/23 1214 11/30/23 1218  BP: (!) 148/56   129/81  Resp:  15    Temp:  97.7 F (36.5 C) 97.7 F (36.5 C)   TempSrc:  Axillary  SpO2:   100%    Physical Exam Constitutional:      Appearance: She is normal weight.  HENT:     Head: Normocephalic and atraumatic.     Nose: Nose normal.     Mouth/Throat:     Mouth: Mucous membranes are moist.  Eyes:     Pupils: Pupils are equal, round, and reactive to light.  Cardiovascular:     Rate and Rhythm: Normal rate and regular rhythm.  Pulmonary:     Effort: Pulmonary effort is normal.  Abdominal:     General: Bowel sounds are normal.  Musculoskeletal:     Comments: + generalized weakness    Skin:    General: Skin is warm.   Neurological:     Comments: + generalized lethargy       Data Reviewed:  There are no new results to review at this time.  EEG adult Shelton Arlin KIDD, MD     11/30/2023  3:11 PM Patient Name: Raven Buckley  MRN: [id number]  Epilepsy Attending: Arlin KIDD Shelton  Referring Physician/Provider: Remi Pippin, NP  Date: 11/30/2023 Duration: 27.45 mins  Patient history: a 68 y.o. female with a history of hypertension  who was found unresponsive at [place] bus stop by bystanders. EEG to  evaluate for seizure  Level of alertness: Awake  AEDs during EEG study: LEV  Technical aspects: This EEG study was done with scalp electrodes  positioned according to the 10-20 International system of  electrode placement. Electrical activity was reviewed with band  pass filter of 1-70Hz , sensitivity of 7 uV/mm, display speed of  44mm/sec with a 60Hz  notched filter applied as appropriate. EEG  data were recorded continuously and digitally stored.  Video  monitoring was available and reviewed as appropriate.  Description: EEG showed continuous generalized 3 to 6 Hz  theta-delta slowing admixed with 12-15hz  beta activity. Hyperventilation and photic stimulation were not performed.     ABNORMALITY - Continuous slow, generalized  IMPRESSION: This study is suggestive of generalized cerebral dysfunction  (encephalopathy). No seizures or epileptiform discharges were  seen throughout the recording.  Priyanka O Yadav  CT ANGIO HEAD NECK W WO CM EXAM: CTA HEAD AND NECK WITH AND WITHOUT 11/30/2023 01:18:09 PM  TECHNIQUE: CTA of the head and neck was performed with and without the administration of 75 mL of intravenous iohexol (OMNIPAQUE) 350 MG/ML injection. Multiplanar 2D and/or 3D reformatted images are provided for review. Automated exposure control, iterative reconstruction, and/or weight based adjustment of the mA/kV was utilized to reduce the radiation dose to as low as reasonably  achievable. Stenosis of the internal carotid arteries measured using NASCET criteria.  COMPARISON: CT head 05/30/2023  CLINICAL HISTORY:  FINDINGS:  CTA NECK: AORTIC ARCH AND ARCH VESSELS: Common origin of the brachiocephalic and left common carotid arteries. Mild atherosclerosis along the distal aspect of the aortic arch. No dissection or arterial injury. No significant stenosis of the brachiocephalic or subclavian arteries.  CERVICAL CAROTID ARTERIES: No dissection or arterial injury. Mild atherosclerosis at the left carotid bifurcation without hemodynamically significant stenosis. The right common and internal carotid arteries are patent without hemodynamically significant stenosis by NASCET criteria.  CERVICAL VERTEBRAL ARTERIES: No dissection or arterial injury. Mild atherosclerosis of the proximal V4 segment of the right vertebral artery without significant stenosis. The left vertebral artery is patent without significant stenosis.  LUNGS AND MEDIASTINUM: Unremarkable.  SOFT TISSUES: Raph edentulous maxilla and mandible. No acute abnormality.  BONES: No acute abnormality.  CTA HEAD:  ANTERIOR CIRCULATION: Mild atherosclerosis of the left carotid siphon without significant stenosis. The right internal carotid artery is patent without significant stenosis. No significant stenosis of the anterior cerebral arteries. No significant stenosis of the middle cerebral arteries. No aneurysm.  POSTERIOR CIRCULATION: Fetal origin of the left PCA. No significant stenosis of the right posterior cerebral artery. No significant stenosis of the basilar artery. No significant stenosis of the vertebral arteries. No aneurysm.  OTHER: There is a remote infarct in the right dorsal aspect of the pons which is new since the prior study. Mild chronic microvascular ischemic changes. No dural venous sinus thrombosis on this non-dedicated study.  IMPRESSION: 1. 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 2. No large vessel occlusion. 3. Mild chronic microvascular ischemic changes. 4. Mild atherosclerosis at the left carotid bifurcation, proximal V4 segment of the right vertebral artery, and left carotid siphon without hemodynamically significant stenosis.  Electronically signed by: Donnice Mania MD 11/30/2023 01:59 PM EDT RP Workstation: [id number] DG Chest Port 1 View CLINICAL DATA:  Weakness  EXAM: PORTABLE CHEST 1 VIEW  COMPARISON:  Chest x-ray performed Jun 29, 2023  FINDINGS: Patient is rotated. Heart mediastinum are not significantly changed. Low lung volumes. No pleural effusion or pneumothorax.  IMPRESSION: 1. Low lung volumes with bibasilar hypoventilatory changes.  Electronically Signed   By: Maude Naegeli M.D.   On: 11/30/2023 13:52  Lab Results  Component Value Date   WBC 6.6 11/30/2023   HGB 13.6 11/30/2023   HCT 40.0 11/30/2023   MCV 90.7 11/30/2023   PLT 327 11/30/2023   Last metabolic panel Lab Results  Component Value Date   GLUCOSE 135 (H) 11/30/2023   NA 140 11/30/2023   K 3.9 11/30/2023   CL 103 11/30/2023   CO2 24 11/30/2023   BUN 11 11/30/2023   CREATININE 0.70 11/30/2023   GFRNONAA >60 11/30/2023   CALCIUM  8.9 11/30/2023   PHOS 3.0 08/29/2011   PROT 7.2 11/30/2023   ALBUMIN 3.8 11/30/2023   LABGLOB 2.5 05/06/2021   AGRATIO 1.7 05/06/2021   BILITOT 0.7 11/30/2023   ALKPHOS 88 11/30/2023   AST 21 11/30/2023   ALT 17 11/30/2023   ANIONGAP 10 11/30/2023    Assessment and Plan: Acute encephalopathy Seizure  Pt noted to have been [place] local bus stop lethargic and drooling with symptomatic improvement after IVF  + witnessed GTC seizure in the ER  S/p IV keppra load  CTA head and neck with 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 Observation status is judged to be reasonable and necessary in order to provide the required intensity of service to ensure the patient's safety. The patient's presenting symptoms, physical exam findings, and initial radiographic and laboratory data in the context of their medical condition is felt to place them at decreased risk for further clinical deterioration. Furthermore, it is anticipated that the patient will be medically stable for discharge from the hospital within 2 midnights of  admission.   Author: Elspeth JINNY Masters, MD 11/30/2023 3:17 PM  For on call review [email or web address].

## 2023-11-30 NOTE — ED Notes (Signed)
 Pt placed in seizure precautions

## 2023-11-30 NOTE — Procedures (Signed)
 Patient Name: Raven Buckley  MRN: [id number]  Epilepsy Attending: Arlin MALVA Krebs  Referring Physician/Provider: Remi Pippin, NP  Date: 11/30/2023 Duration: 27.45 mins  Patient history: a 68 y.o. female with a history of hypertension who was found unresponsive at a bus stop by bystanders. EEG to evaluate for seizure  Level of alertness: Awake  AEDs during EEG study: LEV  Technical aspects: This EEG study was done with scalp electrodes positioned according to the 10-20 International system of electrode placement. Electrical activity was reviewed with band pass filter of 1-70Hz , sensitivity of 7 uV/mm, display speed of 54mm/sec with a 60Hz  notched filter applied as appropriate. EEG data were recorded continuously and digitally stored.  Video monitoring was available and reviewed as appropriate.  Description: EEG showed continuous generalized 3 to 6 Hz theta-delta slowing admixed with 12-15hz  beta activity. Hyperventilation and photic stimulation were not performed.     ABNORMALITY - Continuous slow, generalized  IMPRESSION: This study is suggestive of generalized cerebral dysfunction (encephalopathy). No seizures or epileptiform discharges were seen throughout the recording.  Raven Buckley

## 2023-11-30 NOTE — Assessment & Plan Note (Signed)
 Resume statin pending CK

## 2023-11-30 NOTE — Consult Note (Addendum)
 NEUROLOGY CONSULT NOTE   Date of service: November 30, 2023 Patient Name: Raven Buckley MRN:  [id number] DOB:  02/28/1955 Chief Complaint: Seizure Requesting Provider: Laurice Maude BROCKS, MD  History of Present Illness  Raven Buckley is a 68 y.o. female with hx of hypertension, brought in by EMS when she was found unresponsive at a local bus stop.  Some bystanders noticed her to be unresponsive and called EMS.  She was only responsive to painful stimuli.  She was drooling from her mouth.  Her CBG was in normal limits.  During transport, EMS started IV access and patient's mentation improved.  She seemed to be doing better and was conversant upon arrival to the ED but still seemed confused.  She had a seizure in the ED-that lasted less than a minute-generalized tonic-clonic.  After that she was very obtunded. Neurological consultation was obtained due to possible multiple seizures today.    ROS   Unable to ascertain due to altered mental status  Past History   Past Medical History:  Diagnosis Date   Hypertension     No past surgical history on file.  Family History: No family history on file.  Social History  reports that she has been smoking cigarettes. She has never used smokeless tobacco. She reports current drug use. Drug: Marijuana. She reports that she does not drink alcohol.  Allergies  Allergen Reactions   Penicillins Other (See Comments)    Pt stated: I don't know- it just bothers me, but I did not have a rash or swelling.      Medications   Current Facility-Administered Medications:    levETIRAcetam (KEPPRA) undiluted injection 500 mg, 500 mg, Intravenous, Once, Messick, Peter C, MD   LORazepam  (ATIVAN ) 2 MG/ML injection, , , ,   Current Outpatient Medications:    amLODipine  (NORVASC ) 10 MG tablet, TAKE 1 TABLET (10 MG TOTAL) BY MOUTH DAILY., Disp: 30 tablet, Rfl: 2   atorvastatin  (LIPITOR) 20 MG tablet, Take 1 tablet (20 mg total) by mouth daily., Disp: 30  tablet, Rfl: 3   buPROPion  (WELLBUTRIN  SR) 150 MG 12 hr tablet, Take 1 tablet (150 mg total) by mouth 2 (two) times daily. For smoking cessation (Patient not taking: Reported on 06/23/2023), Disp: 60 tablet, Rfl: 3   carvedilol  (COREG ) 25 MG tablet, TAKE 1 TABLET (25 MG TOTAL) BY MOUTH 2 (TWO) TIMES DAILY WITH A MEAL., Disp: 180 tablet, Rfl: 2   cephALEXin  (KEFLEX ) 500 MG capsule, Take 1 capsule (500 mg total) by mouth 3 (three) times daily., Disp: 15 capsule, Rfl: 0   diclofenac  Sodium (VOLTAREN ) 1 % GEL, Apply 4 g topically 4 (four) times daily. (Patient not taking: Reported on 06/23/2023), Disp: 100 g, Rfl: 0  Vitals   Vitals:   11/30/23 1132 11/30/23 1143 11/30/23 1214 11/30/23 1218  BP: (!) 148/56   129/81  Resp:  15    Temp:  97.7 F (36.5 C) 97.7 F (36.5 C)   TempSrc:  Axillary    SpO2:   100%     There is no height or weight on file to calculate BMI.   Physical Exam   General: Obtunded HEENT: Normocephalic atraumatic Lungs: Clear Cardiovascular: Regular rate rhythm Neurological exam Obtunded No response to voice Does not follow commands Nonverbal Cranial nerves: Pupils are equal round reactive light, gaze is roving, face appears symmetric. Motor examination reveals spontaneous movements of all fours and strong withdrawal to noxious stimulation Sensory exam as above   Labs/Imaging/Neurodiagnostic studies  CBC:  Recent Labs  Lab 11/30/23 1217 11/30/23 1226  WBC 6.6  --   NEUTROABS 5.1  --   HGB 12.4 13.6  HCT 38.2 40.0  MCV 90.7  --   PLT 327  --    Basic Metabolic Panel:  Lab Results  Component Value Date   NA 140 11/30/2023   K 3.9 11/30/2023   CO2 21 (L) 10/12/2023   GLUCOSE 135 (H) 11/30/2023   BUN 11 11/30/2023   CREATININE 0.70 11/30/2023   CALCIUM  10.2 10/12/2023   GFRNONAA 43 (L) 10/12/2023   GFRAA 110 03/11/2020   Lipid Panel:  Lab Results  Component Value Date   LDLCALC 110 (H) 05/06/2021   HgbA1c:  Lab Results  Component Value  Date   HGBA1C 6.0 (H) 05/06/2021   Urine Drug Screen:     Component Value Date/Time   LABOPIA NONE DETECTED 05/30/2023 1951   COCAINSCRNUR NONE DETECTED 05/30/2023 1951   LABBENZ NONE DETECTED 05/30/2023 1951   AMPHETMU NONE DETECTED 05/30/2023 1951   THCU POSITIVE (A) 05/30/2023 1951   LABBARB NONE DETECTED 05/30/2023 1951    Alcohol Level     Component Value Date/Time   ETH <15 05/30/2023 1957    CT Head without contrast(Personally reviewed): No acute changes.  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  Mild chronic microvascular ischemic changes.   CT angio Head and Neck with contrast(Personally reviewed): Mild atherosclerosis of the left carotid bifurcation, proximal V4 segment of the right vertebral artery and left carotid siphon without hemodynamically significant stenosis  ASSESSMENT   Raven Buckley is a 68 y.o. female with a history of hypertension who was found unresponsive at a bus stop by bystanders.  She started to come around on the way to the hospital but then had a seizure in the hospital.  Suspect initial presentation also from the seizure. At the time of my examination she had just been given Ativan  after the second seizure and was very obtunded. She was withdrawing to noxious stimulation in all fours pretty strongly. Severely encephalopathic on examination.  Impression: Evaluate for seizure  RECOMMENDATIONS  Load with Keppra-1500 mg x 1 Start Keppra 500 twice daily Stat EEG ordered.  Preliminary review of the EEG with generalized slowing. Maintain seizure precautions No evidence of fever, neck stiffness or leukocytosis-low suspicion for CNS infection. Obtain urinary toxicology screen Chest x-ray Neurology will follow Plan discussed with Dr. Laurice ______________________________________________________________________  Signed, Eligio Lav, MD [facility]

## 2023-11-30 NOTE — ED Notes (Signed)
MD notified of lactic level  

## 2023-11-30 NOTE — ED Notes (Signed)
 Pyxis override for Ativan  due to emergent seizure. Seizure ended prior to arrival to room for admin. Per Laurice, MD med can be kept bedside for future use. Unused, unopened vial was given to Talbert Gist RN as care team lead.

## 2023-11-30 NOTE — ED Provider Notes (Signed)
 [place] EMERGENCY DEPARTMENT AT [facility] Provider Note   CSN: [id number] Arrival date & time: 11/30/23  1126     Patient presents with: Altered Mental Status   Raven Buckley is a 68 y.o. female.   68 year old female with prior medical history as detailed below presents for evaluation.  Patient was found unresponsive by bystanders at local bus stop.  EMS was called.  On EMS arrival patient was responsive only to painful stimuli.  Patient was drooling.  Glucose was within normal limits.  During transport, EMS started an IV and the patient's mentation improved significantly.  Patient was not noted to be hypoxic or with respiratory distress.  On arrival to the ED the patient is conversant and answers questions appropriately.  She is very confused as to where she is and how she got there.  She cannot recall the details of the events leading to her being unconscious and unresponsive at the bus stop.  She denies alcohol use.  She denies drug use.  She denies current pain or difficulty breathing.  The history is provided by the patient and medical records.       Prior to Admission medications   Medication Sig Start Date End Date Taking? Authorizing Provider  amLODipine  (NORVASC ) 10 MG tablet TAKE 1 TABLET (10 MG TOTAL) BY MOUTH DAILY. 10/12/23 10/11/24  Randol Simmonds, MD  atorvastatin  (LIPITOR) 20 MG tablet Take 1 tablet (20 mg total) by mouth daily. 06/01/23   Odell Celinda Balo, MD  buPROPion  (WELLBUTRIN  SR) 150 MG 12 hr tablet Take 1 tablet (150 mg total) by mouth 2 (two) times daily. For smoking cessation Patient not taking: Reported on 06/23/2023 06/01/23   Odell Celinda Balo, MD  carvedilol  (COREG ) 25 MG tablet TAKE 1 TABLET (25 MG TOTAL) BY MOUTH 2 (TWO) TIMES DAILY WITH A MEAL. 10/12/23 10/11/24  Randol Simmonds, MD  cephALEXin  (KEFLEX ) 500 MG capsule Take 1 capsule (500 mg total) by mouth 3 (three) times daily. 10/12/23   Randol Simmonds, MD  diclofenac  Sodium (VOLTAREN ) 1 % GEL Apply 4 g  topically 4 (four) times daily. Patient not taking: Reported on 06/23/2023 10/29/22   Emil Share, DO    Allergies: Penicillins    Review of Systems  All other systems reviewed and are negative.   Updated Vital Signs BP (!) 148/56   Temp 97.7 F (36.5 C) (Axillary)   Resp 15   SpO2 100%   Physical Exam Vitals and nursing note reviewed.  Constitutional:      General: She is not in acute distress.    Appearance: She is well-developed.  HENT:     Head: Normocephalic and atraumatic.  Eyes:     Conjunctiva/sclera: Conjunctivae normal.  Cardiovascular:     Rate and Rhythm: Normal rate and regular rhythm.     Heart sounds: No murmur heard. Pulmonary:     Effort: Pulmonary effort is normal. No respiratory distress.     Breath sounds: Normal breath sounds.  Abdominal:     Palpations: Abdomen is soft.     Tenderness: There is no abdominal tenderness.  Musculoskeletal:        General: No swelling.     Cervical back: Neck supple.  Skin:    General: Skin is warm and dry.     Capillary Refill: Capillary refill takes less than 2 seconds.  Neurological:     General: No focal deficit present.     Mental Status: She is alert and oriented to person, place,  and time. Mental status is at baseline.     Cranial Nerves: No cranial nerve deficit.     Motor: No weakness.  Psychiatric:        Mood and Affect: Mood normal.     (all labs ordered are listed, but only abnormal results are displayed) Labs Reviewed  ETHANOL  COMPREHENSIVE METABOLIC PANEL WITH GFR  CBC WITH DIFFERENTIAL/PLATELET  RAPID URINE DRUG SCREEN, HOSP PERFORMED  URINALYSIS, W/ REFLEX TO CULTURE (INFECTION SUSPECTED)  AMMONIA  TSH  I-STAT CHEM 8, ED  I-STAT CG4 LACTIC ACID, ED  CBG MONITORING, ED  TROPONIN I (HIGH SENSITIVITY)    EKG: EKG Interpretation Date/Time:  Tuesday November 30 2023 11:45:08 EDT Ventricular Rate:  74 PR Interval:  156 QRS Duration:  88 QT Interval:  389 QTC Calculation: 432 R  Axis:   60  Text Interpretation: Sinus rhythm Biatrial enlargement Left ventricular hypertrophy Borderline T abnormalities, inferior leads Confirmed by Laurice Coy [phone]) on 11/30/2023 11:48:07 AM  Radiology: No results found.   Procedures   Medications Ordered in the ED  sodium chloride  0.9 % bolus 500 mL (has no administration in time range)    Clinical Course as of 11/30/23 1259  Tue Nov 30, 2023  1243 Patient with brief generalized tonic-clonic seizure observed in the ED.  This lasted less than 1 minute.  Patient had just been given Ativan  0.5 mg prior to noted seizure activity.  Ativan  was given secondary to continued agitation (suspected postictal confusion) with medical intervention.  Patient did not desaturate during her seizure.  Patient's seizure stopped without further intervention.  Keppra load ordered.  Neurology - Dr Voncile  -aware of case and will consult. [PM]    Clinical Course User Index [PM] Laurice Coy BROCKS, MD                                 Medical Decision Making Patient initially presented from the bus stop where she has found some unresponsive.  Patient's mental status improved during EMS transport to the ED.  On arrival to the ED, the patient was confused and possibly postictal.  Patient without prior history of seizure.  Patient without witnessed seizure activity.  Approximately 1 hour after arrival patient had a witnessed tonic-clonic seizure.  This lasted less than 1 minute.  Patient had been given 0.5 of Ativan  IV just prior to initiation of seizure.  Ativan  was administered for mild agitation -in this situation likely secondary to prolonged postictal state.  Patient did not desaturate during her seizure.  She did not have respiratory distress.  Minimal amount of secretions was suctioned during her seizure.  Keppra load was started after cessation of seizure.  Neurology is aware of case and will consult.  Patient will require admission for further  workup of seizure.  Medicine team made aware of case.  Amount and/or Complexity of Data Reviewed Labs: ordered. Radiology: ordered.  Risk Prescription drug management.   CRITICAL CARE Performed by: Coy BROCKS Laurice   Total critical care time: 30 minutes  Critical care time was exclusive of separately billable procedures and treating other patients.  Critical care was necessary to treat or prevent imminent or life-threatening deterioration.  Critical care was time spent personally by me on the following activities: development of treatment plan with patient and/or surrogate as well as nursing, discussions with consultants, evaluation of patient's response to treatment, examination of patient, obtaining history from patient  or surrogate, ordering and performing treatments and interventions, ordering and review of laboratory studies, ordering and review of radiographic studies, pulse oximetry and re-evaluation of patient's condition.      Final diagnoses:  Seizure [facility])    ED Discharge Orders     None          Laurice Maude BROCKS, MD 11/30/23 1402

## 2023-11-30 NOTE — ED Notes (Signed)
 Called floor to let them pt will up shortly.

## 2023-11-30 NOTE — Assessment & Plan Note (Signed)
 UDS + for MJ

## 2023-11-30 NOTE — Telephone Encounter (Signed)
 Copied from CRM [id number]. Topic: Clinical - Medication Question >> Nov 29, 2023  2:55 PM Myrick T wrote: Reason for CRM: patient called stated she need a refill on amLODipine  (NORVASC ) 10 MG tablet. The pharmacy advised she was not eligible for a refill until 12/13/23. Patients brother said he had been dosing her meds and he does not remember her picking up the medication on 10/10. Please f/u with patient

## 2023-11-30 NOTE — Assessment & Plan Note (Addendum)
 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

## 2023-11-30 NOTE — Telephone Encounter (Signed)
 Call placed to pharmacy and rep states that medication was filled on 11/10/23

## 2023-11-30 NOTE — ED Triage Notes (Signed)
 PT to etc via [facility] with co being slumped over and unresponsive at a bus stop.  PT was also drooling upon ems arrival.  PER EMS when started the IV pt became more aware and had a GCS of 10.

## 2023-11-30 NOTE — Progress Notes (Signed)
 STAT routine EEG complete. Results pending.

## 2023-11-30 NOTE — ED Notes (Signed)
 Dr. Laurice  notified of I stat lactic of 2.12

## 2023-11-30 NOTE — Assessment & Plan Note (Signed)
 BP stable Titrate home regimen

## 2023-12-01 ENCOUNTER — Observation Stay (HOSPITAL_COMMUNITY)

## 2023-12-01 DIAGNOSIS — R569 Unspecified convulsions: Secondary | ICD-10-CM | POA: Diagnosis present

## 2023-12-01 DIAGNOSIS — Z8673 Personal history of transient ischemic attack (TIA), and cerebral infarction without residual deficits: Secondary | ICD-10-CM | POA: Diagnosis not present

## 2023-12-01 DIAGNOSIS — E785 Hyperlipidemia, unspecified: Secondary | ICD-10-CM | POA: Diagnosis present

## 2023-12-01 DIAGNOSIS — Z880 Allergy status to penicillin: Secondary | ICD-10-CM | POA: Diagnosis not present

## 2023-12-01 DIAGNOSIS — R9089 Other abnormal findings on diagnostic imaging of central nervous system: Secondary | ICD-10-CM | POA: Diagnosis not present

## 2023-12-01 DIAGNOSIS — Z79899 Other long term (current) drug therapy: Secondary | ICD-10-CM | POA: Diagnosis not present

## 2023-12-01 DIAGNOSIS — G40909 Epilepsy, unspecified, not intractable, without status epilepticus: Secondary | ICD-10-CM

## 2023-12-01 DIAGNOSIS — R930 Abnormal findings on diagnostic imaging of skull and head, not elsewhere classified: Secondary | ICD-10-CM | POA: Diagnosis not present

## 2023-12-01 DIAGNOSIS — G9340 Encephalopathy, unspecified: Secondary | ICD-10-CM | POA: Diagnosis not present

## 2023-12-01 DIAGNOSIS — R4182 Altered mental status, unspecified: Secondary | ICD-10-CM | POA: Diagnosis not present

## 2023-12-01 DIAGNOSIS — I10 Essential (primary) hypertension: Secondary | ICD-10-CM | POA: Diagnosis present

## 2023-12-01 DIAGNOSIS — F1210 Cannabis abuse, uncomplicated: Secondary | ICD-10-CM | POA: Diagnosis present

## 2023-12-01 DIAGNOSIS — F17210 Nicotine dependence, cigarettes, uncomplicated: Secondary | ICD-10-CM | POA: Diagnosis present

## 2023-12-01 LAB — COMPREHENSIVE METABOLIC PANEL WITH GFR
ALT: 16 U/L (ref 0–44)
AST: 20 U/L (ref 15–41)
Albumin: 3.5 g/dL (ref 3.5–5.0)
Alkaline Phosphatase: 75 U/L (ref 38–126)
Anion gap: 15 (ref 5–15)
BUN: 16 mg/dL (ref 8–23)
CO2: 19 mmol/L — ABNORMAL LOW (ref 22–32)
Calcium: 8.9 mg/dL (ref 8.9–10.3)
Chloride: 104 mmol/L (ref 98–111)
Creatinine, Ser: 0.9 mg/dL (ref 0.44–1.00)
GFR, Estimated: >60 mL/min (ref >60)
Glucose, Bld: 85 mg/dL (ref 70–99)
Potassium: 3.3 mmol/L — ABNORMAL LOW (ref 3.5–5.1)
Sodium: 138 mmol/L (ref 135–145)
Total Bilirubin: 1.3 mg/dL — ABNORMAL HIGH (ref 0.0–1.2)
Total Protein: 6.4 g/dL — ABNORMAL LOW (ref 6.5–8.1)

## 2023-12-01 LAB — CBC
HCT: 35.1 % — ABNORMAL LOW (ref 36.0–46.0)
Hemoglobin: 12 g/dL (ref 12.0–15.0)
MCH: 29.9 pg (ref 26.0–34.0)
MCHC: 34.2 g/dL (ref 30.0–36.0)
MCV: 87.5 fL (ref 80.0–100.0)
Platelets: 314 K/uL (ref 150–400)
RBC: 4.01 MIL/uL (ref 3.87–5.11)
RDW: 13.8 % (ref 11.5–15.5)
WBC: 7 K/uL (ref 4.0–10.5)
nRBC: 0 % (ref 0.0–0.2)

## 2023-12-01 NOTE — Progress Notes (Signed)
 NEUROLOGY CONSULT FOLLOW UP NOTE   Date of service: December 01, 2023 Patient Name: Raven Buckley MRN:  [id number] DOB:  12-Jul-1955  Interval Hx/subjective   Seen and examined.  Awake alert oriented x 3 today. No recollection of seizure yesterday.  Vitals   Vitals:   11/30/23 1959 12/01/23 0028 12/01/23 0453 12/01/23 0826  BP: 114/63 119/70 (!) 103/59 111/63  Pulse: 84 82 93 68  Resp:  20 20   Temp: 99.1 F (37.3 C) 98.9 F (37.2 C) 99.3 F (37.4 C) 99 F (37.2 C)  TempSrc: Oral Oral Oral Oral  SpO2: 96% 97% 97% 99%     There is no height or weight on file to calculate BMI.  Physical Exam   General: Awake alert in no distress HEENT: Normocephalic atraumatic Lungs: Clear Cardiovascular: Regular rate rhythm Neurological exam Awake alert oriented x 3 She is edentulous-but does not have frank dysarthria No aphasia Cranial nerves II to XII intact Motor examination with no drift in any of the 4 extremities Sensation intact to light touch Coordination examination reveals no dysmetria.   Medications  Current Facility-Administered Medications:    acetaminophen  (TYLENOL ) tablet 650 mg, 650 mg, Oral, Q6H PRN, Eldonna Elspeth PARAS, MD   enoxaparin  (LOVENOX ) injection 40 mg, 40 mg, Subcutaneous, Q24H, Eldonna Elspeth PARAS, MD   levETIRAcetam (KEPPRA) undiluted injection 500 mg, 500 mg, Intravenous, Q12H, Shafer, Devon, NP, 500 mg at 11/30/23 2131   LORazepam  (ATIVAN ) injection 0.5 mg, 0.5 mg, Intravenous, Q4H PRN, Eldonna Elspeth PARAS, MD  Labs and Diagnostic Imaging   CBC:  Recent Labs  Lab 11/30/23 1217 11/30/23 1226 12/01/23 0414  WBC 6.6  --  7.0  NEUTROABS 5.1  --   --   HGB 12.4 13.6 12.0  HCT 38.2 40.0 35.1*  MCV 90.7  --  87.5  PLT 327  --  314    Basic Metabolic Panel:  Lab Results  Component Value Date   NA 138 12/01/2023   K 3.3 (L) 12/01/2023   CO2 19 (L) 12/01/2023   GLUCOSE 85 12/01/2023   BUN 16 12/01/2023   CREATININE 0.90 12/01/2023   CALCIUM  8.9  12/01/2023   GFRNONAA >60 12/01/2023   GFRAA 110 03/11/2020   Lipid Panel:  Lab Results  Component Value Date   LDLCALC 110 (H) 05/06/2021   HgbA1c:  Lab Results  Component Value Date   HGBA1C 6.0 (H) 05/06/2021   Urine Drug Screen:     Component Value Date/Time   LABOPIA NONE DETECTED 11/30/2023 1227   COCAINSCRNUR NONE DETECTED 11/30/2023 1227   LABBENZ NONE DETECTED 11/30/2023 1227   AMPHETMU NONE DETECTED 11/30/2023 1227   THCU POSITIVE (A) 11/30/2023 1227   LABBARB NONE DETECTED 11/30/2023 1227    Alcohol Level     Component Value Date/Time   ETH <15 11/30/2023 1217   Labs from earlier this year have positive RPR and Treponema pallidum antibodies. Also has low B12 levels in the past.  CT Head without contrast(Personally reviewed): No acute findings  MRI Brain(Personally reviewed): IMPRESSION: 1. Abnormal signal in the splenium of the corpus callosum is nonspecific. Favor non-acute ischemia (see #2) but can also be seen in the setting of renal failure, hypertension, metabolic disturbances (including hypoglycemia, hyponatremia), seizure, infection, demyelination, radiation, and chemotherapy. 2. Underlying moderately advanced chronic small vessel disease.  Spot EEG negative for seizures.  Generalized slowing.   Assessment   Raven Buckley is a 68 y.o. female past history of hypertension found unresponsive by  bystanders at a bus stop and started to come around in the ambulance and then had a seizure in the hospital.  Suspect initial presentation was also seizure per the EDP. Has had a prior episode of unclear encephalopathy which in hindsight may have been seizure or postictal state. Started on Keppra yesterday EEG done stat-unremarkable for seizures Imaging revealed abnormal signal in the splenium of the corpus callosum which can carry a broad differential including seizures and metabolic derangements such as A87 deficiency and thiamine deficiency. She does not  have any clinical concern for infection requiring emergent LP. She does not have a history documented of alcoholism but Marchiafava Bignami Disease can also cause such lesions which is commonly seen in heavy alcohol users.  I will check some more labs She had a positive RPR and Treponema pallidum bodies.  Discussed with ID on-call-they do not think it has any clear clinical significance.  Neurosyphilis highly unlikely with her RPR titer of 1:1.  Impression:  -Likely seizure disorder with 2nd or 3rd episode now. -Abnormal MRI imaging finding carries a broad differential and is likely secondary to metabolic derangements or seizure  Recommendations  Maintain seizure precautions Keppra 500 twice daily Check B12, thiamine, folate-replete if negative.  Keep B12 greater than 400. Outpatient neurology follow-up in 8 to 12 weeks Plan relayed to Dr. Soledad  ______________________________________________________________________   Signed, Eligio Lav, MD [facility]  SEIZURE PRECAUTIONS Per [place]  [place] statutes, patients with seizures are not allowed to drive until they have been seizure-free for six months.   Use caution when using heavy equipment or power tools. Avoid working on ladders or at heights. Take showers instead of baths. Ensure the water temperature is not too high on the home water heater. Do not go swimming alone. Do not lock yourself in a room alone (i.e. bathroom). When caring for infants or small children, sit down when holding, feeding, or changing them to minimize risk of injury to the child in the event you have a seizure. Maintain good sleep hygiene. Avoid alcohol.    If patient has another seizure, call 911 and bring them back to the ED if: A.  The seizure lasts longer than 5 minutes.      B.  The patient doesn't wake shortly after the seizure or has new problems such as difficulty seeing, speaking or moving following the seizure C.  The patient was injured  during the seizure D.  The patient has a temperature over 102 F (39C) E.  The patient vomited during the seizure and now is having trouble breathing

## 2023-12-01 NOTE — Evaluation (Signed)
 Physical Therapy Evaluation Patient Details Name: Raven Buckley MRN: [id number] DOB: 1955/10/29 Today's Date: 12/01/2023  History of Present Illness  Pt is a 68 y.o. female presenting [facility] EMS on 10/28 with encephalopathy, witnessed seizure at the bus stop. 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 PMH: hypertension, hyperlipidemia  Clinical Impression  PTA pt living alone in single story apartment with 2 steps to enter. Pt reports independence with mobility without AD, using bus for transportation. Pt independent with ADLs, and family assist with pill box management, pt does her own shopping and light cooking. Pt is restless in her bed, and slightly anxious in novel hospital room setting. Pt redirected by her brother Raven Buckley. Pt is supervision for bed mobility. When PT added gait belt for safety and provided contact guard assist for transfers and ambulation pt with increased erratic movement requiring more assistance than likely is her normal. Pt's brother shares his concern for patient, wanting her to be independent but also wanting to make sure she is safe, taking her medication and getting the nutrition she needs. OT introduces services of [facility] as a possible resource for pt and family. PT in agreement, and recommends HHPT for improved mobility while family applies to [facility] program. PT will continue to follow acutely.       If plan is discharge home, recommend the following: A little help with walking and/or transfers;A little help with bathing/dressing/bathroom;Assistance with cooking/housework;Direct supervision/assist for medications management;Direct supervision/assist for financial management;Assist for transportation;Help with stairs or ramp for entrance   Can travel by private vehicle    Yes    Equipment Recommendations None recommended by PT     Functional Status Assessment Patient has had a recent decline in their functional status and  demonstrates the ability to make significant improvements in function in a reasonable and predictable amount of time.     Precautions / Restrictions Precautions Precautions: Fall Recall of Precautions/Restrictions: Impaired Restrictions Weight Bearing Restrictions Per Provider Order: No      Mobility  Bed Mobility Overal bed mobility: Needs Assistance Bed Mobility: Rolling, Supine to Sit, Sit to Supine Rolling: Independent   Supine to sit: Supervision Sit to supine: Supervision   General bed mobility comments: Supervision for safety due to impulsivity with movement and decreased safety awareness    Transfers Overall transfer level: Needs assistance Equipment used: None (gait belt) Transfers: Sit to/from Stand, Bed to chair/wheelchair/BSC Sit to Stand: Min assist   Step pivot transfers: Min assist, Mod assist       General transfer comment: Pt requires min-modA for erratic movment in response to gait belt and PT assistance    Ambulation/Gait Ambulation/Gait assistance: Min assist, Mod assist Gait Distance (Feet): 5 Feet Assistive device: None Gait Pattern/deviations: Step-through pattern, Shuffle, Decreased step length - right, Decreased step length - left Gait velocity: too fast for condition Gait velocity interpretation: <1.8 ft/sec, indicate of risk for recurrent falls   General Gait Details: min-modA for steadying but mostly due to erractic movement assosciated with PT holding gait belt for safety      Balance Overall balance assessment: Needs assistance Sitting-balance support: Single extremity supported, No upper extremity supported, Feet supported Sitting balance-Leahy Scale: Good     Standing balance support: No upper extremity supported, During functional activity Standing balance-Leahy Scale: Fair  Pertinent Vitals/Pain Pain Assessment Pain Assessment: No/denies pain    Home Living Family/patient expects  to be discharged to:: Private residence Living Arrangements: Alone Available Help at Discharge: Family;Available PRN/intermittently Type of Home: Apartment Home Access: Stairs to enter   Entrance Stairs-Number of Steps: 2   Home Layout: One level Home Equipment: Shower seat Additional Comments: Pt's brother lives about 20 minuntes away from pt. He calls to check on her daily and visits pt at least 1x per week to set up pill box. Brother reports their sister also lives nearby and provides PRN support for pt.    Prior Function Prior Level of Function : Independent/Modified Independent;Needs assist             Mobility Comments: Pt reports she is Independent without an AD. Pt's brother reports concern regarding pt safety due to baseline impaired safety awareness and impulsivity. ADLs Comments: Pt is Independent to Mod I with ADLs at baseline. Per is also able to perform light meal prep; however, pt's brother reports pt does not eat or drink consistently despite family attempts to encourage her to do so. Pt's brother sets up a weekly pill box for pt; however, pt and brother report pt does not always take medicaiton as it was set up. Brother reports family assists with home management tasks. Pt primarily uses the bus for mobility and has not had any issues with this per pt and brother. Brother calls to check on pt every day.     Extremity/Trunk Assessment   Upper Extremity Assessment Upper Extremity Assessment: Defer to OT evaluation    Lower Extremity Assessment Lower Extremity Assessment: Overall WFL for tasks assessed       Communication   Communication Communication: No apparent difficulties    Cognition Arousal: Alert Behavior During Therapy: Impulsive, Flat affect, Restless                             Following commands: Intact       Cueing Cueing Techniques: Verbal cues, Gestural cues, Visual cues     General Comments General comments (skin integrity,  edema, etc.): VSS on RA, brother in room assists with home set up and PLOF, very supportive of patient and worried for her welfare, PT/OT recommending application for [facility] to help with medication management, therapy and socialization        Assessment/Plan    PT Assessment Patient needs continued PT services  PT Problem List Decreased activity tolerance;Decreased balance;Decreased mobility;Decreased safety awareness       PT Treatment Interventions DME instruction;Gait training;Stair training;Functional mobility training;Therapeutic activities;Therapeutic exercise;Balance training;Cognitive remediation;Patient/family education    PT Goals (Current goals can be found in the Care Plan section)  Acute Rehab PT Goals PT Goal Formulation: With patient/family Time For Goal Achievement: 12/15/23 Potential to Achieve Goals: Fair    Frequency Min 3X/week     Co-evaluation   Reason for Co-Treatment: For patient/therapist safety;To address functional/ADL transfers;Other (comment) (To maximize pt activity tolerance) PT goals addressed during session: Mobility/safety with mobility OT goals addressed during session: ADL's and self-care       AM-PAC PT 6 Clicks Mobility  Outcome Measure Help needed turning from your back to your side while in a flat bed without using bedrails?: None Help needed moving from lying on your back to sitting on the side of a flat bed without using bedrails?: None Help needed moving to and from a bed to a  chair (including a wheelchair)?: A Little Help needed standing up from a chair using your arms (e.g., wheelchair or bedside chair)?: A Little Help needed to walk in hospital room?: A Little Help needed climbing 3-5 steps with a railing? : A Lot 6 Click Score: 19    End of Session Equipment Utilized During Treatment: Gait belt Activity Tolerance: Treatment limited secondary to agitation Patient left: in bed;with call bell/phone within reach;with  bed alarm set;with family/visitor present Nurse Communication: Mobility status PT Visit Diagnosis: Unsteadiness on feet (R26.81);Difficulty in walking, not elsewhere classified (R26.2);Adult, failure to thrive (R62.7)    Time: 1340-1401 PT Time Calculation (min) (ACUTE ONLY): 21 min   Charges:   PT Evaluation $PT Eval Low Complexity: 1 Low   PT General Charges $$ ACUTE PT VISIT: 1 Visit         Kieren Ricci B. Fleeta Lapidus PT, DPT Acute Rehabilitation Services Please use secure chat or  Call Office [phone]   Almarie KATHEE Fleeta Fleet 12/01/2023, 5:00 PM

## 2023-12-01 NOTE — Care Management Obs Status (Signed)
 MEDICARE OBSERVATION STATUS NOTIFICATION   Patient Details  Name: Raven Buckley MRN: [id number] Date of Birth: May 19, 1955   Medicare Observation Status Notification Given:  Yes  Verbally reviewed observation notice with [name] Sprang telephonically at [phone]. Will mail a copy to the patient home address.    Jerrion Tabbert 12/01/2023, 3:52 PM

## 2023-12-01 NOTE — Plan of Care (Signed)
 Patient accidentally pulled IV out in right AC. Telemetry leads pulled off multiple times. Patient's family called for updates. Patient did not urinate this shift, bladdr scan revealed 151 mL, hospitalist notified.   Problem: Education: Goal: Knowledge of General Education information will improve Description: Including pain rating scale, medication(s)/side effects and non-pharmacologic comfort measures Outcome: Progressing   Problem: Health Behavior/Discharge Planning: Goal: Ability to manage health-related needs will improve Outcome: Progressing   Problem: Clinical Measurements: Goal: Ability to maintain clinical measurements within normal limits will improve Outcome: Progressing   Problem: Activity: Goal: Risk for activity intolerance will decrease Outcome: Progressing   Problem: Nutrition: Goal: Adequate nutrition will be maintained Outcome: Progressing   Problem: Coping: Goal: Level of anxiety will decrease Outcome: Progressing   Problem: Pain Managment: Goal: General experience of comfort will improve and/or be controlled Outcome: Progressing   Problem: Safety: Goal: Ability to remain free from injury will improve Outcome: Progressing

## 2023-12-01 NOTE — Progress Notes (Signed)
  Progress Note   Patient: Raven Buckley FMW:[id number] DOB: 04-19-1955 DOA: 11/30/2023     0 DOS: the patient was seen and examined on 12/01/2023   Brief hospital course: The patient is a 68 yr old woman who presented to [facility] ED on 11/30/2023 with complaint of having been found unresponsive, staring, and drooling at a bus stop. EMS was called and the patient had a 45-60 second generalized tonic-clonic seizure that resolved with Ativan  in the ED. The patient has had similar episodes in the past.  The patient has a past medical history of hypertension, hyperlipidemia, and apparently pontine CVA.  She was admitted to a telemetry bed with seizure precautions. She has been started on Keppra. Neurology was consulted. The EEG does not demonstrate seizure activity. Dr. Arora recommends continued keppra and seizure precautions. The patient will have Vit B 12, Thiamine, and Folate. B 12 level will be maintained at greater than 400.  Assessment and Plan: Acute encephalopathy Seizure  Pt noted to have been found at local bus stop lethargic and drooling with symptomatic improvement after IVF  + witnessed GTC seizure in the ER  S/p IV keppra load  CTA head and neck with remote infarct in the right dorsal pons  UDS + for MJ  Neurology consulted  EEG pending  Check ammonia level  Follow up neurology recommendations    Marijuana abuse UDS + for MJ    HLD (hyperlipidemia) Resume statin pending CK     Hypertension BP stable  Titrate home regimen          Subjective: The patient is resting comfortably. No new complaints. Of note the patient has no memory of yesterday or the past even going back to last weekend.  Physical Exam: Vitals:   12/01/23 0453 12/01/23 0826 12/01/23 1144 12/01/23 1145  BP: (!) 103/59 111/63 (!) 101/57 (!) 101/57  Pulse: 93 68 69 70  Resp: 20     Temp: 99.3 F (37.4 C) 99 F (37.2 C) 98.7 F (37.1 C) 98.7 F (37.1 C)  TempSrc: Oral Oral Oral Oral  SpO2:  97% 99% 98% 96%   Exam:  Constitutional:  The patient is awake, alert, and oriented x 3. No acute distress. Respiratory:  No increased work of breathing. No wheezes, rales, or rhonchi No tactile fremitus Cardiovascular:  Regular rate and rhythm No murmurs, ectopy, or gallups. No lateral PMI. No thrills. Abdomen:  Abdomen is soft, non-tender, non-distended No hernias, masses, or organomegaly Normoactive bowel sounds.  Musculoskeletal:  No cyanosis, clubbing, or edema Skin:  No rashes, lesions, ulcers palpation of skin: no induration or nodules Neurologic:  CN 2-12 intact Sensation all 4 extremities intact Psychiatric:  Mental status Mood, affect appropriate Orientation to person, place, time  judgment and insight appear intact  Data Reviewed:  CBC, BMP  Family Communication: Brother at bedside. Consults: Neurology  Disposition: Status is: Inpatient Remains inpatient appropriate because: Seizure disorder with risk of further seizure.  Planned Discharge Destination: Home    Time spent: 36 minutes  Author: Vinessa Macconnell, DO 12/01/2023 2:20 PM  For on call review [email or web address].

## 2023-12-01 NOTE — Evaluation (Signed)
 Occupational Therapy Evaluation Patient Details Name: Raven Buckley MRN: [id number] DOB: 09-08-1955 Today's Date: 12/01/2023   History of Present Illness   Pt is a 68 y.o. female presenting to [facility] via EMS on 10/28 with encephalopathy, witnessed seizure at the bus stop. 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 PMH: hypertension, hyperlipidemia     Clinical Impressions At baseline, pt is Ind to Mod I for ADLs and light meal prep, Ind for functional mobility, and receives assistance from family for IADLs, including assist to set up a pill box. However, pt and her brother report that pt does not always take medication as set up for her. Pt's brother also expresses concern that pt does not always consistently eat and drink water despite encouragement from family. Pt currently presents with decreased balance, impaired cognition including decreased safety awareness, frequent impulsivity and erratic movements, and decreased safety and independence with functional tasks. Pt currently demonstrating ability to complete ADLs largely Ind to Min assist, bed mobility with Supervision, and Min to Mod assist without an AD with use of a gait belt for safety due to pt impulsivity and erratic movements. Pt VSS on RA. Pt's brother present, very supportive, and expressing concern for pt's safety, medication management, nutrition, and overall health. Pt will benefit from acute OT to address deficits and increase safety and independence with functional tasks. Post acute discharge, pt will benefit from [facility] OT. OT also recommends pt participation in [facility] or similar community-based program to improve pt safety, socialization, medication management, overall health, and long-term management of chronic health conditions.      If plan is discharge home, recommend the following:   A little help with bathing/dressing/bathroom;A lot of help with walking and/or transfers;Assistance with  cooking/housework;Direct supervision/assist for medications management;Direct supervision/assist for financial management;Assist for transportation;Help with stairs or ramp for entrance;Supervision due to cognitive status     Functional Status Assessment   Patient has had a recent decline in their functional status and demonstrates the ability to make significant improvements in function in a reasonable and predictable amount of time.     Equipment Recommendations   None recommended by OT (Pt already has needed equipment)     Recommendations for Other Services         Precautions/Restrictions   Precautions Precautions: Fall Recall of Precautions/Restrictions: Impaired Restrictions Weight Bearing Restrictions Per Provider Order: No     Mobility Bed Mobility Overal bed mobility: Needs Assistance Bed Mobility: Rolling, Supine to Sit, Sit to Supine Rolling: Independent   Supine to sit: Supervision Sit to supine: Supervision   General bed mobility comments: Supervision for safety due to impulsivity with movement and decreased safety awareness    Transfers Overall transfer level: Needs assistance Equipment used: None (gait belt) Transfers: Sit to/from Stand, Bed to chair/wheelchair/BSC Sit to Stand: Min assist     Step pivot transfers: Min assist, Mod assist     General transfer comment: Pt requires Min-Mod assist for safety due to impulsivity and frequent erractic movements.      Balance Overall balance assessment: Needs assistance Sitting-balance support: Single extremity supported, No upper extremity supported, Feet supported Sitting balance-Leahy Scale: Good     Standing balance support: No upper extremity supported, During functional activity Standing balance-Leahy Scale: Fair                             ADL either performed or assessed with clinical judgement  ADL Overall ADL's : Needs assistance/impaired Eating/Feeding:  Independent;Sitting   Grooming: Contact guard assist;Standing   Upper Body Bathing: Supervision/ safety;Set up;Cueing for safety;Sitting   Lower Body Bathing: Contact guard assist;Minimal assistance;Cueing for safety;Sit to/from stand   Upper Body Dressing : Supervision/safety;Set up;Sitting   Lower Body Dressing: Contact guard assist;Sit to/from stand;Cueing for safety   Toilet Transfer: Grab bars;Minimal assistance;Ambulation;Regular Toilet (without an AD) Toilet Transfer Details (indicate cue type and reason): simulated at EOB Toileting- Clothing Manipulation and Hygiene: Modified independent;Sitting/lateral lean;Contact guard assist;Sit to/from stand;Cueing for safety Toileting - Clothing Manipulation Details (indicate cue type and reason): Mod I for peri care in sitting; Contact guard assist for clothing management in standing     Functional mobility during ADLs: Minimal assistance;Moderate assistance (without an AD; gait belt used; pt would likely benefit from use of an AD for mobility, but uncertain of pt's willingness to trial an AD as pt adamantly stated multiple times that she does not need an AD)       Vision   Vision Assessment?: No apparent visual deficits Additional Comments: Vision [facility] for tasks assessed; not formally screened or evaluated     Perception         Praxis         Pertinent Vitals/Pain Pain Assessment Pain Assessment: No/denies pain     Extremity/Trunk Assessment Upper Extremity Assessment Upper Extremity Assessment: Defer to OT evaluation   Lower Extremity Assessment Lower Extremity Assessment: Overall WFL for tasks assessed       Communication Communication Communication: No apparent difficulties   Cognition Arousal: Alert Behavior During Therapy: Impulsive, Flat affect, Restless Cognition: Cognition impaired   Orientation impairments: Situation (Pt oriented to self, family, place, and time. Pt reporting she is at the hospital due  to falling out, but was not aware that she had been at a busstop. Pt though she was at home or just outside her home.) Awareness: Intellectual awareness impaired, Online awareness impaired Memory impairment (select all impairments): Working civil service fast streamer, Short-term memory Attention impairment (select first level of impairment): Selective attention (Pt is easily internally distracted) Executive functioning impairment (select all impairments): Organization, Reasoning, Problem solving OT - Cognition Comments: Pt AAOx3 and largely participated well in session but initially required encouragement to participate as pt initially presented with self-limiting behaviors. Pt also demonstrating signs of restlessness and impulsivity throughout session. Pt with poor insight into deficits, poor safety awareness, and easily internally distracted.                 Following commands: Intact       Cueing  General Comments   Cueing Techniques: Verbal cues;Gestural cues;Visual cues  VSS on RA, brother in room assists with home set up and PLOF, very supportive of patient and worried for her welfare, PT/OT recommending application for [facility] to help with medication management, therapy and socialization   Exercises     Shoulder Instructions      Home Living Family/patient expects to be discharged to:: Private residence Living Arrangements: Alone Available Help at Discharge: Family;Available PRN/intermittently Type of Home: Apartment Home Access: Stairs to enter Entrance Stairs-Number of Steps: 2   Home Layout: One level     Bathroom Shower/Tub: Walk-in shower         Home Equipment: Shower seat   Additional Comments: Pt's brother lives about 20 minuntes away from pt. He calls to check on her daily and visits pt at least 1x per week to set up pill box. Brother  reports their sister also lives nearby and provides PRN support for pt.      Prior Functioning/Environment Prior Level of  Function : Independent/Modified Independent;Needs assist             Mobility Comments: Pt reports she is Independent without an AD. Pt's brother reports concern regarding pt safety due to baseline impaired safety awareness and impulsivity. ADLs Comments: Pt is Independent to Mod I with ADLs at baseline. Per is also able to perform light meal prep; however, pt's brother reports pt does not eat or drink consistently despite family attempts to encourage her to do so. Pt's brother sets up a weekly pill box for pt; however, pt and brother report pt does not always take medicaiton as it was set up. Brother reports family assists with home management tasks. Pt primarily uses the bus for mobility and has not had any issues with this per pt and brother. Brother calls to check on pt every day.    OT Problem List: Decreased activity tolerance;Impaired balance (sitting and/or standing);Decreased cognition;Decreased safety awareness;Decreased knowledge of precautions   OT Treatment/Interventions: Self-care/ADL training;Therapeutic exercise;DME and/or AE instruction;Therapeutic activities;Cognitive remediation/compensation;Patient/family education;Balance training      OT Goals(Current goals can be found in the care plan section)   Acute Rehab OT Goals Patient Stated Goal: to return home and stay independent; brother expresses goal for pt to continue to be as independent as possible, but also to be safe at home and in the community OT Goal Formulation: With patient/family Time For Goal Achievement: 12/15/23 Potential to Achieve Goals: Fair ADL Goals Pt Will Perform Grooming: with modified independence;standing Pt Will Perform Lower Body Bathing: with supervision;sit to/from stand Pt Will Perform Lower Body Dressing: with modified independence;sitting/lateral leans;sit to/from stand Pt Will Transfer to Toilet: with modified independence;regular height toilet;ambulating Pt Will Perform Toileting -  Clothing Manipulation and hygiene: with modified independence;sit to/from stand;sitting/lateral leans Additional ADL Goal #1: Patient will demonstrate ability to safely gather supplies in room in preparation for ADLs with Mod I.   OT Frequency:  Min 2X/week    Co-evaluation PT/OT/SLP Co-Evaluation/Treatment: Yes Reason for Co-Treatment: For patient/therapist safety;To address functional/ADL transfers;Other (comment) (To maximize pt activity tolerance) PT goals addressed during session: Mobility/safety with mobility OT goals addressed during session: ADL's and self-care      AM-PAC OT 6 Clicks Daily Activity     Outcome Measure Help from another person eating meals?: None Help from another person taking care of personal grooming?: A Little Help from another person toileting, which includes using toliet, bedpan, or urinal?: A Little Help from another person bathing (including washing, rinsing, drying)?: A Little Help from another person to put on and taking off regular upper body clothing?: A Little Help from another person to put on and taking off regular lower body clothing?: A Little 6 Click Score: 19   End of Session Equipment Utilized During Treatment: Gait belt Nurse Communication: Mobility status;Other (comment) (OT POC. OT recommends PACE or similar program post discharge for increased safety, socialization, and improved medication management)  Activity Tolerance: Other (comment) (Session limited by restlessness and impulsivity) Patient left: in bed;with call bell/phone within reach;with bed alarm set;with family/visitor present  OT Visit Diagnosis: Other abnormalities of gait and mobility (R26.89);Unsteadiness on feet (R26.81);Other symptoms and signs involving cognitive function                Time: 1340-1401 OT Time Calculation (min): 21 min Charges:  OT General Charges $OT Visit: 1 Visit OT  Evaluation $OT Eval Low Complexity: 1 Low  Margarie Rockey HERO., OTR/L, [place] Acute  Rehab [phone]   Margarie FORBES Horns 12/01/2023, 5:50 PM

## 2023-12-01 NOTE — Plan of Care (Signed)

## 2023-12-01 NOTE — Progress Notes (Addendum)
 Transition of Care [facility]) - Inpatient Brief Assessment   Patient Details  Name: Raven Buckley MRN: [id number] Date of Birth: Jun 03, 1955  Transition of [facility] CM/SW Contact:    Rosaline JONELLE Joe, RN Phone Number: 12/01/2023, 10:03 AM   Clinical Narrative: Patient admitted to the hospital from local bus stop when she was found unresponsive and drooling.  Per notes, patient lives alone.  Recently history of seizure activity - PT/OT evaluation pending at this time.  CM will continue to follow for IP Care management needs as patient progresses.  12/01/23 1100 - I met with the patient and brother at the bedside.  Patient was alert and oriented and able to answer my questions.  Patients states that she lives alone and in independent using no DME at the home.  Patient uses bus and friend for transportation to appointments.  No DME at the home.  Patient has medication planner that her brother assists her with.  Patient is current smoker - resources and counseling provided.  Patient denies use of marijuana and ETOH.  Patient is pending - PT/OT eval at this time. Patient was provided with [facility] information for the patient's brother to follow up if interested in enrollment.  Patient's brother was offered Medicare choice regarding home health services and he did not have a preference.  I called Hedda CHEADLE and Darleene, RNCM accepted for [facility] PT, OT.  HH orders placed to be co-signed by MD.   Transition of Care Asessment: Insurance and Status: (P) Insurance coverage has been reviewed Patient has primary care physician: (P) Yes Home environment has been reviewed: (P) from home alone Prior level of function:: (P) self Prior/Current Home Services: (P) No current home services Social Drivers of Health Review: (P) SDOH reviewed needs interventions Readmission risk has been reviewed: (P) Yes Transition of care needs: (P) no transition of care needs at this time

## 2023-12-02 DIAGNOSIS — G9340 Encephalopathy, unspecified: Secondary | ICD-10-CM | POA: Diagnosis not present

## 2023-12-02 DIAGNOSIS — R569 Unspecified convulsions: Secondary | ICD-10-CM | POA: Diagnosis not present

## 2023-12-02 LAB — BASIC METABOLIC PANEL WITH GFR
Anion gap: 11 (ref 5–15)
BUN: 22 mg/dL (ref 8–23)
CO2: 25 mmol/L (ref 22–32)
Calcium: 9 mg/dL (ref 8.9–10.3)
Chloride: 106 mmol/L (ref 98–111)
Creatinine, Ser: 0.78 mg/dL (ref 0.44–1.00)
GFR, Estimated: >60 mL/min (ref >60)
Glucose, Bld: 100 mg/dL — ABNORMAL HIGH (ref 70–99)
Potassium: 3.3 mmol/L — ABNORMAL LOW (ref 3.5–5.1)
Sodium: 142 mmol/L (ref 135–145)

## 2023-12-02 LAB — VITAMIN B12: Vitamin B-12: 207 pg/mL (ref 180–914)

## 2023-12-02 LAB — FOLATE: Folate: >20 ng/mL (ref >5.9)

## 2023-12-02 MED ORDER — VITAMIN B-12 1000 MCG PO TABS
1000.0000 ug | ORAL_TABLET | Freq: Every day | ORAL | Status: DC
  Administered 2023-12-02 – 2023-12-03 (×2): 1000 ug via ORAL
  Filled 2023-12-02 (×2): qty 1

## 2023-12-02 MED ORDER — LEVETIRACETAM 500 MG PO TABS
500.0000 mg | ORAL_TABLET | Freq: Two times a day (BID) | ORAL | Status: DC
  Administered 2023-12-02 – 2023-12-03 (×3): 500 mg via ORAL
  Filled 2023-12-02 (×3): qty 1

## 2023-12-02 MED ORDER — FOLIC ACID 1 MG PO TABS
1.0000 mg | ORAL_TABLET | Freq: Every day | ORAL | Status: DC
  Administered 2023-12-02 – 2023-12-03 (×2): 1 mg via ORAL
  Filled 2023-12-02 (×2): qty 1

## 2023-12-02 NOTE — Progress Notes (Signed)
  Progress Note   Patient: Raven Buckley FMW:[id number] DOB: 03-Aug-1955 DOA: 11/30/2023     1 DOS: the patient was seen and examined on 12/02/2023   Brief hospital course: The patient is a 68 yr old woman who presented to [facility] ED on 11/30/2023 with complaint of having been found unresponsive, staring, and drooling at a bus stop. EMS was called and the patient had a 45-60 second generalized tonic-clonic seizure that resolved with Ativan  in the ED. The patient has had similar episodes in the past.  The patient has a past medical history of hypertension, hyperlipidemia, and apparently pontine CVA.  She was admitted to a telemetry bed with seizure precautions. She has been started on Keppra. Neurology was consulted. The EEG does not demonstrate seizure activity. Dr. Arora recommends continued keppra and seizure precautions. The patient had Vit B 12, Thiamine, and Folate levels checked. She was found to be deficient in B 12 and folate. These have been supplemented. Thiamine level is pending.   Assessment and Plan: Acute encephalopathy Seizure  Pt noted to have been found at local bus stop lethargic and drooling with symptomatic improvement after IVF  + witnessed GTC seizure in the ER  S/p IV keppra load  CTA head and neck with remote infarct in the right dorsal pons  UDS + for MJ  Neurology consulted  EEG pending  Check ammonia level  Follow up neurology recommendations    Marijuana abuse UDS + for MJ    HLD (hyperlipidemia) Resume statin pending CK     Hypertension BP stable  Titrate home regimen    Encephalopathy:  The patient had Vit B 12, Thiamine, and Folate levels checked. She was found to be deficient in B 12 and folate. These have been supplemented. Thiamine level is pending.       Subjective: The patient is resting comfortably. No new complaints.   Physical Exam: Vitals:   12/02/23 0802 12/02/23 1200 12/02/23 1207 12/02/23 1525  BP: 125/63 125/71 134/72 127/70   Pulse: 93 67  62  Resp:      Temp: 98.2 F (36.8 C) 98.3 F (36.8 C)  98.8 F (37.1 C)  TempSrc: Oral Oral  Oral  SpO2: 100%   100%   Exam:  Constitutional:  The patient is awake, alert, and oriented x 3. No acute distress. Respiratory:  No increased work of breathing. No wheezes, rales, or rhonchi No tactile fremitus Cardiovascular:  Regular rate and rhythm No murmurs, ectopy, or gallups. No lateral PMI. No thrills. Abdomen:  Abdomen is soft, non-tender, non-distended No hernias, masses, or organomegaly Normoactive bowel sounds.  Musculoskeletal:  No cyanosis, clubbing, or edema Skin:  No rashes, lesions, ulcers palpation of skin: no induration or nodules Neurologic:  CN 2-12 intact Sensation all 4 extremities intact Psychiatric:  Mental status Mood, affect appropriate Orientation to person, place, time  judgment and insight appear intact  Data Reviewed:  CBC, BMP  Family Communication: Brother at bedside. Consults: Neurology  Disposition: Status is: Inpatient Remains inpatient appropriate because: Seizure disorder with risk of further seizure.  Planned Discharge Destination: Home with home health.  Time spent: 34 minutes  Author: Jahnaya Branscome, DO 12/02/2023 4:16 PM  For on call review [email or web address].

## 2023-12-02 NOTE — Progress Notes (Signed)
 Mobility Specialist: Progress Note   12/02/23 1400  Mobility  Activity Ambulated with assistance  Level of Assistance Contact guard assist, steadying assist  Assistive Device None  Distance Ambulated (ft) 350 ft  Activity Response Tolerated well  Mobility Referral Yes  Mobility visit 1 Mobility  Mobility Specialist Start Time (ACUTE ONLY) 0935  Mobility Specialist Stop Time (ACUTE ONLY) 0946  Mobility Specialist Time Calculation (min) (ACUTE ONLY) 11 min    Pt received in bed, agreeable to mobility session. CGA throughout. Mild unsteadiness but no overt LOB. No complaints. Returned to room. Left in bed with all needs met, call bell in reach.   Ileana Lute Mobility Specialist Please contact via SecureChat or Rehab office at [phone]

## 2023-12-02 NOTE — Plan of Care (Signed)
 Chart check note  Labs pending Please follow recommendations from the progress note from yesterday Please call back with questions as needed  Eligio Lav, MD Neurology

## 2023-12-02 NOTE — Plan of Care (Signed)

## 2023-12-02 NOTE — Progress Notes (Signed)
 Occupational Therapy Treatment Patient Details Name: Raven Buckley MRN: [id number] DOB: February 22, 1955 Today's Date: 12/02/2023   History of present illness Pt is a 68 y.o. female presenting to [facility] via EMS on 10/28 with encephalopathy, witnessed seizure at the bus stop. 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 PMH: hypertension, hyperlipidemia, current smoker., alcohol use disorder.   OT comments  Brother at bedside, unaware of any prior seizures in pt's history. He does describe a fall in which she was confused a few months back. He has been assisting with meds prior to admission and has concerns about pt living alone. He reports that pt sleeps a lot. CM has provided information on PACE. Pt's insurance does not cover life alert. Pt does not have an iphone for use of location services with her brother. Recommended brother set up MyChart so he may follow pt's appointments, hospital notes. Pt completed bed mobility without difficulty. Donned gown with set up. Ambulated to bathroom with CGA. Managed toileting mod I and grooming with CGA at sink. Continue to recommend HHOT.       If plan is discharge home, recommend the following:  A little help with bathing/dressing/bathroom;A lot of help with walking and/or transfers;Assistance with cooking/housework;Direct supervision/assist for medications management;Direct supervision/assist for financial management;Assist for transportation;Help with stairs or ramp for entrance;Supervision due to cognitive status   Equipment Recommendations  None recommended by OT    Recommendations for Other Services      Precautions / Restrictions Precautions Precautions: Fall;Other (comment) (seizure) Recall of Precautions/Restrictions: Impaired Restrictions Weight Bearing Restrictions Per Provider Order: No       Mobility Bed Mobility Overal bed mobility: Independent             General bed mobility comments: HOB flat     Transfers Overall transfer level: Needs assistance Equipment used: None Transfers: Sit to/from Stand Sit to Stand: Contact guard assist           General transfer comment: from bed and toilet     Balance Overall balance assessment: Needs assistance   Sitting balance-Leahy Scale: Good     Standing balance support: No upper extremity supported, During functional activity Standing balance-Leahy Scale: Fair                             ADL either performed or assessed with clinical judgement   ADL Overall ADL's : Needs assistance/impaired     Grooming: Wash/dry hands;Standing;Contact guard assist           Upper Body Dressing : Set up;Sitting Upper Body Dressing Details (indicate cue type and reason): front opening gown Lower Body Dressing: Set up;Sitting/lateral leans   Toilet Transfer: Contact guard assist;Ambulation;Regular Toilet   Toileting- Clothing Manipulation and Hygiene: Modified independent       Functional mobility during ADLs: Contact guard assist      Extremity/Trunk Assessment              Vision       Perception     Praxis     Communication Communication Communication: No apparent difficulties   Cognition Arousal: Alert Behavior During Therapy: Impulsive Cognition: Cognition impaired   Orientation impairments: Situation Awareness: Intellectual awareness impaired, Online awareness impaired Memory impairment (select all impairments): Working civil service fast streamer, Short-term memory Attention impairment (select first level of impairment): Selective attention Executive functioning impairment (select all impairments): Organization, Reasoning, Problem solving OT - Cognition Comments: brother has been  assisting with meds, reports pt loses her phone repeatedly, pt had a fall a few months back                 Following commands: Intact        Cueing   Cueing Techniques: Verbal cues  Exercises      Shoulder Instructions        General Comments      Pertinent Vitals/ Pain       Pain Assessment Pain Assessment: No/denies pain  Home Living                                          Prior Functioning/Environment              Frequency  Min 2X/week        Progress Toward Goals  OT Goals(current goals can now be found in the care plan section)  Progress towards OT goals: Progressing toward goals  Acute Rehab OT Goals OT Goal Formulation: With patient/family Time For Goal Achievement: 12/15/23 Potential to Achieve Goals: Fair  Plan      Co-evaluation                 AM-PAC OT 6 Clicks Daily Activity     Outcome Measure   Help from another person eating meals?: None Help from another person taking care of personal grooming?: A Little Help from another person toileting, which includes using toliet, bedpan, or urinal?: A Little Help from another person bathing (including washing, rinsing, drying)?: A Little Help from another person to put on and taking off regular upper body clothing?: A Little Help from another person to put on and taking off regular lower body clothing?: A Little 6 Click Score: 19    End of Session Equipment Utilized During Treatment: Gait belt  OT Visit Diagnosis: Unsteadiness on feet (R26.81);Other symptoms and signs involving cognitive function   Activity Tolerance Patient tolerated treatment well   Patient Left in bed;with call bell/phone within reach;with bed alarm set;with family/visitor present   Nurse Communication          Time: 8576-8548 OT Time Calculation (min): 28 min  Charges: OT General Charges $OT Visit: 1 Visit OT Treatments $Self Care/Home Management : 23-37 mins  Mliss HERO, OTR/L Acute Rehabilitation Services Office: [phone]   Kennth Mliss Helling 12/02/2023, 3:03 PM

## 2023-12-03 ENCOUNTER — Other Ambulatory Visit (HOSPITAL_COMMUNITY): Payer: Self-pay

## 2023-12-03 DIAGNOSIS — G9340 Encephalopathy, unspecified: Secondary | ICD-10-CM | POA: Diagnosis not present

## 2023-12-03 LAB — BASIC METABOLIC PANEL WITH GFR
Anion gap: 14 (ref 5–15)
BUN: 14 mg/dL (ref 8–23)
CO2: 23 mmol/L (ref 22–32)
Calcium: 9.1 mg/dL (ref 8.9–10.3)
Chloride: 104 mmol/L (ref 98–111)
Creatinine, Ser: 0.65 mg/dL (ref 0.44–1.00)
GFR, Estimated: >60 mL/min (ref >60)
Glucose, Bld: 109 mg/dL — ABNORMAL HIGH (ref 70–99)
Potassium: 3.6 mmol/L (ref 3.5–5.1)
Sodium: 141 mmol/L (ref 135–145)

## 2023-12-03 MED ORDER — CYANOCOBALAMIN 1000 MCG PO TABS
1000.0000 ug | ORAL_TABLET | Freq: Every day | ORAL | 0 refills | Status: DC
  Filled 2023-12-03: qty 30, 30d supply, fill #0

## 2023-12-03 MED ORDER — LEVETIRACETAM 500 MG PO TABS
500.0000 mg | ORAL_TABLET | Freq: Two times a day (BID) | ORAL | 0 refills | Status: DC
  Filled 2023-12-03: qty 60, 30d supply, fill #0

## 2023-12-03 MED ORDER — FOLIC ACID 1 MG PO TABS
1.0000 mg | ORAL_TABLET | Freq: Every day | ORAL | 0 refills | Status: DC
  Filled 2023-12-03: qty 30, 30d supply, fill #0

## 2023-12-03 NOTE — Plan of Care (Signed)
 Folate greater than 20, B12 level 207, thiamine pending. Would replete B12 for a level greater than 400. EEG unremarkable. Continue Keppra 500 twice daily Maintain seizure precautions access follow-up with outpatient neurology  Eligio Lav, MD  Relayed to Dr. Soledad CURB [employer] Per [place]  DMV statutes, patients with seizures are not allowed to drive until they have been seizure-free for six months.   Use caution when using heavy equipment or power tools. Avoid working on ladders or at heights. Take showers instead of baths. Ensure the water temperature is not too high on the home water heater. Do not go swimming alone. Do not lock yourself in a room alone (i.e. bathroom). When caring for infants or small children, sit down when holding, feeding, or changing them to minimize risk of injury to the child in the event you have a seizure. Maintain good sleep hygiene. Avoid alcohol.    If patient has another seizure, call 911 and bring them back to the ED if: A.  The seizure lasts longer than 5 minutes.      B.  The patient doesn't wake shortly after the seizure or has new problems such as difficulty seeing, speaking or moving following the seizure C.  The patient was injured during the seizure D.  The patient has a temperature over 102 F (39C) E.  The patient vomited during the seizure and now is having trouble breathing

## 2023-12-03 NOTE — TOC Transition Note (Signed)
 Transition of Care [facility]) - Discharge Note   Patient Details  Name: Raven Buckley MRN: [id number] Date of Birth: [birth year]  [facility] CM/SW Contact:  Rosaline JONELLE Joe, RN Phone Number: 12/03/2023, 3:23 PM   Clinical Narrative:    CM met with the patient and brother at the bedside and patient is independent in the hospital room with no use of DME.  The patient's brother is aware that patient was set up with [facility] for home health services.  Brother was provided with [facility] brochure to follow up at a later date.  Patient is independent of ADL's in the room and also refuses placement and demands to go home.  Bedside nursing was asked to go over AVS with the patient's brother to discuss changes in medications.  No other IP care management needs.         Patient Goals and CMS Choice            Discharge Placement                       Discharge Plan and Services Additional resources added to the After Visit Summary for                                       Social Drivers of Health (SDOH) Interventions SDOH Screenings   Food Insecurity: No Food Insecurity (11/30/2023)  Housing: Low Risk  (11/30/2023)  Transportation Needs: No Transportation Needs (11/30/2023)  Utilities: Not At Risk (11/30/2023)  Alcohol Screen: Low Risk  (12/05/2021)  Depression (PHQ2-9): Low Risk  (06/23/2023)  Financial Resource Strain: Low Risk  (12/05/2021)  Physical Activity: Insufficiently Active (12/05/2021)  Social Connections: Patient Unable To Answer (11/30/2023)  Stress: No Stress Concern Present (12/05/2021)  Tobacco Use: High Risk (11/24/2023)   Received from [facility]     Readmission Risk Interventions     No data to display

## 2023-12-03 NOTE — Plan of Care (Signed)

## 2023-12-05 LAB — VITAMIN B1: Vitamin B1 (Thiamine): 94.6 nmol/L (ref 66.5–200.0)

## 2023-12-07 ENCOUNTER — Telehealth: Payer: Self-pay

## 2023-12-07 ENCOUNTER — Telehealth: Payer: Self-pay | Admitting: Family Medicine

## 2023-12-07 NOTE — Transitions of Care (Post Inpatient/ED Visit) (Signed)
   12/07/2023  Name: Raven Buckley MRN: [id number] DOB: 1955/12/08  Today's TOC FU Call Status: Today's TOC FU Call Status:: Unsuccessful Call (1st Attempt) Unsuccessful Call (1st Attempt) Date: 12/07/23  Attempted to reach the patient regarding the most recent Inpatient/ED visit.  Follow Up Plan: Additional outreach attempts will be made to reach the patient to complete the Transitions of Care (Post Inpatient/ED visit) call.   Signature  Slater Diesel, RN

## 2023-12-07 NOTE — Telephone Encounter (Signed)
 Called patient's brother, unable to reach. Left detailed voicemail for call back.

## 2023-12-07 NOTE — Telephone Encounter (Signed)
 Copied from CRM #[id number]. Topic: Appointments - Appointment Info/Confirmation >> Dec 07, 2023  2:53 PM Montie POUR wrote:  Patient/patient representative is calling for information regarding an appointment.  Ms. Ledwell discharged from hospital on 12/03/23 and needs a follow up appointment. There is not appointment earlier then 12/29/23.  Please call [name], brother, at [phone] to schedule.

## 2023-12-08 ENCOUNTER — Telehealth: Payer: Self-pay

## 2023-12-08 NOTE — Transitions of Care (Post Inpatient/ED Visit) (Signed)
 12/08/2023  Name: Raven Buckley MRN: [id number] DOB: 1955-10-24  Today's TOC FU Call Status: Today's TOC FU Call Status:: Successful TOC FU Call Completed Unsuccessful Call (1st Attempt) Date: 12/07/23 [facility] FU Call Complete Date: 12/08/23 Patient's Name and Date of Birth confirmed.  Transition Care Management Follow-up Telephone Call Date of Discharge: 12/03/23 Discharge Facility: [facility]) Type of Discharge: Inpatient Admission Primary Inpatient Discharge Diagnosis:: encephalopathy How have you been since you were released from the hospital?: Better (She said she is feeling pretty good and asked that I call her brother, Raven Buckley, for more information.) Any questions or concerns?: No  Items Reviewed: Did you receive and understand the discharge instructions provided?: No Medications obtained,verified, and reconciled?: Yes (Medications Reviewed) (At the patient's request, I reviewed the medications with her brother, Raven Buckley.  He said he sets up her meds in a pill box but he had not included the atorvastatin , so he said he will need to add that to the box today or tomorrow.) Any new allergies since your discharge?: No Dietary orders reviewed?: Yes Type of Diet Ordered:: heart healthy, low sodium Do you have support at home?: Yes People in Home [RPT]: alone Name of Support/Comfort Primary Source: She lives alone but said her brother, Raven Buckley, is very supportive.  Medications Reviewed Today: Medications Reviewed Today     Reviewed by Marvis Bradley, RN (Case Manager) on 12/08/23 at 1143  Med List Status: <None>   Medication Order Taking? Sig Documenting Provider Last Dose Status Informant  atorvastatin  (LIPITOR) 20 MG tablet [id number] No Take 1 tablet (20 mg total) by mouth daily. Odell Celinda Balo, MD Past Week Active Family Member, Pharmacy Records           Med Note EFRAIM ALFREIDA CROME   Tue Nov 30, 2023  3:25 PM)    cyanocobalamin  1000 MCG tablet [id number]  Take 1 tablet  (1,000 mcg total) by mouth daily. Swayze, Ava, DO  Active   folic acid (FOLVITE) 1 MG tablet [id number]  Take 1 tablet (1 mg total) by mouth daily. Swayze, Ava, DO  Active   levETIRAcetam (KEPPRA) 500 MG tablet [id number]  Take 1 tablet (500 mg total) by mouth 2 (two) times daily. Soledad Ligas, DO  Active   Med List Note (Cruthis, Sheffield, CPhT 12/01/23 9270): Raven Buckley (brother) fills the pill box weekly.             Home Care and Equipment/Supplies: Were Home Health Services Ordered?: Yes Name of Home Health Agency:: [facility] Has Agency set up a time to come to your home?: No EMR reviewed for Home Health Orders: Orders present/patient has not received call (refer to CM for follow-up) (I spoke to [employer] and she said she has tried multiple times to reach the patient and the patient hung up on her.  I instructed Chelsea to call patient's brother, Raven Buckley, and provided her with his number and Mitzie said she would call him.) Any new equipment or medical supplies ordered?: No  Functional Questionnaire: Do you need assistance with bathing/showering or dressing?: No Do you need assistance with meal preparation?: Yes (She said Raven Buckley assist with making sure she receives her meals) Do you need assistance with eating?: No Do you have difficulty maintaining continence: No Do you need assistance with getting out of bed/getting out of a chair/moving?: No Do you have difficulty managing or taking your medications?: Yes Samule manages her medications.)  Follow up appointments reviewed: PCP Follow-up appointment confirmed?: Yes Date of PCP follow-up  appointment?: 12/29/23 Follow-up Provider: Dr Newlin. Raven Buckley wanted her to be seen before 12/29/2023 and I offered to schedule her with Dr Tanda on 12/22/2023 but Raven Buckley did not want that appointment. He said he wants to keep the appointment with Dr Delbert and he will take his sister to the [facility] the [facility] on Mon, 12/13/2023. He is concerned about being able to  get refills of her medications when needed. I explained that the provider on the MMU can do the same assessment as the provider in the clinic and can order refills, make referrals, order tests as appropriate. Specialist Hospital Follow-up appointment confirmed?: No Reason Specialist Follow-Up Not Confirmed: Patient has Specialist Provider Number and will Call for Appointment Samule said he called Neurology and they told him that the appointment is pending and he stated he will call back to schedule that appointment.) Do you need transportation to your follow-up appointment?: No Do you understand care options if your condition(s) worsen?: Yes-patient verbalized understanding    SIGNATURE Slater Diesel, RN

## 2023-12-10 ENCOUNTER — Telehealth: Payer: Self-pay | Admitting: Family Medicine

## 2023-12-10 NOTE — Telephone Encounter (Signed)
 Copied from CRM #[id number]. Topic: Clinical - Home Health Verbal Orders >> Dec 10, 2023 11:16 AM Delon DASEN wrote: Caller/Agency: Signe with [facility] Number: [phone] secure Service Requested: Physical Therapy Frequency: not taking patient Any new concerns about the patient? Yes- went to see her today, will not be taking her on- concerns, bp mildly elevated, no symptoms 160/90, had not taken morning meds yet, pulse ox, unable to get oxygen reading, was doing fine having conversation, walked around fine, lungs clear, her brother will be bringing her to a mobile clinic, hospital started her on levETIRAcetam (KEPPRA) 500 MG tablet , neuro follow up in a few weeks, they do not know who will be refilling the levETIRAcetam (KEPPRA) 500 MG tablet

## 2023-12-10 NOTE — Telephone Encounter (Signed)
 Fyi   They are not accepting the patient.

## 2023-12-13 ENCOUNTER — Encounter: Payer: Self-pay | Admitting: Physician Assistant

## 2023-12-13 ENCOUNTER — Other Ambulatory Visit (HOSPITAL_COMMUNITY): Payer: Self-pay

## 2023-12-13 ENCOUNTER — Ambulatory Visit: Admitting: Physician Assistant

## 2023-12-13 VITALS — BP 183/102 | HR 83 | Wt 119.0 lb

## 2023-12-13 DIAGNOSIS — I10 Essential (primary) hypertension: Secondary | ICD-10-CM | POA: Diagnosis not present

## 2023-12-13 DIAGNOSIS — E785 Hyperlipidemia, unspecified: Secondary | ICD-10-CM

## 2023-12-13 DIAGNOSIS — E782 Mixed hyperlipidemia: Secondary | ICD-10-CM

## 2023-12-13 DIAGNOSIS — R569 Unspecified convulsions: Secondary | ICD-10-CM

## 2023-12-13 DIAGNOSIS — G40909 Epilepsy, unspecified, not intractable, without status epilepticus: Secondary | ICD-10-CM

## 2023-12-13 MED ORDER — LEVETIRACETAM 500 MG PO TABS
500.0000 mg | ORAL_TABLET | Freq: Two times a day (BID) | ORAL | 0 refills | Status: DC
  Filled 2023-12-13 – 2023-12-27 (×2): qty 60, 30d supply, fill #0

## 2023-12-13 MED ORDER — FOLIC ACID 1 MG PO TABS
1.0000 mg | ORAL_TABLET | Freq: Every day | ORAL | 0 refills | Status: DC
  Filled 2023-12-13 – 2023-12-27 (×2): qty 30, 30d supply, fill #0

## 2023-12-13 MED ORDER — ATORVASTATIN CALCIUM 20 MG PO TABS
20.0000 mg | ORAL_TABLET | Freq: Every day | ORAL | 3 refills | Status: AC
  Filled 2023-12-13 – 2023-12-27 (×2): qty 30, 30d supply, fill #0
  Filled 2024-01-30: qty 30, 30d supply, fill #1
  Filled 2024-02-19 – 2024-02-22 (×2): qty 30, 30d supply, fill #2

## 2023-12-13 MED ORDER — CARVEDILOL 25 MG PO TABS
25.0000 mg | ORAL_TABLET | Freq: Two times a day (BID) | ORAL | 1 refills | Status: AC
  Filled 2023-12-13 – 2023-12-27 (×2): qty 60, 30d supply, fill #0

## 2023-12-13 MED ORDER — BLOOD PRESSURE CUFF MISC: 1.0000 [IU] | Freq: Every day | 0 refills | Status: AC

## 2023-12-13 MED ORDER — AMLODIPINE BESYLATE 10 MG PO TABS
10.0000 mg | ORAL_TABLET | Freq: Every day | ORAL | 1 refills | Status: DC
  Filled 2023-12-13: qty 30, 30d supply, fill #0
  Filled 2024-01-30: qty 30, 30d supply, fill #1

## 2023-12-13 MED ORDER — CYANOCOBALAMIN 1000 MCG PO TABS
1000.0000 ug | ORAL_TABLET | Freq: Every day | ORAL | 0 refills | Status: DC
  Filled 2023-12-13: qty 30, 30d supply, fill #0

## 2023-12-13 NOTE — Progress Notes (Signed)
 Established Patient Office Visit  Subjective   Patient ID: Raven Buckley, female    DOB: 1955-03-04  Age: 68 y.o. MRN: [id number]  Chief Complaint  Patient presents with   Hospitalization Follow-up    Pt is in the clinic for HFU. Was hospitalized for decreased LOC. Rule out seizure   Discussed the use of AI scribe software for clinical note transcription with the patient, who gave verbal consent to proceed.  History of Present Illness   Raven Buckley is a 68 year old female with a recent seizure who presents for follow-up after hospitalization. She is accompanied by her brother, who assists with her medication management.  She experienced a seizure on November 30, 2023, leading to hospitalization for observation. During her stay, her blood pressure was low, resulting in the temporary discontinuation of her blood pressure medication. She is awaiting a neurology referral and is currently taking Keppra twice daily for seizure management, along with folic acid, vitamin B, and atorvastatin . She has enough Keppra for now but will need a refill soon.  She has a history of hypertension with previous high readings, including 223/113 in May 2025, which required an emergency room visit. Her medication regimen was adjusted, discontinuing lisinopril  hydrochlorothiazide  and continuing amlodipine  and carvedilol . She has difficulty consistently taking her medication, although her brother assists by dosing her medications weekly. Her blood pressure was 137/79 during a recent visit while on medication.   Past Medical History:  Diagnosis Date   Hyperlipidemia    Hypertension    Social History   Socioeconomic History   Marital status: Single    Spouse name: Not on file   Number of children: Not on file   Years of education: Not on file   Highest education level: Not on file  Occupational History   Not on file  Tobacco Use   Smoking status: Every Day    Current packs/day: 0.50    Types:  Cigarettes   Smokeless tobacco: Never  Substance and Sexual Activity   Alcohol use: No   Drug use: Yes    Types: Marijuana   Sexual activity: Not on file  Other Topics Concern   Not on file  Social History Narrative   Not on file   Social Drivers of Health   Financial Resource Strain: Low Risk  (12/05/2021)   Overall Financial Resource Strain (CARDIA)    Difficulty of Paying Living Expenses: Not hard at all  Food Insecurity: No Food Insecurity (12/13/2023)   Hunger Vital Sign    Worried About Running Out of Food in the Last Year: Never true    Ran Out of Food in the Last Year: Never true  Transportation Needs: No Transportation Needs (12/13/2023)   PRAPARE - Administrator, Civil Service (Medical): No    Lack of Transportation (Non-Medical): No  Physical Activity: Insufficiently Active (12/05/2021)   Exercise Vital Sign    Days of Exercise per Week: 7 days    Minutes of Exercise per Session: 20 min  Stress: No Stress Concern Present (12/05/2021)   [employer] of Occupational Health - Occupational Stress Questionnaire    Feeling of Stress : Not at all  Social Connections: Patient Unable To Answer (11/30/2023)   Social Connection and Isolation Panel    Frequency of Communication with Friends and Family: Patient unable to answer    Frequency of Social Gatherings with Friends and Family: Patient unable to answer    Attends Religious Services: Patient unable to  answer    Active Member of Clubs or Organizations: Patient unable to answer    Attends Club or Organization Meetings: Patient unable to answer    Marital Status: Patient unable to answer  Intimate Partner Violence: Unknown (12/13/2023)   Humiliation, Afraid, Rape, and Kick questionnaire    Fear of Current or Ex-Partner: No    Emotionally Abused: No    Physically Abused: No    Sexually Abused: Not on file   No family status information on file.   Allergies  Allergen Reactions   Penicillins Other (See  Comments)    Pt stated: I don't know- it just bothers me, but I did not have a rash or swelling.      Review of Systems  Constitutional: Negative.   HENT: Negative.    Eyes: Negative.   Respiratory:  Negative for shortness of breath.   Cardiovascular:  Negative for chest pain.  Gastrointestinal: Negative.   Genitourinary: Negative.   Musculoskeletal: Negative.   Skin: Negative.   Neurological:  Negative for dizziness, speech change and weakness.  Endo/Heme/Allergies: Negative.   Psychiatric/Behavioral: Negative.        Objective:     BP (!) 183/102 (BP Location: Left Arm, Patient Position: Sitting, Cuff Size: Normal)   Pulse 83   Wt 119 lb (54 kg)   SpO2 99%   BMI 24.04 kg/m  BP Readings from Last 3 Encounters:  12/13/23 (!) 183/102  12/03/23 128/66  10/26/23 137/79   Wt Readings from Last 3 Encounters:  12/13/23 119 lb (54 kg)  10/26/23 113 lb (51.3 kg)  06/23/23 114 lb 3.2 oz (51.8 kg)    Physical Exam Vitals and nursing note reviewed.    GENERAL: Alert, cooperative, well developed, no acute distress HEENT: Normocephalic, normal oropharynx, moist mucous membranes CHEST: Clear to auscultation bilaterally, No wheezes, rhonchi, or crackles CARDIOVASCULAR: Normal heart rate and rhythm, S1 and S2 normal without murmurs EXTREMITIES: No cyanosis or edema NEUROLOGICAL: Cranial nerves grossly intact, Moves all extremities without gross motor or sensory deficit   Assessment & Plan:   Problem List Items Addressed This Visit       Other   HLD (hyperlipidemia)   Relevant Medications   atorvastatin  (LIPITOR) 20 MG tablet   amLODipine  (NORVASC ) 10 MG tablet   carvedilol  (COREG ) 25 MG tablet   Other Visit Diagnoses       Elevated blood pressure reading in office with diagnosis of hypertension    -  Primary   Relevant Medications   atorvastatin  (LIPITOR) 20 MG tablet   amLODipine  (NORVASC ) 10 MG tablet   carvedilol  (COREG ) 25 MG tablet     Essential  hypertension       Relevant Medications   atorvastatin  (LIPITOR) 20 MG tablet   amLODipine  (NORVASC ) 10 MG tablet   carvedilol  (COREG ) 25 MG tablet   Blood Pressure Monitoring (BLOOD PRESSURE CUFF) MISC     Seizures (HCC)       Relevant Medications   levETIRAcetam (KEPPRA) 500 MG tablet   folic acid (FOLVITE) 1 MG tablet   cyanocobalamin  1000 MCG tablet       Assessment and Plan Essential hypertension Hypertension with recent hypotension episodes during hospitalization. Current elevated blood pressure necessitates restarting antihypertensive therapy. Noted difficulty with medication adherence. - Restarted amlodipine  and carvedilol . - Provided prescription for home blood pressure cuff. - Encouraged daily blood pressure monitoring. - Advised on medication adherence strategies - Encouraged hydration with four bottles of water daily. The patient was given clear  instructions to go to ER or return to medical center if symptoms don't improve, worsen or new problems develop. The patient verbalized understanding.    Seizure disorder Recent seizure on October 28th, with no prior history. Currently on Keppra. Awaiting neurology evaluation.  Patient given phone number for neurologist, encouraged to call to set up appointment. - Ensured adequate supply of Keppra. - Facilitated neurology referral.  Hyperlipidemia Managed with atorvastatin . - Continue atorvastatin  as prescribed.  Patient has follow-up appointment with primary care provider on November 26.  Encouraged to return to mobile unit as needed.  I have reviewed the patient's medical history (PMH, PSH, Social History, Family History, Medications, and allergies) , and have been updated if relevant. I spent 40 minutes reviewing chart and  face to face time with patient.    Return in about 16 days (around 12/29/2023) for At [facility].    Kirk RAMAN Mayers, PA-C

## 2023-12-13 NOTE — Patient Instructions (Addendum)
 [facility]  [address]  [place], [place] [address] Ph# [phone]         VISIT SUMMARY:  You had a follow-up appointment today after your recent hospitalization due to a seizure. We discussed your current medications and made some adjustments to better manage your health conditions.  YOUR PLAN:  -ESSENTIAL HYPERTENSION: Essential hypertension is high blood pressure with no identifiable cause. Your blood pressure was high during your recent hospitalization, so we have restarted your medications, amlodipine  and carvedilol . We also provided a prescription for a home blood pressure cuff and encouraged you to monitor your blood pressure daily. To help with medication adherence, we suggested strategies like weekly dosing and encouraged you to drink four bottles of water daily.  -SEIZURE DISORDER: A seizure disorder involves episodes of uncontrolled electrical activity in the brain. You had a seizure on October 28th and are currently taking Keppra to manage it. We ensured you have an adequate supply of Keppra and facilitated a referral to a neurologist for further evaluation.  -HYPERLIPIDEMIA: Hyperlipidemia is having high levels of fats (lipids) in your blood. You are currently managing this condition with atorvastatin , and we advised you to continue taking it as prescribed.  How to Take Your Blood Pressure Blood pressure is a measurement of how strongly your blood is pressing against the walls of your arteries. Arteries are blood vessels that carry blood from your heart throughout your body. Your health care provider takes your blood pressure at each office visit. You can also take your own blood pressure at home with a blood pressure monitor. You may need to take your own blood pressure to: Confirm a diagnosis of high blood pressure (hypertension). Monitor your blood pressure over time. Make sure your blood pressure medicine is working. Supplies needed: Blood pressure  monitor. A chair to sit in. This should be a chair where you can sit upright with your back supported. Do not sit on a soft couch or an armchair. Table or desk. Small notebook and pencil or pen. How to prepare To get the most accurate reading, avoid the following for 30 minutes before you check your blood pressure: Drinking caffeine. Drinking alcohol. Eating. Smoking. Exercising. Five minutes before you check your blood pressure: Use the bathroom and urinate so that you have an empty bladder. Sit quietly in a chair. Do not talk. How to take your blood pressure To check your blood pressure, follow the instructions in the manual that came with your blood pressure monitor. If you have a digital blood pressure monitor, the instructions may be as follows: Sit up straight in a chair. Place your feet on the floor. Do not cross your ankles or legs. Rest your left arm at the level of your heart on a table or desk or on the arm of a chair. Pull up your shirt sleeve. Wrap the blood pressure cuff around the upper part of your left arm, 1 inch (2.5 cm) above your elbow. It is best to wrap the cuff around bare skin. Fit the cuff snugly, but not too tightly, around your arm. You should be able to place only one finger between the cuff and your arm. Position the cord so that it rests in the bend of your elbow. Press the power button. Sit quietly while the cuff inflates and deflates. Read the digital reading on the monitor screen and write the numbers down (record them) in a notebook. Wait 2-3 minutes, then repeat the steps, starting at step  1. What does my blood pressure reading mean? A blood pressure reading consists of a higher number over a lower number. Ideally, your blood pressure should be below 120/80. The first (top) number is called the systolic pressure. It is a measure of the pressure in your arteries as your heart beats. The second (bottom) number is called the diastolic pressure. It is a  measure of the pressure in your arteries as the heart relaxes. Blood pressure is classified into four stages. The following are the stages for adults who do not have a short-term serious illness or a chronic condition. Systolic pressure and diastolic pressure are measured in a unit called mm Hg (millimeters of mercury).  Normal Systolic pressure: below 120. Diastolic pressure: below 80. Elevated Systolic pressure: 120-129. Diastolic pressure: below 80. Hypertension stage 1 Systolic pressure: 130-139. Diastolic pressure: 80-89. Hypertension stage 2 Systolic pressure: 140 or above. Diastolic pressure: 90 or above. You can have elevated blood pressure or hypertension even if only the systolic or only the diastolic number in your reading is higher than normal. Follow these instructions at home: Medicines Take over-the-counter and prescription medicines only as told by your health care provider. Tell your health care provider if you are having any side effects from blood pressure medicine. General instructions Check your blood pressure as often as recommended by your health care provider. Check your blood pressure at the same time every day. Take your monitor to the next appointment with your health care provider to make sure that: You are using it correctly. It provides accurate readings. Understand what your goal blood pressure numbers are. Keep all follow-up visits. This is important. General tips Your health care provider can suggest a reliable monitor that will meet your needs. There are several types of home blood pressure monitors. Choose a monitor that has an arm cuff. Do not choose a monitor that measures your blood pressure from your wrist or finger. Choose a cuff that wraps snugly, not too tight or too loose, around your upper arm. You should be able to fit only one finger between your arm and the cuff. You can buy a blood pressure monitor at most drugstores or online. Where to  find more information American Heart Association: www.heart.org Contact a health care provider if: Your blood pressure is consistently high. Your blood pressure is suddenly low. Get help right away if: Your systolic blood pressure is higher than 180. Your diastolic blood pressure is higher than 120. These symptoms may be an emergency. Get help right away. Call 911. Do not wait to see if the symptoms will go away. Do not drive yourself to the hospital. Summary Blood pressure is a measurement of how strongly your blood is pressing against the walls of your arteries. A blood pressure reading consists of a higher number over a lower number. Ideally, your blood pressure should be below 120/80. Check your blood pressure at the same time every day. Avoid caffeine, alcohol, smoking, and exercise for 30 minutes prior to checking your blood pressure. These agents can affect the accuracy of the blood pressure reading. This information is not intended to replace advice given to you by your health care provider. Make sure you discuss any questions you have with your health care provider. Document Revised: 10/03/2020 Document Reviewed: 10/03/2020 Elsevier Patient Education  2024 [employer].

## 2023-12-13 NOTE — Telephone Encounter (Signed)
 Keppra was refilled by the mobile unit today.

## 2023-12-14 ENCOUNTER — Other Ambulatory Visit (HOSPITAL_COMMUNITY): Payer: Self-pay

## 2023-12-18 NOTE — Discharge Summary (Signed)
 Physician Discharge Summary   Patient: Raven Buckley MRN: [id number] DOB: [birth year]  Admit date:     11/30/2023  Discharge date: 12/03/2023  Discharge Physician: Brigida Bureau   PCP: Delbert Clam, MD   Recommendations at discharge:    Discharge to home with family.  No driving for 6 months or until cleared by neurology. Follow up with PCP in 7-10 days. Follow up with neurology in 4-5 weeks.  Discharge Diagnoses: Principal Problem:   Encephalopathy Active Problems:   Acute encephalopathy   Hypertension   HLD (hyperlipidemia)   Marijuana abuse  Resolved Problems:   * No resolved hospital problems. [facility] Course: The patient is a 68 yr old woman who presented to [facility] ED on 11/30/2023 with complaint of having been found unresponsive, staring, and drooling at a bus stop. EMS was called and the patient had a 45-60 second generalized tonic-clonic seizure that resolved with Ativan  in the ED. The patient has had similar episodes in the past.   The patient has a past medical history of hypertension, hyperlipidemia, and apparently pontine CVA.   She was admitted to a telemetry bed with seizure precautions. She has been started on Keppra. Neurology was consulted. The EEG does not demonstrate seizure activity. Dr. Arora recommends continued keppra and seizure precautions. The patient had Vit B 12, Thiamine, and Folate levels checked. She was found to be deficient in B 12 and folate. These have been supplemented.   The patient has been cleared for discharge to home by neurology. She will be discharged on seizure precautions as outlined by Dr. Arora.   Assessment and Plan: Acute encephalopathy Seizure  Pt noted to have been found at local bus stop lethargic and drooling with symptomatic improvement after IVF  + witnessed GTC seizure in the ER  S/p IV keppra load  CTA head and neck with remote infarct in the right dorsal pons  UDS + for MJ  Neurology consulted  EEG did not  demonstrate seizure activity, but Dr. Voncile has determined that the patient's event and her mental status upon presentation is due to a seizure. This is largely due to her witnessed seizures.  Ammonia level is negative. The patient will be discharged on keppra. She and her family are aware of seizure precautions.  Marijuana abuse UDS + for MJ   HLD (hyperlipidemia) Resume statin pending CK   Hypertension BP stable  Titrate home regimen   Consultants: Neurology Procedures performed: EEG  Disposition: Home Diet recommendation:  Discharge Diet Orders (From admission, onward)     Start     Ordered   12/03/23 0000  Diet - low sodium heart healthy        12/03/23 1306           Cardiac diet DISCHARGE MEDICATION: Allergies as of 12/03/2023       Reactions   Penicillins Other (See Comments)   Pt stated: I don't know- it just bothers me, but I did not have a rash or swelling.        Medication List     STOP taking these medications    amLODipine  10 MG tablet Commonly known as: NORVASC    buPROPion  150 MG 12 hr tablet Commonly known as: Wellbutrin  SR   carvedilol  25 MG tablet Commonly known as: COREG         Follow-up Information     Delbert Clam, MD Follow up.   Specialty: Family Medicine Why: Call the office and schedule a hospital follow up  in the next week. Contact information: [address] [place] [address] [place] [address] [phone]         [facility] Follow up.   Specialty: Home Health Services Why: [facility] will provide home health services.  They will call you in the next 24-48 hours to set up services. Contact information: [address] [place] [address] [phone]         [facility] Neurology. Schedule an appointment as soon as possible for a visit in 4 day(s).   Specialty: Neurology Contact information: [address] [place], [place] [address] [place]   [address] [phone]               Discharge Exam: There were no vitals filed for this visit. Exam:  Constitutional:  The patient is awake, alert, and oriented x 3. No acute distress. Eyes:  pupils and irises appear normal Normal lids and conjunctivae ENMT:  grossly normal hearing  Lips appear normal external ears, nose appear normal Oropharynx: mucosa, tongue,posterior pharynx appear normal Neck:  neck appears normal, no masses, normal ROM, supple no thyromegaly Respiratory:  No increased work of breathing. No wheezes, rales, or rhonchi No tactile fremitus Cardiovascular:  Regular rate and rhythm No murmurs, ectopy, or gallups. No lateral PMI. No thrills. Abdomen:  Abdomen is soft, non-tender, non-distended No hernias, masses, or organomegaly Normoactive bowel sounds.  Musculoskeletal:  No cyanosis, clubbing, or edema Skin:  No rashes, lesions, ulcers palpation of skin: no induration or nodules Neurologic:  CN 2-12 intact Sensation all 4 extremities intact Psychiatric:  Mental status Mood, affect appropriate Orientation to person, place, time  judgment and insight appear intact   Condition at discharge: fair  The results of significant diagnostics from this hospitalization (including imaging, microbiology, ancillary and laboratory) are listed below for reference.   Imaging Studies: MR BRAIN WO CONTRAST Result Date: 12/01/2023 EXAM: MRI BRAIN WITHOUT CONTRAST 12/01/2023 09:03:00 AM TECHNIQUE: Multiplanar multisequence MRI of the head/brain was performed without the administration of intravenous contrast. COMPARISON: CT head, CTA head and neck 11/30/2023. CLINICAL HISTORY: 68 year old female with mental status change of unknown cause. Patient was uncooperative during the exam. FINDINGS: BRAIN AND VENTRICLES: No mass. No midline shift. No hydrocephalus. The sella is unremarkable. Normal flow voids. Abnormal diffusion in the splenium of the corpus  callosum, asymmetric to the left (series 2 image 26) appears to be T2 shine through. No diffusion restriction. Small chronic lacunar infarcts in the bilateral deep gray nuclei. Larger probable chronic lacunar infarct in the dorsal right pons (series 10 image 9). Mild additional pontine T2 and FLAIR heterogeneity. Periventricular patchy T2 and FLAIR heterogeneity and some small periventricular areas which most resemble chronic white matter lacunar infarcts (series 10 image 21 on the left). Evidence on T2* of solitary chronic microhemorrhage in the left lentiform nucleus. Cerebellum relatively spared. No cortical encephalomalacia. ORBITS: No acute abnormality. SINUSES AND MASTOIDS: No acute abnormality. BONES AND SOFT TISSUES: Normal marrow signal. No acute soft tissue abnormality. Normal for age visible cervical spine normal bone marrow signal. INCIDENTAL FINDINGS: Mild splenomegaly. IMPRESSION: 1. Abnormal signal in the splenium of the corpus callosum is nonspecific. Favor non-acute ischemia (see #2) but can also be seen in the setting of renal failure, hypertension, metabolic disturbances (including hypoglycemia, hyponatremia), seizure, infection, demyelination, radiation, and chemotherapy. 2. Underlying moderately advanced chronic small vessel disease. Electronically signed by: Helayne Hurst MD 12/01/2023 09:49 AM EDT RP Workstation: [id number]   EEG adult  Result Date: 11/30/2023 Shelton Arlin KIDD, MD     11/30/2023  3:11 PM Patient Name: RAYOLA EVERHART MRN: [id number] Epilepsy Attending: Arlin KIDD Shelton Referring Physician/Provider: Remi Pippin, NP Date: 11/30/2023 Duration: 27.45 mins Patient history: a 68 y.o. female with a history of hypertension who was found unresponsive at a bus stop by bystanders. EEG to evaluate for seizure Level of alertness: Awake AEDs during EEG study: LEV Technical aspects: This EEG study was done with scalp electrodes positioned according to the 10-20 International system of  electrode placement. Electrical activity was reviewed with band pass filter of 1-70Hz , sensitivity of 7 uV/mm, display speed of 40mm/sec with a 60Hz  notched filter applied as appropriate. EEG data were recorded continuously and digitally stored.  Video monitoring was available and reviewed as appropriate. Description: EEG showed continuous generalized 3 to 6 Hz theta-delta slowing admixed with 12-15hz  beta activity. Hyperventilation and photic stimulation were not performed.   ABNORMALITY - Continuous slow, generalized IMPRESSION: This study is suggestive of generalized cerebral dysfunction (encephalopathy). No seizures or epileptiform discharges were seen throughout the recording. Priyanka KIDD Shelton   CT ANGIO HEAD NECK W WO CM Result Date: 11/30/2023 EXAM: CTA HEAD AND NECK WITH AND WITHOUT 11/30/2023 01:18:09 PM TECHNIQUE: CTA of the head and neck was performed with and without the administration of 75 mL of intravenous iohexol (OMNIPAQUE) 350 MG/ML injection. Multiplanar 2D and/or 3D reformatted images are provided for review. Automated exposure control, iterative reconstruction, and/or weight based adjustment of the mA/kV was utilized to reduce the radiation dose to as low as reasonably achievable. Stenosis of the internal carotid arteries measured using NASCET criteria. COMPARISON: CT head 05/30/2023 CLINICAL HISTORY: FINDINGS: CTA NECK: AORTIC ARCH AND ARCH VESSELS: Common origin of the brachiocephalic and left common carotid arteries. Mild atherosclerosis along the distal aspect of the aortic arch. No dissection or arterial injury. No significant stenosis of the brachiocephalic or subclavian arteries. CERVICAL CAROTID ARTERIES: No dissection or arterial injury. Mild atherosclerosis at the left carotid bifurcation without hemodynamically significant stenosis. The right common and internal carotid arteries are patent without hemodynamically significant stenosis by NASCET criteria. CERVICAL VERTEBRAL  ARTERIES: No dissection or arterial injury. Mild atherosclerosis of the proximal V4 segment of the right vertebral artery without significant stenosis. The left vertebral artery is patent without significant stenosis. LUNGS AND MEDIASTINUM: Unremarkable. SOFT TISSUES: Raph edentulous maxilla and mandible. No acute abnormality. BONES: No acute abnormality. CTA HEAD: ANTERIOR CIRCULATION: Mild atherosclerosis of the left carotid siphon without significant stenosis. The right internal carotid artery is patent without significant stenosis. No significant stenosis of the anterior cerebral arteries. No significant stenosis of the middle cerebral arteries. No aneurysm. POSTERIOR CIRCULATION: Fetal origin of the left PCA. No significant stenosis of the right posterior cerebral artery. No significant stenosis of the basilar artery. No significant stenosis of the vertebral arteries. No aneurysm. OTHER: There is a remote infarct in the right dorsal aspect of the pons which is new since the prior study. Mild chronic microvascular ischemic changes. No dural venous sinus thrombosis on this non-dedicated study. IMPRESSION: 1. Remote infarct in the right dorsal pons, new since 05/30/23. 2. No large vessel occlusion. 3. Mild chronic microvascular ischemic changes. 4. Mild atherosclerosis at the left carotid bifurcation, proximal V4 segment of the right vertebral artery, and left carotid siphon without hemodynamically significant stenosis. Electronically signed by: Donnice Mania MD 11/30/2023 01:59 PM EDT RP Workstation: [id number]   DG Chest Port 1 View Result Date: 11/30/2023 CLINICAL DATA:  Weakness EXAM:  PORTABLE CHEST 1 VIEW COMPARISON:  Chest x-ray performed Jun 29, 2023 FINDINGS: Patient is rotated. Heart mediastinum are not significantly changed. Low lung volumes. No pleural effusion or pneumothorax. IMPRESSION: 1. Low lung volumes with bibasilar hypoventilatory changes. Electronically Signed   By: Maude Naegeli M.D.   On:  11/30/2023 13:52    Microbiology: Results for orders placed or performed during the hospital encounter of 10/12/23  Urine Culture     Status: Abnormal   Collection Time: 10/12/23  9:48 PM   Specimen: Urine, Clean Catch  Result Value Ref Range Status   Specimen Description   Final    URINE, CLEAN CATCH Performed at [facility], [address], [place], [place] [address]    Special Requests   Final    NONE Performed at [facility], [address] [place], [place] [address]    Culture MULTIPLE SPECIES PRESENT, SUGGEST RECOLLECTION (A)  Final   Report Status 10/14/2023 FINAL  Final    Labs: CBC: No results for input(s): WBC, NEUTROABS, HGB, HCT, MCV, PLT in the last 168 hours. Basic Metabolic Panel: No results for input(s): NA, K, CL, CO2, GLUCOSE, BUN, CREATININE, CALCIUM , MG, PHOS in the last 168 hours. Liver Function Tests: No results for input(s): AST, ALT, ALKPHOS, BILITOT, PROT, ALBUMIN in the last 168 hours. CBG: No results for input(s): GLUCAP in the last 168 hours.  Discharge time spent: greater than 30 minutes.  Signed: Amoria Mclees, DO [facility] 12/18/2023

## 2023-12-24 ENCOUNTER — Encounter: Payer: Self-pay | Admitting: Neurology

## 2023-12-27 ENCOUNTER — Other Ambulatory Visit (HOSPITAL_COMMUNITY): Payer: Self-pay

## 2023-12-27 ENCOUNTER — Other Ambulatory Visit: Payer: Self-pay

## 2023-12-29 ENCOUNTER — Other Ambulatory Visit: Payer: Self-pay

## 2023-12-29 ENCOUNTER — Encounter: Payer: Self-pay | Admitting: Family Medicine

## 2023-12-29 ENCOUNTER — Other Ambulatory Visit (HOSPITAL_COMMUNITY): Payer: Self-pay

## 2023-12-29 ENCOUNTER — Ambulatory Visit: Attending: Family Medicine | Admitting: Family Medicine

## 2023-12-29 VITALS — BP 128/73 | HR 76 | Temp 98.4°F | Ht 59.0 in | Wt 123.8 lb

## 2023-12-29 DIAGNOSIS — R569 Unspecified convulsions: Secondary | ICD-10-CM | POA: Insufficient documentation

## 2023-12-29 DIAGNOSIS — E2839 Other primary ovarian failure: Secondary | ICD-10-CM | POA: Diagnosis not present

## 2023-12-29 DIAGNOSIS — I10 Essential (primary) hypertension: Secondary | ICD-10-CM

## 2023-12-29 DIAGNOSIS — F17219 Nicotine dependence, cigarettes, with unspecified nicotine-induced disorders: Secondary | ICD-10-CM | POA: Diagnosis not present

## 2023-12-29 DIAGNOSIS — Z1231 Encounter for screening mammogram for malignant neoplasm of breast: Secondary | ICD-10-CM | POA: Diagnosis not present

## 2023-12-29 DIAGNOSIS — Z8673 Personal history of transient ischemic attack (TIA), and cerebral infarction without residual deficits: Secondary | ICD-10-CM | POA: Insufficient documentation

## 2023-12-29 MED ORDER — PNEUMOCOCCAL VAC POLYVALENT 25 MCG/0.5ML IJ SOSY
0.5000 mL | PREFILLED_SYRINGE | Freq: Once | INTRAMUSCULAR | 0 refills | Status: AC
  Filled 2023-12-29: qty 0.5, 1d supply, fill #0

## 2023-12-29 MED ORDER — LEVETIRACETAM 500 MG PO TABS
500.0000 mg | ORAL_TABLET | Freq: Two times a day (BID) | ORAL | 0 refills | Status: DC
  Filled 2023-12-29 – 2024-01-20 (×2): qty 60, 30d supply, fill #0

## 2023-12-29 NOTE — Patient Instructions (Signed)
 VISIT SUMMARY:  You had a follow-up appointment today after experiencing a seizure last month. During your hospitalization, you were started on Keppra  and have not had any further seizures. Your blood pressure medication was stopped in the hospital due to low readings but was restarted at home when your blood pressure increased. You occasionally smoke cigarettes but do not drink alcohol or use drugs.  YOUR PLAN:  -SEIZURE DISORDER: A seizure disorder means you have had a seizure, but the exact cause is still being investigated. You should continue taking Keppra  500 mg twice daily until you see the neurologist in January.  -ESSENTIAL HYPERTENSION: Essential hypertension is high blood pressure without a known cause. Your blood pressure is well-controlled with your current medication. Please continue your current medication, maintain a home blood pressure log, and ensure you are using the proper technique for measuring your blood pressure.  -REMOTE RIGHT PONTINE INFARCT: A remote right pontine infarct is an old stroke in a specific area of your brain. This may be related to your recent seizure. We will wait for further evaluation by the neurologist.  -TOBACCO USE: Smoking even a small amount can have significant health impacts. It is strongly advised that you quit smoking. Please consider smoking cessation options.  -GENERAL HEALTH MAINTENANCE: You are due for a pneumonia vaccination, a mammogram, and a bone density screening. A prescription for the pneumonia vaccine has been sent to your pharmacy, and orders for the mammogram and bone density screening have been placed.  INSTRUCTIONS:  Please follow up with the neurologist in January for further evaluation of your seizure disorder. Continue taking Keppra  500 mg twice daily. Maintain a home blood pressure log and ensure proper technique for measuring your blood pressure. Get your pneumonia vaccination, mammogram, and bone density screening as  ordered.

## 2023-12-29 NOTE — Progress Notes (Signed)
 Subjective:  Patient ID: Raven Buckley, female    DOB: Sep 28, 1955  Age: 68 y.o. MRN: [id number]  CC: Medical Management of Chronic Issues (Discuss hospital l visit )     Discussed the use of AI scribe software for clinical note transcription with the patient, who gave verbal consent to proceed.  History of Present Illness Raven Buckley is a 68 year old female with a history of hypertension, right pontine infarct, nicotine  dependence who presents for follow-up after a seizure episode.  Last month, she had a seizure at a bus stop found to be unresponsive, staring and drooling, EMS witnessed a couple of second episode of tonic-clonic seizure which resolved with Ativan . She was started on Keppra  500 mg twice daily during hospitalization and has not had further seizures since discharge. An EEG did not show epileptiform discharges but this was suggestive of generalized cerebral dysfunction (encephalopathy) discharges consistent with seizures. A CT angiogram of the head showed a remote infarct in the right dorsal pons new since 05/2023.  During hospitalization, her blood pressure was low and her blood pressure medication was stopped. After returning home her blood pressure increased, so she restarted her medication after her visit to the mobile unit. Home readings since then range from 144/73 to 120/65.  She occasionally smokes one or two cigarettes. She does not drink alcohol and does not use cocaine or marijuana. She is accompanied by her brother to this visit and he assists her with medication administration.   Past Medical History:  Diagnosis Date   Hyperlipidemia    Hypertension     No past surgical history on file.  No family history on file.  Social History   Socioeconomic History   Marital status: Single    Spouse name: Not on file   Number of children: Not on file   Years of education: Not on file   Highest education level: Not on file  Occupational History   Not on file   Tobacco Use   Smoking status: Every Day    Current packs/day: 0.50    Types: Cigarettes   Smokeless tobacco: Never  Substance and Sexual Activity   Alcohol use: No   Drug use: Yes    Types: Marijuana   Sexual activity: Not on file  Other Topics Concern   Not on file  Social History Narrative   Not on file   Social Drivers of Health   Financial Resource Strain: Low Risk  (12/05/2021)   Overall Financial Resource Strain (CARDIA)    Difficulty of Paying Living Expenses: Not hard at all  Food Insecurity: No Food Insecurity (12/13/2023)   Hunger Vital Sign    Worried About Running Out of Food in the Last Year: Never true    Ran Out of Food in the Last Year: Never true  Transportation Needs: No Transportation Needs (12/13/2023)   PRAPARE - Administrator, Civil Service (Medical): No    Lack of Transportation (Non-Medical): No  Physical Activity: Insufficiently Active (12/05/2021)   Exercise Vital Sign    Days of Exercise per Week: 7 days    Minutes of Exercise per Session: 20 min  Stress: No Stress Concern Present (12/05/2021)   [employer] of Occupational Health - Occupational Stress Questionnaire    Feeling of Stress : Not at all  Social Connections: Patient Unable To Answer (11/30/2023)   Social Connection and Isolation Panel    Frequency of Communication with Friends and Family: Patient unable to answer  Frequency of Social Gatherings with Friends and Family: Patient unable to answer    Attends Religious Services: Patient unable to answer    Active Member of Clubs or Organizations: Patient unable to answer    Attends Banker Meetings: Patient unable to answer    Marital Status: Patient unable to answer    Allergies  Allergen Reactions   Penicillins Other (See Comments)    Pt stated: I don't know- it just bothers me, but I did not have a rash or swelling.      Outpatient Medications Prior to Visit  Medication Sig Dispense Refill    amLODipine  (NORVASC ) 10 MG tablet Take 1 tablet (10 mg total) by mouth daily. 30 tablet 1   atorvastatin  (LIPITOR) 20 MG tablet Take 1 tablet (20 mg total) by mouth daily. 30 tablet 3   Blood Pressure Monitoring (BLOOD PRESSURE CUFF) MISC 1 Units by Does not apply route daily. 1 each 0   carvedilol  (COREG ) 25 MG tablet Take 1 tablet (25 mg total) by mouth 2 (two) times daily with a meal. 60 tablet 1   cyanocobalamin  1000 MCG tablet Take 1 tablet (1,000 mcg total) by mouth daily. 30 tablet 0   folic acid  (FOLVITE ) 1 MG tablet Take 1 tablet (1 mg total) by mouth daily. 30 tablet 0   levETIRAcetam  (KEPPRA ) 500 MG tablet Take 1 tablet (500 mg total) by mouth 2 (two) times daily. 60 tablet 0   No facility-administered medications prior to visit.     ROS Review of Systems  Constitutional:  Negative for activity change and appetite change.  HENT:  Negative for sinus pressure and sore throat.   Respiratory:  Negative for chest tightness, shortness of breath and wheezing.   Cardiovascular:  Negative for chest pain and palpitations.  Gastrointestinal:  Negative for abdominal distention, abdominal pain and constipation.  Genitourinary: Negative.   Musculoskeletal: Negative.   Psychiatric/Behavioral:  Negative for behavioral problems and dysphoric mood.     Objective:  BP 128/73   Pulse 76   Temp 98.4 F (36.9 C) (Oral)   Ht 4' 11 (1.499 m)   Wt 123 lb 12.8 oz (56.2 kg)   SpO2 100%   BMI 25.00 kg/m      12/29/2023   10:37 AM 12/13/2023    8:42 AM 12/03/2023    8:23 AM  BP/Weight  Systolic BP 128 183 128  Diastolic BP 73 102 66  Wt. (Lbs) 123.8 119   BMI 25 kg/m2 24.04 kg/m2       Physical Exam Constitutional:      Appearance: She is well-developed.  Cardiovascular:     Rate and Rhythm: Normal rate.     Heart sounds: Normal heart sounds. No murmur heard. Pulmonary:     Effort: Pulmonary effort is normal.     Breath sounds: Normal breath sounds. No wheezing or rales.   Chest:     Chest wall: No tenderness.  Abdominal:     General: Bowel sounds are normal. There is no distension.     Palpations: Abdomen is soft. There is no mass.     Tenderness: There is no abdominal tenderness.  Musculoskeletal:        General: Normal range of motion.     Right lower leg: No edema.     Left lower leg: No edema.  Neurological:     Mental Status: She is alert and oriented to person, place, and time.  Psychiatric:  Mood and Affect: Mood normal.        Latest Ref Rng & Units 12/03/2023    8:57 AM 12/02/2023    9:02 AM 12/01/2023    4:14 AM  CMP  Glucose 70 - 99 mg/dL 890  899  85   BUN 8 - 23 mg/dL 14  22  16    Creatinine 0.44 - 1.00 mg/dL 9.34  9.21  9.09   Sodium 135 - 145 mmol/L 141  142  138   Potassium 3.5 - 5.1 mmol/L 3.6  3.3  3.3   Chloride 98 - 111 mmol/L 104  106  104   CO2 22 - 32 mmol/L 23  25  19    Calcium  8.9 - 10.3 mg/dL 9.1  9.0  8.9   Total Protein 6.5 - 8.1 g/dL   6.4   Total Bilirubin 0.0 - 1.2 mg/dL   1.3   Alkaline Phos 38 - 126 U/L   75   AST 15 - 41 U/L   20   ALT 0 - 44 U/L   16     Lipid Panel     Component Value Date/Time   CHOL 167 05/06/2021 0910   TRIG 61 05/06/2021 0910   HDL 45 05/06/2021 0910   CHOLHDL 4.3 03/18/2019 1857   VLDL 12 03/18/2019 1857   LDLCALC 110 (H) 05/06/2021 0910    CBC    Component Value Date/Time   WBC 7.0 12/01/2023 0414   RBC 4.01 12/01/2023 0414   HGB 12.0 12/01/2023 0414   HGB 11.9 05/06/2021 0910   HCT 35.1 (L) 12/01/2023 0414   HCT 36.2 05/06/2021 0910   PLT 314 12/01/2023 0414   PLT 344 05/06/2021 0910   MCV 87.5 12/01/2023 0414   MCV 89 05/06/2021 0910   MCH 29.9 12/01/2023 0414   MCHC 34.2 12/01/2023 0414   RDW 13.8 12/01/2023 0414   RDW 13.1 05/06/2021 0910   LYMPHSABS 1.2 11/30/2023 1217   LYMPHSABS 2.4 05/06/2021 0910   MONOABS 0.3 11/30/2023 1217   EOSABS 0.0 11/30/2023 1217   EOSABS 0.1 05/06/2021 0910   BASOSABS 0.0 11/30/2023 1217   BASOSABS 0.0 05/06/2021  0910    Lab Results  Component Value Date   HGBA1C 6.0 (H) 05/06/2021       Assessment & Plan Seizure disorder Recent seizure with no prior history. EEG revealed no epileptiform discharge. CT angiogram showed new remote infarct in right dorsal pons, possibly related to seizure. No clear etiology identified. -No recent seizures since discharge - Continue Keppra  500 mg twice daily until neurologist evaluation. - Follow-up with neurologist in January.  Essential hypertension Blood pressure well-controlled with medication. - Continue current antihypertensive regimen. - Maintain home blood pressure log. - Ensure proper technique for blood pressure measurement.  Remote right pontine infarct CT angiogram showed new remote infarct in right dorsal pons, possibly linked to recent seizure. -Secondary risk factor modification including blood pressure control -Continue statin - Await neurologist evaluation.  Tobacco use Continues to smoke one to two cigarettes daily. Smoking cessation advised due to health impact. - Encouraged smoking cessation. -Smoking cessation support: smoking cessation hotline: 1-800-QUIT-NOW.  Smoking cessation classes are available through [facility]. Call [phone] or visit our website at [email or web address].  Spent 3 minutes counseling on dangers of tobacco use and benefits of quitting, offered pharmacological intervention to aid quitting and patient is not ready to quit.   General Health Maintenance Due for pneumonia vaccination, mammogram, and bone density  screening. - Sent prescription for pneumonia vaccine to pharmacy. Screening for breast cancer- Ordered mammogram. Estrogen deficiency- Ordered bone density screening.     Meds ordered this encounter  Medications   levETIRAcetam  (KEPPRA ) 500 MG tablet    Sig: Take 1 tablet (500 mg total) by mouth 2 (two) times daily.    Dispense:  60 tablet    Refill:  0    Follow-up:  Return in about 6 months (around 06/27/2024) for Chronic medical conditions.       Corrina Sabin, MD, FAAFP. [facility] [place], [place] [phone]   12/29/2023, 1:01 PM

## 2024-01-20 ENCOUNTER — Other Ambulatory Visit (HOSPITAL_COMMUNITY): Payer: Self-pay

## 2024-01-22 ENCOUNTER — Other Ambulatory Visit: Payer: Self-pay | Admitting: Physician Assistant

## 2024-01-22 DIAGNOSIS — R569 Unspecified convulsions: Secondary | ICD-10-CM

## 2024-01-25 ENCOUNTER — Other Ambulatory Visit (HOSPITAL_COMMUNITY): Payer: Self-pay

## 2024-01-25 MED ORDER — FOLIC ACID 1 MG PO TABS
1.0000 mg | ORAL_TABLET | Freq: Every day | ORAL | 2 refills | Status: AC
  Filled 2024-01-25: qty 30, 30d supply, fill #0
  Filled 2024-02-19: qty 30, 30d supply, fill #1

## 2024-01-25 MED ORDER — CYANOCOBALAMIN 1000 MCG PO TABS
1000.0000 ug | ORAL_TABLET | Freq: Every day | ORAL | 2 refills | Status: AC
  Filled 2024-01-25: qty 30, 30d supply, fill #0
  Filled 2024-02-19: qty 30, 30d supply, fill #1

## 2024-01-28 ENCOUNTER — Ambulatory Visit
Admission: RE | Admit: 2024-01-28 | Discharge: 2024-01-28 | Disposition: A | Discharge status: Home / Self Care | Source: Ambulatory Visit | Attending: Family Medicine | Admitting: Family Medicine

## 2024-01-28 DIAGNOSIS — Z1231 Encounter for screening mammogram for malignant neoplasm of breast: Secondary | ICD-10-CM

## 2024-02-04 ENCOUNTER — Ambulatory Visit: Payer: Self-pay | Admitting: Family Medicine

## 2024-02-17 NOTE — Progress Notes (Signed)
 "  NEUROLOGY CONSULTATION NOTE  Raven Buckley MRN: [id number] DOB: [birth year]  Referring provider: Dr. Brigida Bureau Primary care provider: Dr. Corrina Sabin  Reason for consult:  new onset seizure  Dear Dr Bureau:  Thank you for your kind referral of Raven Buckley for consultation of the above symptoms. Although her history is well known to you, please allow me to reiterate it for the purpose of our medical record. The patient was accompanied to the clinic by her brother [name] who also provides collateral information. Records and images were personally reviewed where available.  Discussed the use of AI scribe software for clinical note transcription with the patient, who gave verbal consent to proceed.  History of Present Illness This is a 69 year old left-handed woman with a history of hypertension, hyperlipidemia, presenting for evaluation of seizure. Her brother Arley helps provide additional information. On 11/30/23, she was at the bus stop and noted to be unresponsive, drooling. In the ER, she had a GTC lasting 45-60 seconds. MRI brain without contrast showed moderately advanced chronic microvascular disease, there was abnormal signal in the splenium of the corpus callosum, favoring non-acute ischemia, but can also be seen with a range of differentials (renal failure, hypertension, metabolic disturbances, seizure, infection, demyelination, radiation, and chemotherapy). CTA head and neck no significant stenosis, there was some mild atherosclerosis. EEG showed diffuse slowing. She had a positive RPR and Treponema pallidum bodies.  Discussed with ID on-call-they do not think it has any clear clinical significance.  Neurosyphilis highly unlikely with her RPR titer of 1:1. Vitamin B12 level 207. She was discharged home on Levetiracetam  500mg  BID.   She was admitted for altered mental status in 05/2023 after family could not get in touch with her for 2 days. [employer] found her on the bathroom floor  agitated and confused. Workup was unremarkable at that time and cannabis was questioned as cause of encephalopathy. Her brother reports that her memory has been 5/10 since October but prior to this he did not have any concerns. He started managing her finances 5 years ago but states she did not have cognitive issues. She lives alone, she retired from [employer] in 2024 but states that she went to work Tuesday in her uniform. She states sometimes I forget something. [name] fixes her medications and comes by to make sure she takes her medications. She does not drive. No family history of dementia. Mood is okay. She denies any headaches, dizziness, diplopia, dysarthria, dysphagia, neck/back pain, bowel/bladder dysfunction. They deny any staring episodes, she denies any olfactory/gustatory hallucinations, focal numbness/tingling/weakness, myoclonic jerks. She usually gets 6 hours of sleep. No alcohol.   She had a normal birth and early development.  There is no history of febrile convulsions, CNS infections such as meningitis/encephalitis, significant traumatic brain injury, neurosurgical procedures, or family history of seizures.   PAST MEDICAL HISTORY: Past Medical History:  Diagnosis Date   Hyperlipidemia    Hypertension     PAST SURGICAL HISTORY: History reviewed. No pertinent surgical history.  MEDICATIONS: Medications Ordered Prior to Encounter[1]  ALLERGIES: Allergies[2]  FAMILY HISTORY: Family History  Problem Relation Age of Onset   Breast cancer Mother     SOCIAL HISTORY: Social History   Socioeconomic History   Marital status: Single    Spouse name: Not on file   Number of children: Not on file   Years of education: Not on file   Highest education level: Not on file  Occupational History   Not on  file  Tobacco Use   Smoking status: Every Day    Current packs/day: 0.50    Types: Cigarettes   Smokeless tobacco: Never  Substance and Sexual Activity   Alcohol use: No    Drug use: Yes    Types: Marijuana   Sexual activity: Not on file  Other Topics Concern   Not on file  Social History Narrative   Not on file   Social Drivers of Health   Tobacco Use: High Risk (12/29/2023)   Patient History    Smoking Tobacco Use: Every Day    Smokeless Tobacco Use: Never    Passive Exposure: Not on file  Financial Resource Strain: Low Risk (12/05/2021)   Overall Financial Resource Strain (CARDIA)    Difficulty of Paying Living Expenses: Not hard at all  Food Insecurity: No Food Insecurity (12/13/2023)   Epic    Worried About Programme Researcher, Broadcasting/film/video in the Last Year: Never true    Ran Out of Food in the Last Year: Never true  Transportation Needs: No Transportation Needs (12/13/2023)   Epic    Lack of Transportation (Medical): No    Lack of Transportation (Non-Medical): No  Physical Activity: Insufficiently Active (12/05/2021)   Exercise Vital Sign    Days of Exercise per Week: 7 days    Minutes of Exercise per Session: 20 min  Stress: No Stress Concern Present (12/05/2021)   [employer] of Occupational Health - Occupational Stress Questionnaire    Feeling of Stress : Not at all  Social Connections: Patient Unable To Answer (11/30/2023)   Social Connection and Isolation Panel    Frequency of Communication with Friends and Family: Patient unable to answer    Frequency of Social Gatherings with Friends and Family: Patient unable to answer    Attends Religious Services: Patient unable to answer    Active Member of Clubs or Organizations: Patient unable to answer    Attends Banker Meetings: Patient unable to answer    Marital Status: Patient unable to answer  Intimate Partner Violence: Unknown (12/13/2023)   Epic    Fear of Current or Ex-Partner: No    Emotionally Abused: No    Physically Abused: No    Sexually Abused: Not on file  Depression (PHQ2-9): Low Risk (12/29/2023)   Depression (PHQ2-9)    PHQ-2 Score: 0  Alcohol Screen: Low Risk  (12/05/2021)   Alcohol Screen    Last Alcohol Screening Score (AUDIT): 0  Housing: Unknown (12/13/2023)   Epic    Unable to Pay for Housing in the Last Year: No    Number of Times Moved in the Last Year: Not on file    Homeless in the Last Year: No  Utilities: Not At Risk (12/13/2023)   Epic    Threatened with loss of utilities: No  Health Literacy: Not on file     PHYSICAL EXAM: Vitals:   02/18/24 0954  BP: 120/72  Pulse: 84  SpO2: 98%   General: No acute distress, tearful Head:  Normocephalic/atraumatic Skin/Extremities: No rash, no edema Neurological Exam: Mental status: alert and oriented to person, place, and time, no dysarthria or aphasia, Fund of knowledge is appropriate.  Recent and remote memory are intact.  Attention and concentration are normal.    Able to name objects and repeat phrases. MMSE 27/30    02/18/2024   11:00 AM 12/05/2021    9:43 AM  MMSE - Mini Mental State Exam  Orientation to time 5 5  Orientation  to Place 5 5  Registration 3 3  Attention/ Calculation 3 5  Recall 3 3  Language- name 2 objects 2 2  Language- repeat 1 1  Language- follow 3 step command 3 3  Language- read & follow direction 1 1  Write a sentence 1 1  Copy design 0 1  Total score 27 30   Cranial nerves: CN I: not tested CN II: pupils equal, round, visual fields intact CN III, IV, VI:  full range of motion, no nystagmus, no ptosis CN V: facial sensation intact CN VII: upper and lower face symmetric CN VIII: hearing intact to conversation Bulk & Tone: normal, no fasciculations. Motor: 5/5 throughout with no pronator drift. Sensation: intact to light touch, cold, pin, vibration sense.  No extinction to double simultaneous stimulation.  Romberg test negative Deep Tendon Reflexes: brisk +2 both UE, +1 both LE Cerebellar: no incoordination on finger to nose testing Gait: narrow-based and steady, no ataxia Tremor: none  IMPRESSION: This is a 69 year old left-handed woman with  a history of hypertension, hyperlipidemia, presenting for evaluation of seizure. She had an episode of unresponsiveness with drooling on 11/30/23. There was also an admission for altered mental status in 05/2023 when she was found down at home with confusion. MRI brain showed an abnormal signal in the splenium of the corpus callosum, etiology unclear with broad different. Repeat MRI brain with and without contrast will be ordered for interval follow-up. No further seizures since 11/2023 on Levetiracetam  500mg  BID, no side effects. They report continued cognitive difficulties since then, MMSE today 27/30, Neurocognitive testing will be ordered to further evaluate. We discussed MRI potentially showing non-acute stroke, recommend starting daily aspirin 81mg , continue control of vascular risk factors. Continue close supervision, she does not drive. Follow-up in 3 months, call for any changes.    Thank you for allowing me to participate in the care of this patient. Please do not hesitate to call for any questions or concerns.   Darice Shivers, M.D.  CC: Dr. Newlin, Dr. Soledad     [1]  Current Outpatient Medications on File Prior to Visit  Medication Sig Dispense Refill   amLODipine  (NORVASC ) 10 MG tablet Take 1 tablet (10 mg total) by mouth daily. 30 tablet 1   atorvastatin  (LIPITOR) 20 MG tablet Take 1 tablet (20 mg total) by mouth daily. 30 tablet 3   Blood Pressure Monitoring (BLOOD PRESSURE CUFF) MISC 1 Units by Does not apply route daily. 1 each 0   carvedilol  (COREG ) 25 MG tablet Take 1 tablet (25 mg total) by mouth 2 (two) times daily with a meal. 60 tablet 1   cyanocobalamin  1000 MCG tablet Take 1 tablet (1,000 mcg total) by mouth daily. 30 tablet 2   folic acid  (FOLVITE ) 1 MG tablet Take 1 tablet (1 mg total) by mouth daily. 30 tablet 2   levETIRAcetam  (KEPPRA ) 500 MG tablet Take 1 tablet (500 mg total) by mouth 2 (two) times daily. 60 tablet 0   No current facility-administered medications  on file prior to visit.  [2]  Allergies Allergen Reactions   Penicillins Other (See Comments)    Pt stated: I don't know- it just bothers me, but I did not have a rash or swelling.     "

## 2024-02-18 ENCOUNTER — Encounter: Payer: Self-pay | Admitting: Neurology

## 2024-02-18 ENCOUNTER — Other Ambulatory Visit: Payer: Self-pay

## 2024-02-18 ENCOUNTER — Other Ambulatory Visit (HOSPITAL_COMMUNITY): Payer: Self-pay

## 2024-02-18 ENCOUNTER — Ambulatory Visit (INDEPENDENT_AMBULATORY_CARE_PROVIDER_SITE_OTHER): Admitting: Neurology

## 2024-02-18 VITALS — BP 120/72 | HR 84 | Ht 59.0 in | Wt 123.4 lb

## 2024-02-18 DIAGNOSIS — R569 Unspecified convulsions: Secondary | ICD-10-CM | POA: Diagnosis not present

## 2024-02-18 DIAGNOSIS — R4189 Other symptoms and signs involving cognitive functions and awareness: Secondary | ICD-10-CM | POA: Diagnosis not present

## 2024-02-18 DIAGNOSIS — R9089 Other abnormal findings on diagnostic imaging of central nervous system: Secondary | ICD-10-CM | POA: Diagnosis not present

## 2024-02-18 MED ORDER — LEVETIRACETAM 500 MG PO TABS
500.0000 mg | ORAL_TABLET | Freq: Two times a day (BID) | ORAL | 11 refills | Status: AC
  Filled 2024-02-18 (×2): qty 60, 30d supply, fill #0

## 2024-02-18 NOTE — Patient Instructions (Signed)
 Good to meet you.  Schedule repeat MRI brain with and without contrast  2. Schedule Neurocognitive testing  3. Continue Keppra  500mg  twice a day  4. Start baby aspirin 81mg  daily  5. Follow-up in 3 months, call for any changes   Seizure Precautions: 1. If medication has been prescribed for you to prevent seizures, take it exactly as directed.  Do not stop taking the medicine without talking to your doctor first, even if you have not had a seizure in a long time.   2. Avoid activities in which a seizure would cause danger to yourself or to others.  Don't operate dangerous machinery, swim alone, or climb in high or dangerous places, such as on ladders, roofs, or girders.  Do not drive unless your doctor says you may.  3. If you have any warning that you may have a seizure, lay down in a safe place where you can't hurt yourself.    4.  No driving for 6 months from last seizure, as per [place]  state law.   Please refer to the following link on the Epile[employer] Foundation of America's website for more information: http://www.epilepsyfoundation.org/answerplace/Social/driving/drivingu.cfm   5.  Maintain good sleep hygiene. Avoid alcohol.  6.  Contact your doctor if you have any problems that may be related to the medicine you are taking.  7.  Call 911 and bring the patient back to the ED if:        A.  The seizure lasts longer than 5 minutes.       B.  The patient doesn't awaken shortly after the seizure  C.  The patient has new problems such as difficulty seeing, speaking or moving  D.  The patient was injured during the seizure  E.  The patient has a temperature over 102 F (39C)  F.  The patient vomited and now is having trouble breathing

## 2024-02-19 ENCOUNTER — Other Ambulatory Visit: Payer: Self-pay | Admitting: Physician Assistant

## 2024-02-19 ENCOUNTER — Other Ambulatory Visit (HOSPITAL_COMMUNITY): Payer: Self-pay

## 2024-02-19 DIAGNOSIS — I10 Essential (primary) hypertension: Secondary | ICD-10-CM

## 2024-02-21 ENCOUNTER — Other Ambulatory Visit (HOSPITAL_COMMUNITY): Payer: Self-pay

## 2024-02-21 MED ORDER — AMLODIPINE BESYLATE 10 MG PO TABS
10.0000 mg | ORAL_TABLET | Freq: Every day | ORAL | 1 refills | Status: AC
  Filled 2024-02-21 – 2024-02-22 (×2): qty 30, 30d supply, fill #0

## 2024-02-22 ENCOUNTER — Other Ambulatory Visit: Payer: Self-pay

## 2024-03-06 ENCOUNTER — Other Ambulatory Visit: Payer: Self-pay | Admitting: Pharmacist

## 2024-03-06 NOTE — Progress Notes (Signed)
 Pharmacy Quality Measure Review  This patient is appearing on a report for the adherence measure for cholesterol (statin) medications this calendar year.   Medication: atorvastatin  Last fill date: 02/22/2024 for 30 day supply  Insurance report was not up to date. No action needed at this time. Reminder set for next refill.   Herlene Fleeta Morris, PharmD, JAQUELINE, CPP Clinical Pharmacist [facility] [phone]

## 2024-03-08 ENCOUNTER — Encounter: Payer: Self-pay | Admitting: Neurology

## 2024-03-10 ENCOUNTER — Ambulatory Visit: Payer: Self-pay | Admitting: Psychology

## 2024-03-10 ENCOUNTER — Ambulatory Visit (INDEPENDENT_AMBULATORY_CARE_PROVIDER_SITE_OTHER): Payer: Self-pay | Admitting: Psychology

## 2024-03-10 DIAGNOSIS — R4189 Other symptoms and signs involving cognitive functions and awareness: Secondary | ICD-10-CM

## 2024-03-10 DIAGNOSIS — F0670 Mild neurocognitive disorder due to known physiological condition without behavioral disturbance: Secondary | ICD-10-CM

## 2024-03-10 NOTE — Progress Notes (Signed)
 "  NEUROPSYCHOLOGICAL EVALUATION [facility]  [facility] of Neurology  Date of Evaluation: 03/10/2024  REASON FOR REFERRAL   Raven Buckley is a 69 year old, left-handed, Black female with 10 years of formal education. She was referred for neuropsychological evaluation by her neurologist, Darice Shivers, M.D., to assess current neurocognitive functioning, document potential cognitive deficits, and assist with treatment planning in the setting of new onset seizure. This is her first neuropsychological evaluation.  SUMMARY OF RESULTS   Premorbid cognitive abilities are estimated to be in the below average range based on word reading and sociodemographic factors. Relative to this baseline estimate, current performance was broadly low, with the exception of scattered isolated strengths.  Working civil service fast streamer, with only one exception, and processing speed were generally below expectations. Of note, on a rapid decoding task, she consistently drew one symbol correctly but always in the wrong orientation.  Performance on tasks of executive functioning was poor, including across measures of alternating attention, abstract reasoning, and phonemic fluency. On a novel problem-solving task, she identified only one solution and lost track of her sorting strategy on one occasion. Semantic fluency and confrontation naming were consistently intact, representing some of her highest scores.  Performance was preserved on a visuoperceptual task but was markedly below expectations on visuoconstructional tasks. Her copies of simple designs were notably sloppy, and she struggled most with copying the simplest of the six designs, the triangle. When copying a complex figure, her approach was piecemeal and lacked appreciation for the overall gestalt, resulting in distortion and misplacement of details.  Memory was largely below expectations across encoding, recall, and recognition trials for both verbal  and visual tasks. Her strongest learning occurred for the word list, and her delayed recall and recognition generally reflected her initial level of learning, even though performance remained normatively low. This pattern was generally consistent across tasks: her retention was relatively adequate compared with her encoding, but because initial learning was limited, overall performance remained low. Notably, her visual memory appeared weaker than her verbal memory, consistent with other nonmemory difficulties observed across testing.  Fine motor abilities were low bilaterally; however, performance on this test may not fully reflect her true abilities. On the first trial with her dominant hand, she attempted to insert the pegs without attending to their shape, despite being instructed to do so. On the second trial with her nondominant hand, she was explicitly reminded to pay attention to the peg shapes, which appeared to improve her performance somewhat.  On self-report questionnaires, she did not endorse significant symptoms of depression or anxiety.  DIAGNOSTIC IMPRESSION   Results of the current evaluation indicate broadly low-to-impaired performance across most cognitive domains, with only a few relative strengths scattered throughout the profile. 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   Overall, the cognitive profile is nonspecific. The widespread nature of observed difficulties limits the ability to attribute the findings to any particular etiology, and no clear lateralizing pattern emerged to clarify seizure-related concerns. One possible contributing factor is the impact of cerebrovascular disease and chronic medical conditions. In addition, Vitamin B12 levels have been historically low. Although recent laboratory results indicate improvement with supplementation, levels remain below  those considered optimal for cognitive functioning.  The potential contribution of marijuana use is also unclear at this time. Patient denied current use during todays evaluation; however, toxicology screening in October was positive for [facility]. She appeared notably sluggish throughout today's  assessment and is not prescribed sedating medications nor reporting significant sleep disturbance, warranting continued consideration of this potential contributing factor.  Finally, test validity warrants consideration. Although the results may reflect genuine cognitive change and there were no overt behavioral indicators of inadequate effort, the possibility of reduced validity cannot be entirely excluded given questionable performance on select performance validity measures.  At this time, the clinical findings are not strongly suggestive of a neurodegenerative process, though this possibility cannot be entirely ruled out.  ICD-10 Codes: F06.70 Mild neurocognitive disorder (mild cognitive impairment)  RECOMMENDATIONS   In consultation with your doctor, schedule cognitive reevaluation on an as-needed basis to assess for cognitive decline and update treatment recommendations. Reevaluation should occur during a period of medical and affective stability.  Given that no one resides in the home who can routinely monitor instrumental activities of daily living, the use of compensatory tools and strategies (e.g., medication reminders) is recommended.  Physical health should be prioritized through balanced nutrition, regular exercise, and adequate sleep. Consistent physical activity supports cardiovascular health, improves mood, and helps preserve mobility and independence. A minimum of 150 minutes of moderate aerobic activity per week (e.g., brisk walking, swimming, or gardening) is recommended. A brain-healthy diet, such as the Mediterranean or MIND diet, is rich in fruits, vegetables, whole grains, healthy fats, and  lean proteins and has been associated with reduced risk of cognitive decline. Additionally, getting adequate, quality sleep and managing chronic conditions with the help of healthcare providers are essential components of healthy aging.  Continued social and cognitive engagement is encouraged. Strong social connections and regular mental stimulation may help protect against cognitive decline. Examples include staying connected with friends and family, volunteering, or participating in community groups. Mentally engaging activities--such as reading, doing puzzles, playing strategy games, or learning a new language or musical instrument--promote brain plasticity. If you are interested in activities to support cognitive engagement, the following resource provides apps to support cognitive engagement:  https://www.[facility]row[facility].org/get-to-know-barrow/centers-programs/neurorehabilitation-center/neuro-rehab-apps-and-games/  Implementation of compensatory strategies is recommended to maximize independence and support daily functioning. Examples include:  Adhering to routine. Compensatory strategies work best when they are used consistently. Use a planner, calendar, or white board that has the schedule and important events for the day clearly listed to reference and cross off when tasks are complete.  Requesting written information. Obtain written materials when information is new, complex, or unfamiliar (e.g., medical instructions). Creating an organized environment. Keep items that can be easily misplaced in a sensible location and get into the habit of always returning the items to those places. Paying attention and reducing distractions. Make a point of focusing attention on information you want to remember. One-on-one interaction is more likely to facilitate attention and minimize distraction. Make eye contact and repeat the information out loud after you hear it. Reduce interruptions or distractions  especially when attempting to learn new information.  Creating associations. When learning something new, think about and understand the information. Explain it in your own words or try to associate it with something you already know. Take notes to help remember important details. Evaluating goals and planning accordingly. When confronted by many different tasks, begin by making a list that prioritizes each task and estimates the time it will take to complete. Break down complicated tasks into smaller, more manageable steps. Focusing on one task at a time and completing each task before starting another. Avoid multitasking.  DISPOSITION   No follow-up neuropsychological testing was scheduled at this time. Please feel free to refer  the patient for repeated evaluation if she shows a significant change in neurocognitive status. She and her brother will be provided verbal feedback in approximately one week regarding the findings and impression during this visit.  The remainder of the report includes the details of the patient's background and a table of results from the current evaluation, which support the summary and recommendations described above.  BACKGROUND   History of Presenting Illness: The following information was obtained from a review of medical records and an interview with the patient and her brother, [name]. Briefly, the patient was recently evaluated by Dr. Georjean at [facility] on 02/18/2024 for new onset seizure. She experienced an episode of unresponsiveness with drooling at a bus stop on 11/30/2023. In the emergency department, she had a generalized tonic-clonic seizure lasting approximately 45 to 60 seconds. She was also admitted in April 2025 for altered mental status after being found down at home with agitation and confusion. Workup at that time was unremarkable, and cannabis was questioned as a possible cause of encephalopathy. Her brother reports to neurology that her memory  has been 5/10 since October, whereas he had no prior concerns. He noted one incident in which she went to work in uniform, forgetting that she had retired. He began managing her finances five years ago but states this was not due to cognitive impairment.   Cognitive Functioning: During today's appointment, the patient reports no significant concerns with her short-term memory. She acknowledges some slowing in processing information and increased difficulty with organization and planning but otherwise denies difficulty with concentration, word finding, and navigation. Her brother notes that since October 2025, he has observed increased forgetfulness, such as misplacing keys, cell phone, and insurance card, but states she typically finds misplaced items. He speculates that this may be related to her moving too fast. He has not observed her repeating herself. He describes an incident where she became confused and attempted to go to work after retirement but highlighted that this only occurred once.  Physical Functioning: Patient reports sleeping well overall, though she may sometimes wake early in the morning to use the restroom or in anticipation of scheduled activities. She describes having good energy during the day. Appetite is stable, with no reported changes in sense of smell or taste. Vision and hearing are also stable. She denies balance problems, falls, and tremors.  Emotional Functioning: Patient describes her recent mood as good and expresses happiness to be alive. She denies suicidal ideation. She enjoys reading and taking the bus around town for a change of pace or to run errands.  Neuroimaging: MRI of the brain (12/01/2023) documented an abnormal signal in the splenium of the corpus callosum, nonspecific but favoring non-acute ischemia. There was also moderately advanced chronic small vessel disease, including small chronic lacunar infarcts in the bilateral deep gray nuclei and a larger  probable chronic lacunar infarct in the dorsal right pons; mild pontine T2 and FLAIR heterogeneity; patchy periventricular T2 and FLAIR heterogeneity with small areas most consistent with chronic white matter lacunar infarcts; and T2* evidence of a solitary chronic microhemorrhage in the left lentiform nucleus  Other Relevant Medical History: Remarkable for hypertension and hyperlipidemia. Please refer to the medical record for a more comprehensive problem list. No history of stroke, CNS infection, or head injury was reported.  Current Medications: Per record, and with verification from her brother, amlodipine , atorvastatin , carvedilol , folic acid , levetiracetam , and vitamin B12.   Functional Status: Patient has never driven but is able to  take the bus without reported difficulty. Her brother fills her pillbox, but she independently remembers to take her medications. He also manages her finances, stating this arrangement is for convenience, as he has access to the necessary information and account numbers; he reported that this responsibility was not assumed due to cognitive impairment. Patient is able to prepare meals and operate household appliances. She remains independent with all basic activities of daily living.  Family Neurological History: Unremarkable.  Psychiatric History: Unremarkable for history of depression, anxiety, prior mental health treatment, suicidal ideation, hallucinations, and psychiatric hospitalizations.  Substance Use History: Patient reports current cigarette use, stating she has reduced from half a pack per day to approximately two cigarettes per day. She denies marijuana use, although a drug screening performed in October 2025 was positive for THC. She also denies current use of alcohol and other illicit substances. There is no reported history of past problematic substance use.  Social and Developmental History: Patient was born in [place], [place]. History of perinatal  complications and developmental delays was not reported. She is single and lives alone. She does not have any children.  Educational and Occupational History: No history of childhood learning disability, special education services, or grade retention was reported. 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 She left school in the Apr 10, 2024 grade after her mother died and ultimately did not return to complete high school or obtain a GED. Prior to her retirement in 2024, she worked at [employer].  BEHAVIORAL OBSERVATIONS   Patient arrived on time and was accompanied by her brother, [name]. She ambulated independently and without gait disturbance. She was alert and fully oriented. She was appropriately groomed and dressed for the setting. No significant motor abnormalities were observed. Vision and hearing were adequate for testing purposes. Speech was of normal rate, prosody, and volume. No conversational word-finding difficulties, paraphasic errors, or dysarthria were observed. Comprehension was conversationally intact. Thought processes were linear, logical, and coherent. Thought content was organized and devoid of delusions. Insight appeared appropriate. Affect was even and congruent with mood. She was cooperative and appeared to put forth adequate effort during testing. Notably, however, her performance on one standalone validity measure was below expectations, though this may have been related to an inefficient task strategy and her known slowed processing speed. Results should be interpreted with caution accordingly.  NEUROPSYCHOLOGICAL TESTING RESULTS   Tests Administered: Animal Naming Test; Brief Visuospatial Memory Test-Revised (BVMT-R) - Form 1; Controlled Oral Word Association Test (COWAT): FAS; Geriatric Anxiety Scale-10 Item (GAS-10); Geriatric Depression Scale Short Form (GDS-SF); Hopkins Verbal Learning Test-Revised (HVLT-R) - From 1; Neuropsychological Assessment Battery (NAB) Form 1 -  Subtest(s): Naming, Visual Discrimination; Rey Complex Figure Test (RCFT); Standalone performance validity test (PVT); Test of Premorbid Functioning (TOPF); Trail Making Test (TMT); Wechsler Adult Intelligence Scale Fifth Edition (WAIS-5) - Subtest(s): Similarities, Clinical Cytogeneticist, Digit Sequencing, Coding, Running Digits, Symbol Search, Symbol Span; Wechsler Memory Scale Fourth Edition (WMS-IV) - Subtest(s): Logical Memory (LM); and [place]  Card Sorting Test 64 Card Version (WCST-64).  Test results are provided in the table below. Whenever possible, the patient's scores were compared against age-, sex-, and education-corrected normative samples. Interpretive descriptions are based on the AACN consensus conference statement on uniform labeling (Guilmette et al., 2020).  PREMORBID FUNCTIONING RAW  RANGE  TOPF 13 StdS=73 Below Average  ATTENTION & WORKING MEMORY RAW  RANGE  WAIS-5 Digit Sequencing -- ss=3 Exceptionally Low  WAIS-5 Running Digits -- ss=6 Low Average  WAIS-5 Symbol Span -- ss=3 Exceptionally  Low  PROCESSING SPEED RAW  RANGE  Trails A 111''0e T=26 Exceptionally Low  WAIS-5 Coding  -- ss=4 Below Average  WAIS-5 Symbol Search -- ss=5 Below Average  EXECUTIVE FUNCTION RAW  RANGE  Trails B D/C -- --  WAIS-5 Similarities -- ss=3 Exceptionally Low  COWAT Letter Fluency 4+2+1 T=27 Exceptionally Low  WCST-64 Total Errors 31 T=38 Low Average  WCST-64 Perseverative Errors 23 T=37 Low Average  WCST-64 Nonperseverative Errors 8 T=48 Average  WCST-64 Categories Completed 1 6-10%ile Below Average to Low Average  WCSR-64 FMS 1 -- --  LANGUAGE RAW  RANGE  COWAT Letter Fluency 4+2+1 T=27 Exceptionally Low  Animal Naming Test 17 T=59 High Average  NAB Naming Test 28/31 T=42 WNL  VISUOSPATIAL RAW  RANGE  WAIS-5 Block Design -- ss=5 Below Average  RCFT Copy 7/36 <1%ile Exceptionally Low  NAB Visual Discrimination 12/18 T=41 Low Average  BVMT-R Copy Trial 8/12 -- BNL  VERBAL LEARNING & MEMORY  RAW  RANGE  HVLT-R Learning Trials (3+8+8)/36 T=41 Low Average  HVLT-R Delayed Recall 5/12 T=37 Low Average  HVLT-R Recognition Hits 9 -- --  HVLT-R Recognition False Positives 1 -- --  HVLT-R Discrimination Index 8 T=36 Below Average  WMS-IV LM-I  (5+6+7)/53 ss=5 Below Average  WMS-IV LM-II  (3+2)/39 ss=4 Below Average  WMS-IV LM Recognition  (5+10)/23 3-9%ile Below Average  VISUAL LEARNING & MEMORY RAW  RANGE  RCFT Copy 7/36 <1%ile Exceptionally Low  RCFT Immediate 2/36 T=20 Exceptionally Low  RCFT Delayed 2.5/36 T=20 Exceptionally Low  RCFT Recognition Total 16/24 T=26 Exceptionally Low  BVMT-R Total Recall (2+3+1)/36 T=23 Exceptionally Low  BVMT-R Delayed Recall 2/12 T=23 Exceptionally Low  BVMT-R Recognition Hits 4 6-10%ile Below Average to Low Average  BVMT-R Recognition False Alarms 4 <1%ile Exceptionally Low  BVMT-R Recognition Discrimination Index 0 <1%ile Exceptionally Low  FINE MOTOR DEXTERITY RAW  RANGE  Grooved Pegboard (Dominant Hand) 284''0d T=29 Exceptionally Low  Grooved Pegboard (Non-Dominant Hand) 210''0d T=30 Below Average  QUESTIONNAIRES RAW  RANGE  GDS-SF 4 -- Minimal  GAS-10 3 -- Minimal  *Note: ss = scaled score; StdS = standard score; T = t-score; C/S = corrected raw score; WNL = within normal limits; BNL= below normal limits; D/C = discontinued. Scores from skewed distributions are typically interpreted as WNL (>=16th %ile) or BNL (<16th %ile).   INFORMED CONSENT   Patient was provided with a verbal description of the nature and purpose of the neuropsychological evaluation. Also reviewed were the foreseeable risks and/or discomforts and benefits of the procedure, limits of confidentiality, and mandatory reporting requirements of this provider. Patient was given the opportunity to have their questions answered. Oral consent to participate was provided by the patient.   This report was prepared as part of a clinical evaluation and is not intended for forensic  use.  SERVICE   This evaluation was conducted by Renda Beckwith, Psy.D. In addition to time spent directly with the patient, total professional time (180 minutes) includes record review, integration of relevant medical history, test selection, interpretation of findings, and report preparation. A technician, Lonell Jude, B.S., provided testing and scoring assistance (150 minutes).  Psychiatric Diagnostic Evaluation Services (Professional): 09208 x 1 Neuropsychological Testing Evaluation Services (Professional): [phone] x 1 Neuropsychological Testing Evaluation Services (Professional): 03866 x 2 Neuropsychological Test Administration and Scoring (Technician): [phone] x 1 Neuropsychological Test Administration and Scoring (Technician): [phone] x 4  This report was generated using voice recognition software. While this document has been carefully reviewed, transcription errors may  be present. I apologize in advance for any inconvenience. Please contact me if further clarification is needed.            Renda Beckwith, Psy.D.             Neuropsychologist   "

## 2024-03-10 NOTE — Progress Notes (Signed)
" ° °  Psychometrician Note   Cognitive testing was administered to Raven Buckley by Raven Buckley, B.S. (psychometrist) under the supervision of Raven Buckley, Psy.D., licensed psychologist on 03/10/2024. Raven Buckley did not appear overtly distressed by the testing session per behavioral observation or responses across self-report questionnaires. Rest breaks were offered.   The battery of tests administered was selected by Raven Buckley, Psy.D. with consideration to Raven Buckley's current level of functioning, the nature of her symptoms, emotional and behavioral responses during interview, level of literacy, observed level of motivation/effort, and the nature of the referral question. This battery was communicated to the psychometrist. Communication between Raven Buckley, Psy.D. and the psychometrist was ongoing throughout the evaluation and Raven Buckley, Psy.D. was immediately accessible at all times. Raven Buckley, Psy.D. provided supervision to the psychometrist on the date of this service to the extent necessary to assure the quality of all services provided.    Raven Buckley will return within approximately 1-2 weeks for an interactive feedback session with Dr. Beckwith at which time her test performances, clinical impressions, and treatment recommendations will be reviewed in detail. Raven Buckley understands she can contact our office should she require our assistance before this time.  A total of 150 minutes of billable time were spent face-to-face with Raven Buckley by the psychometrist. This includes both test administration and scoring time. Billing for these services is reflected in the clinical report generated by Raven Buckley, Psy.D.  This note reflects time spent with the psychometrician and does not include test scores or any clinical interpretations made by Dr. Beckwith. The full report will follow in a separate note. "

## 2024-03-12 ENCOUNTER — Other Ambulatory Visit

## 2024-03-17 ENCOUNTER — Encounter: Payer: Self-pay | Admitting: Psychology

## 2024-05-23 ENCOUNTER — Ambulatory Visit: Payer: Self-pay | Admitting: Neurology

## 2024-06-28 ENCOUNTER — Ambulatory Visit: Admitting: Family Medicine
